# Patient Record
Sex: Male | Born: 1935
Health system: Southern US, Community
[De-identification: ages and names within clinical notes are randomized; demographics above are authoritative.]

## PROBLEM LIST (undated history)

## (undated) DIAGNOSIS — K219 Gastro-esophageal reflux disease without esophagitis: Secondary | ICD-10-CM

## (undated) DIAGNOSIS — G629 Polyneuropathy, unspecified: Secondary | ICD-10-CM

## (undated) DIAGNOSIS — M1990 Unspecified osteoarthritis, unspecified site: Secondary | ICD-10-CM

## (undated) DIAGNOSIS — I2510 Atherosclerotic heart disease of native coronary artery without angina pectoris: Secondary | ICD-10-CM

## (undated) DIAGNOSIS — N529 Male erectile dysfunction, unspecified: Secondary | ICD-10-CM

## (undated) DIAGNOSIS — C801 Malignant (primary) neoplasm, unspecified: Secondary | ICD-10-CM

## (undated) DIAGNOSIS — R319 Hematuria, unspecified: Secondary | ICD-10-CM

## (undated) DIAGNOSIS — E119 Type 2 diabetes mellitus without complications: Secondary | ICD-10-CM

## (undated) DIAGNOSIS — D649 Anemia, unspecified: Secondary | ICD-10-CM

## (undated) DIAGNOSIS — R42 Dizziness and giddiness: Secondary | ICD-10-CM

## (undated) DIAGNOSIS — E785 Hyperlipidemia, unspecified: Secondary | ICD-10-CM

## (undated) DIAGNOSIS — I219 Acute myocardial infarction, unspecified: Secondary | ICD-10-CM

## (undated) DIAGNOSIS — I509 Heart failure, unspecified: Secondary | ICD-10-CM

## (undated) DIAGNOSIS — N189 Chronic kidney disease, unspecified: Secondary | ICD-10-CM

## (undated) DIAGNOSIS — W57XXXA Bitten or stung by nonvenomous insect and other nonvenomous arthropods, initial encounter: Secondary | ICD-10-CM

## (undated) DIAGNOSIS — R339 Retention of urine, unspecified: Secondary | ICD-10-CM

## (undated) DIAGNOSIS — M75100 Unspecified rotator cuff tear or rupture of unspecified shoulder, not specified as traumatic: Secondary | ICD-10-CM

## (undated) DIAGNOSIS — N400 Enlarged prostate without lower urinary tract symptoms: Secondary | ICD-10-CM

## (undated) DIAGNOSIS — G459 Transient cerebral ischemic attack, unspecified: Secondary | ICD-10-CM

## (undated) DIAGNOSIS — R310 Gross hematuria: Secondary | ICD-10-CM

## (undated) DIAGNOSIS — R3129 Other microscopic hematuria: Secondary | ICD-10-CM

## (undated) DIAGNOSIS — N5319 Other ejaculatory dysfunction: Secondary | ICD-10-CM

## (undated) DIAGNOSIS — I10 Essential (primary) hypertension: Secondary | ICD-10-CM

## (undated) DIAGNOSIS — R351 Nocturia: Secondary | ICD-10-CM

## (undated) DIAGNOSIS — IMO0001 Reserved for inherently not codable concepts without codable children: Secondary | ICD-10-CM

## (undated) DIAGNOSIS — I519 Heart disease, unspecified: Secondary | ICD-10-CM

## (undated) HISTORY — PX: ROTATOR CUFF REPAIR: SHX139

## (undated) HISTORY — PX: OTHER SURGICAL HISTORY: SHX169

## (undated) HISTORY — DX: Other ejaculatory dysfunction: N53.19

## (undated) HISTORY — DX: Retention of urine, unspecified: R33.9

## (undated) HISTORY — DX: Benign prostatic hyperplasia without lower urinary tract symptoms: N40.0

## (undated) HISTORY — DX: Nocturia: R35.1

## (undated) HISTORY — DX: Anemia, unspecified: D64.9

## (undated) HISTORY — DX: Type 2 diabetes mellitus without complications: E11.9

## (undated) HISTORY — DX: Hyperlipidemia, unspecified: E78.5

## (undated) HISTORY — DX: Male erectile dysfunction, unspecified: N52.9

## (undated) HISTORY — PX: CORONARY ANGIOPLASTY: SHX604

## (undated) HISTORY — DX: Reserved for inherently not codable concepts without codable children: IMO0001

## (undated) HISTORY — DX: Essential (primary) hypertension: I10

## (undated) HISTORY — DX: Unspecified rotator cuff tear or rupture of unspecified shoulder, not specified as traumatic: M75.100

## (undated) HISTORY — DX: Other microscopic hematuria: R31.29

## (undated) HISTORY — DX: Heart disease, unspecified: I51.9

## (undated) HISTORY — DX: Gross hematuria: R31.0

## (undated) HISTORY — DX: Hematuria, unspecified: R31.9

## (undated) HISTORY — DX: Heart failure, unspecified: I50.9

---

## 2006-03-16 ENCOUNTER — Inpatient Hospital Stay: Payer: Self-pay | Admitting: Cardiovascular Disease

## 2006-03-16 ENCOUNTER — Other Ambulatory Visit: Payer: Self-pay

## 2006-04-08 ENCOUNTER — Encounter: Payer: Self-pay | Admitting: Cardiovascular Disease

## 2006-04-18 ENCOUNTER — Encounter: Payer: Self-pay | Admitting: Cardiovascular Disease

## 2006-05-19 ENCOUNTER — Encounter: Payer: Self-pay | Admitting: Cardiovascular Disease

## 2006-06-18 ENCOUNTER — Encounter: Payer: Self-pay | Admitting: Cardiovascular Disease

## 2006-06-25 ENCOUNTER — Ambulatory Visit: Payer: Self-pay | Admitting: Gastroenterology

## 2007-08-05 ENCOUNTER — Ambulatory Visit: Payer: Self-pay | Admitting: Cardiovascular Disease

## 2007-08-12 ENCOUNTER — Inpatient Hospital Stay: Payer: Self-pay | Admitting: Cardiovascular Disease

## 2007-08-12 ENCOUNTER — Other Ambulatory Visit: Payer: Self-pay

## 2009-01-17 ENCOUNTER — Ambulatory Visit: Payer: Self-pay | Admitting: Gastroenterology

## 2011-04-22 ENCOUNTER — Ambulatory Visit: Payer: Self-pay | Admitting: Rheumatology

## 2011-06-20 ENCOUNTER — Ambulatory Visit: Payer: Self-pay | Admitting: Unknown Physician Specialty

## 2011-06-20 LAB — BASIC METABOLIC PANEL
Anion Gap: 6 — ABNORMAL LOW (ref 7–16)
Calcium, Total: 9.2 mg/dL (ref 8.5–10.1)
Chloride: 104 mmol/L (ref 98–107)
Co2: 28 mmol/L (ref 21–32)
Creatinine: 0.84 mg/dL (ref 0.60–1.30)
EGFR (African American): >60
EGFR (Non-African Amer.): >60
Osmolality: 285 (ref 275–301)
Potassium: 4.3 mmol/L (ref 3.5–5.1)

## 2011-06-20 LAB — CBC
HCT: 38.6 % — ABNORMAL LOW (ref 40.0–52.0)
HGB: 13.5 g/dL (ref 13.0–18.0)
MCH: 33.5 pg (ref 26.0–34.0)
MCHC: 34.9 g/dL (ref 32.0–36.0)
Platelet: 192 10*3/uL (ref 150–440)
RBC: 4.01 10*6/uL — ABNORMAL LOW (ref 4.40–5.90)
RDW: 13 % (ref 11.5–14.5)

## 2011-06-23 ENCOUNTER — Ambulatory Visit: Payer: Self-pay | Admitting: Unknown Physician Specialty

## 2012-04-16 ENCOUNTER — Ambulatory Visit: Payer: Self-pay | Admitting: Unknown Physician Specialty

## 2012-11-11 ENCOUNTER — Ambulatory Visit: Payer: Self-pay | Admitting: Urology

## 2013-10-21 ENCOUNTER — Ambulatory Visit (INDEPENDENT_AMBULATORY_CARE_PROVIDER_SITE_OTHER): Payer: Medicare Other | Admitting: Podiatry

## 2013-10-21 ENCOUNTER — Ambulatory Visit (INDEPENDENT_AMBULATORY_CARE_PROVIDER_SITE_OTHER): Payer: Medicare Other

## 2013-10-21 ENCOUNTER — Encounter: Payer: Self-pay | Admitting: Podiatry

## 2013-10-21 VITALS — BP 150/83 | HR 61 | Resp 16 | Ht 68.0 in | Wt 170.0 lb

## 2013-10-21 DIAGNOSIS — M722 Plantar fascial fibromatosis: Secondary | ICD-10-CM

## 2013-10-21 DIAGNOSIS — L923 Foreign body granuloma of the skin and subcutaneous tissue: Secondary | ICD-10-CM

## 2013-10-21 MED ORDER — TRIAMCINOLONE ACETONIDE 10 MG/ML IJ SUSP: 10.0000 mg | Freq: Once | INTRAMUSCULAR | Status: DC

## 2013-10-21 NOTE — Progress Notes (Signed)
   Subjective:    Patient ID: Gregory Tran, male    DOB: 1935/06/16, 78 y.o.   MRN: [id number]  HPI Comments: i had a blister in my arch on the left foot. Its tender with pressure. Its been bothering me for a couple of months. My wife used a needle on it to see if she could find anything. Its remained the same, no worse.   Foot Pain      Review of Systems  HENT:       Ringing in ears  Endocrine: Positive for cold intolerance.       Increase urination   Genitourinary: Positive for urgency and difficulty urinating.  Musculoskeletal:       Back pain Muscle pain   Neurological: Positive for light-headedness.  Hematological: Bruises/bleeds easily.  All other systems reviewed and are negative.      Objective:   Physical Exam        Assessment & Plan:

## 2013-10-21 NOTE — Progress Notes (Signed)
Subjective:     Patient ID: Gregory Tran, male   DOB: Jun 19, 1935, 78 y.o.   MRN: [id number]  Foot Pain   patient states that he had a small blister in the bottom of his left foot and arch that did fine but it's left him with some pain in the arch and it's been present for several months. There's been no drainage redness or swelling   Review of Systems  All other systems reviewed and are negative.      Objective:   Physical Exam  Nursing note and vitals reviewed. Constitutional: He is oriented to person, place, and time.  Cardiovascular: Intact distal pulses.   Musculoskeletal: Normal range of motion.  Neurological: He is oriented to person, place, and time.  Skin: Skin is warm.   neurovascular status intact with muscle strength adequate and range of motion within normal limits. I checked and found that the digits are well-perfused and that the sharp dull and vibratory is intact area patient is noted to have discomfort in the left arch in the mid arch area with a small area where there had been some kind of trauma but there is no drainage edema erythema surrounding    Assessment:     Probable plantar fasciitis and cannot rule out foreign body    Plan:     H&P and x-ray reviewed and today I did a careful steroidal injection 2 mg Kenalog 5 mg Xylocaine and advised on physical therapy for the area. If pain should persist or other problems patient is to let us know

## 2014-03-14 ENCOUNTER — Ambulatory Visit: Payer: Self-pay | Admitting: Internal Medicine

## 2014-04-10 DIAGNOSIS — M5440 Lumbago with sciatica, unspecified side: Secondary | ICD-10-CM | POA: Insufficient documentation

## 2014-04-25 ENCOUNTER — Ambulatory Visit: Payer: Self-pay | Admitting: Unknown Physician Specialty

## 2014-06-11 NOTE — Op Note (Signed)
PATIENT NAME:  Gregory Tran, Gregory Tran MR#:  [id number] DATE OF BIRTH:  Jun 29, 1935  DATE OF PROCEDURE:  06/23/2011  PREOPERATIVE DIAGNOSIS: Torn rotator cuff, right shoulder, with bicipital tendinitis.  POSTOPERATIVE DIAGNOSES: Torn rotator cuff, right shoulder, with bicipital tendinitis and secondary impingement and degenerative labral tear.   PROCEDURES: Arthroscopic subacromial decompression, release of the long head of the biceps tendon, and debridement of the posterior labral tear followed by mini incision rotator cuff repair and xenograft augmentation   SURGEON: Kathrene Alu., M.D.   ANESTHESIA: General.   HISTORY: The patient had a fairly long history of right shoulder pain and weakness. Plain films did not reveal any significant abnormality but a MRI was consistent with a large rotator cuff tear. The long head of the biceps tendon was also thought to be frayed. The patient was ultimately brought in for surgery due to his persistent symptoms.   DESCRIPTION OF PROCEDURE: The patient was taken the Operating Room where satisfactory general anesthesia was achieved. The patient was turned to the lateral decubitus position with the right shoulder up. The right shoulder was prepped and draped in the usual fashion for a procedure about the shoulder. The right upper extremity was suspended with the Acufex shoulder suspension device. We used 10 pounds of traction initially and then reduced it to 5 later on during the procedure.   The patient incidentally was given 2 grams Kefzol IV prior to the start of the procedure.   The scope was then introduced through a posterior portal into the glenohumeral joint. The joint was distended with lactated Ringer's. We used the Mitek fluid pump to facilitate joint distention.   Inspection of the glenohumeral joint revealed the articular surfaces were reasonably smooth. The labrum was frayed posteriorly. There was fraying of the long head of the biceps  tendon. A fairly significant rotator cuff tear was appreciated in the area of the supraspinatus and infraspinatus.   An anterior portal was established from outside in. I introduced an ArthroCare saber wand through this portal and used it to divide the long head of the biceps tendon attachment to the labrum. I also introduced a shaver to debride the frayed undersurface of the rotator cuff. I then switched the scope anteriorly and brought a shaver in through the posterior portal to debride the frayed posterior labral tear.   The scope was then switched back to the posterior portal and introduced in the glenohumeral joint. Inspection of this joint revealed a large cuff tear that involved the supraspinatus and infraspinatus. A lateral portal was established. I introduced a shaver to debride the thickened bursal tissue and the torn cuff itself. I also debrided the frayed tissue on the undersurface of the acromion. The patient did have a bony prominence on the inferior aspect of the anterior acromion. I used an ArthroCare wand to further debride the greater tuberosity and the undersurface of the acromion. In regard to the greater tuberosity a large round bur was used to lightly decorticate this area and then I switched the scope to the lateral portal and brought in an acromionizer bur through the posterior portal and performed a subacromial decompression. The acromial attachment of the coracoacromial ligament was divided at this time.   I then went ahead and freed the rotator cuff from the surrounding soft tissue. Superiorly, I freed it with an elevator and inferiorly I used a Naval architect wand. I still could not advance the cuff as far laterally as I wanted so I went ahead  and removed the scope and enlarged the lateral puncture wound proximally to the edge of the acromion. I divided the deltoid muscle in line with the incision and in line with its fibers. A portion of the anterior deltoid was dissected off  of the acromion and a portion of the posterior deltoid was also dissected off the acromion. A Gelpi retractor was inserted. The patient indeed had a large cuff tear that was significantly retracted.   I ultimately was able to perform a margin convergence repair using several #2 Magnum wire sutures. I then placed two additional sutures into the repaired cuff in inverted horizontal mattress fashion.Two speed screws were inserted and, the inverted mattress sutures were tightened down to them. I then used a small K wire to make multiple holes in the greater tuberosity in a microfracture-type fashion. I then reinforced the repair with a Conexa Xenograft. It was anchored medially into the cuff remnant with two #2 nonabsorbable sutures and laterally it was anchored to the greater tuberosity with two ArthroCare Spartan anchors with #2 sutures that were passed through the cuff in horizontal mattress fashion.   The wound was irrigated with GU irrigant. I reattached the anterior deltoid back to the acromion through a drill hole with a #1 Ethibond suture. The rotator interval was closed with 0 Vicryl, the subcutaneous with 2-0 Vicryl, and the skin with skin staples. The posterior and anterior puncture wounds were closed with 3-0 nylon in vertical mattress fashion. Several milliliters of 0.5% Marcaine with epinephrine was injected about each puncture wound and then 5 to 6 mL were injected into the incision and then another 7 or 8 mL were injected into the subacromial space.   A total of about 30 mL of 0.5% Marcaine with epinephrine was used.   Betadine was applied to the wounds followed by 4 TENS pads that were placed about the wound. Sterile dressing was applied followed by an immobilizer.   The patient was then turned supine and awakened. He was transferred to a stretcher bed. He was taken to the recovery room in satisfactory condition. Blood loss was negligible.  ____________________________ Kathrene Alu., MD hbk:slb D: 06/24/2011 13:13:43 ET T: 06/24/2011 13:59:59 ET JOB#: [id number]  cc: Kathrene Alu., MD, <Dictator> Vilinda Flake, Brooke Bonito MD ELECTRONICALLY SIGNED 07/28/2011 18:16

## 2014-10-05 ENCOUNTER — Encounter: Payer: Self-pay | Admitting: *Deleted

## 2014-10-16 ENCOUNTER — Encounter: Payer: Self-pay | Admitting: Urology

## 2014-10-16 ENCOUNTER — Ambulatory Visit (INDEPENDENT_AMBULATORY_CARE_PROVIDER_SITE_OTHER): Payer: Medicare Other | Admitting: Urology

## 2014-10-16 VITALS — BP 163/72 | HR 73 | Ht 68.0 in | Wt 176.0 lb

## 2014-10-16 DIAGNOSIS — N401 Enlarged prostate with lower urinary tract symptoms: Secondary | ICD-10-CM | POA: Diagnosis not present

## 2014-10-16 DIAGNOSIS — N138 Other obstructive and reflux uropathy: Secondary | ICD-10-CM | POA: Insufficient documentation

## 2014-10-16 DIAGNOSIS — N508 Other specified disorders of male genital organs: Secondary | ICD-10-CM

## 2014-10-16 DIAGNOSIS — N5319 Other ejaculatory dysfunction: Secondary | ICD-10-CM

## 2014-10-16 LAB — BLADDER SCAN AMB NON-IMAGING

## 2014-10-16 MED ORDER — TADALAFIL 5 MG PO TABS: 5.0000 mg | ORAL_TABLET | Freq: Every day | ORAL | Status: DC | PRN

## 2014-10-16 NOTE — Progress Notes (Signed)
10/16/2014 8:13 PM   Gregory Tran 03-18-1935 [id number]  Referring provider: No referring provider defined for this encounter.  Chief Complaint  Patient presents with  . Benign Prostatic Hypertrophy    4month with Uroflow &PVR    HPI: Patient is a 79 year old white male who presents today for a 4 month follow-up. At his previous visits with Dr. Erlene Quan, he underwent cystoscopy and TRUS. He was found to have an obstructive appearing prostate with trilobar coaptation and a median lobe.  They had discussed a possible outlet procedure with HoLEP/HoLAP, but he is hesitant to undergo surgery.  His IPSS score today is 17, which is moderate lower urinary tract symptomatology. He feels terrible with his quality life due to his urinary symptoms. His PVR is 0 mL.   His previous PVR is 187 mL.    His major complaint today having to sit to urinate due to the spraying of his urinary stream and a dry ejaculate. He has had these symptoms for several months.  He denies any dysuria, hematuria or suprapubic pain.   He currently taking tamsulosin and finasteride.  He also denies any recent fevers, chills, nausea or vomiting.  He does not have a family history of PCa.      IPSS      10/16/14 1400       International Prostate Symptom Score   How often have you had the sensation of not emptying your bladder? Less than half the time     How often have you had to urinate less than every two hours? Less than half the time     How often have you found you stopped and started again several times when you urinated? Less than half the time     How often have you found it difficult to postpone urination? About half the time     How often have you had a weak urinary stream? More than half the time     How often have you had to strain to start urination? Less than 1 in 5 times     How many times did you typically get up at night to urinate? 3 Times     Total IPSS Score 17     Quality of Life  due to urinary symptoms   If you were to spend the rest of your life with your urinary condition just the way it is now how would you feel about that? Terrible        Score:  1-7 Mild 8-19 Moderate 20-35 Severe      PMH: Past Medical History  Diagnosis Date  . Heart disease   . Hematuria   . Diabetes mellitus   . HTN (hypertension)   . Hyperlipidemia   . Frequency   . BPH (benign prostatic hyperplasia)   . Microscopic hematuria   . Ejaculatory disorder   . Nocturia   . Gross hematuria   . Incomplete bladder emptying   . Erectile dysfunction     Surgical History: Past Surgical History  Procedure Laterality Date  . Rotator cuff repair Bilateral   . Cardiac stents      Home Medications:    Medication List       This list is accurate as of: 10/16/14  8:13 PM.  Always use your most recent med list.               amLODipine 5 MG tablet  Commonly known as:  NORVASC  TK 1 T  PO ONCE A DAY     aspirin EC 81 MG tablet  Take by mouth.     atorvastatin 20 MG tablet  Commonly known as:  LIPITOR  Take by mouth.     clopidogrel 75 MG tablet  Commonly known as:  PLAVIX  Take by mouth.     finasteride 5 MG tablet  Commonly known as:  PROSCAR     IRON PO  Take by mouth daily.     lisinopril 10 MG tablet  Commonly known as:  PRINIVIL,ZESTRIL  Take by mouth.     metFORMIN 1000 MG tablet  Commonly known as:  GLUCOPHAGE  Take by mouth.     metoprolol succinate 50 MG 24 hr tablet  Commonly known as:  TOPROL-XL  Take by mouth.     NOVOLOG FLEXPEN 100 UNIT/ML FlexPen  Generic drug:  insulin aspart  INJ 10 UNITS Aurora TID     pantoprazole 40 MG tablet  Commonly known as:  PROTONIX     tadalafil 5 MG tablet  Commonly known as:  CIALIS  Take 1 tablet (5 mg total) by mouth daily as needed for erectile dysfunction.     tamsulosin 0.4 MG Caps capsule  Commonly known as:  FLOMAX  Take by mouth.        Allergies: No Known Allergies  Family  History: Family History  Problem Relation Age of Onset  . Ovarian cancer Sister   . Bladder Cancer Neg Hx   . Prostate cancer Neg Hx     Social History:  reports that he has been smoking.  He does not have any smokeless tobacco history on file. He reports that he drinks alcohol. His drug history is not on file.  ROS: UROLOGY Frequent Urination?: Yes Hard to postpone urination?: Yes Burning/pain with urination?: No Get up at night to urinate?: Yes Leakage of urine?: Yes Urine stream starts and stops?: Yes Trouble starting stream?: Yes Do you have to strain to urinate?: No Blood in urine?: No Urinary tract infection?: No Sexually transmitted disease?: No Injury to kidneys or bladder?: No Painful intercourse?: No Weak stream?: Yes Erection problems?: No Penile pain?: No  Gastrointestinal Nausea?: No Vomiting?: No Indigestion/heartburn?: No Diarrhea?: No Constipation?: No  Constitutional Fever: No Night sweats?: No Weight loss?: No Fatigue?: No  Skin Skin rash/lesions?: No Itching?: No  Eyes Blurred vision?: No Double vision?: No  Ears/Nose/Throat Sore throat?: No Sinus problems?: No  Hematologic/Lymphatic Swollen glands?: No Easy bruising?: Yes  Cardiovascular Leg swelling?: No Chest pain?: No  Respiratory Cough?: No Shortness of breath?: No  Endocrine Excessive thirst?: No  Musculoskeletal Back pain?: Yes Joint pain?: No  Neurological Headaches?: No Dizziness?: No  Psychologic Depression?: No Anxiety?: No  Physical Exam: BP 163/72 mmHg  Pulse 73  Ht 5\' 8"  (1.727 m)  Wt 176 lb (79.833 kg)  BMI 26.77 kg/m2  GU: Patient with uncircumcised phallus. Foreskin easily retracted  Urethral meatus is patent.  No penile discharge. No penile lesions or rashes. Scrotum without lesions, cysts, rashes and/or edema.  Testicles are located scrotally bilaterally. No masses are appreciated in the testicles. Left and right epididymis are  normal. Rectal: Patient with  normal sphincter tone. Perineum without scarring or rashes. No rectal masses are appreciated. Prostate is approximately 55  grams, no nodules are appreciated. Seminal vesicles are normal.   Laboratory Data:  Lab Results  Component Value Date   CREATININE 0.84 06/20/2011    PSA history:  2.6 ng/mL on 10/27/2012  0.7 ng/mL on 07/27/2013             0.6 ng/mL on 01/26/2014   Pertinent Imaging: Uroflow is consistent with an obstructive pattern.   Q-max= 13.0 mL/s                                                                                  Q-ave= 9.5 mL/s                                                                                  Voided volume=254.0 mL                                                                                  T total= 32.2 s                                                                                  T flow= 26.6 s                                                                                  T Qmax= 5.7 s                          Results for orders placed or performed in visit on 10/16/14  BLADDER SCAN AMB NON-IMAGING  Result Value Ref Range   Scan Result 28ml      Assessment & Plan:    1. BPH (benign prostatic hyperplasia) with LUTS:   Patient's IPSS score is 17/6.  His PVR 0 mL.  His DRE demonstrates enlargement, no nodules.  Patient is very irritated with his symptom of dry ejaculate. He  realizes its from the tamsulosin, but he is wondering if any other medications could be used for his BPH and obstructive voiding symptoms.  I have suggested Cialis 5 mg, but I advised him that he may find it cost prohibitive  as most insurance plans are reluctant to cover this medication.  He is warned not to take the Cialis with medications that contain nitrates.  I also advised him of the side effects, such as: headache, flushing, dyspepsia, abnormal vision, nasal congestion, back pain, myalgia, nausea, dizziness, and  rash.   He would like to try the Cialis.  I have sent a prescription in to his pharmacy and have given him a coupon to help with the cost of the medication. He will follow up in 4 months for a  DRE, PVR and an IPSS.    - PR COMPLEX UROFLOWMETRY - BLADDER SCAN AMB NON-IMAGING  2. Ejaculatory disorder:   The patient is experiencing the side effect of ejaculatory disorder with his tamsulosin medication. His PVR is 0 mL today, so I believe the tamsulosin is aiding in the emptying of his bladder.   I will have the patient discontinue the tamsulosin and start the Cialis 5 mg daily.  This will hopefully continue to aid in the emptying of the bladder with out the side effects of a dry ejaculate.  He will follow-up in 4 months time for symptom recheck and PVR.   Return in about 4 months (around 02/15/2015) for IPSS and PVR .  Zara Council, [facility] [address] [place], [place] [address] [phone]

## 2014-10-16 NOTE — Progress Notes (Signed)
Uroflow  Peak Flow: 13.22ml Average Flow: 9.27ml Voided Volume: 236ml Voiding Time: 32.2sec Flow Time: 26.6sec Time to Peak Flow: 5.7sec  PVR Volume: 46ml

## 2015-01-21 ENCOUNTER — Other Ambulatory Visit: Payer: Self-pay | Admitting: Urology

## 2015-01-21 DIAGNOSIS — N400 Enlarged prostate without lower urinary tract symptoms: Secondary | ICD-10-CM

## 2015-01-23 DIAGNOSIS — M25519 Pain in unspecified shoulder: Secondary | ICD-10-CM | POA: Insufficient documentation

## 2015-01-26 ENCOUNTER — Telehealth: Payer: Self-pay

## 2015-01-26 DIAGNOSIS — N400 Enlarged prostate without lower urinary tract symptoms: Secondary | ICD-10-CM

## 2015-01-26 MED ORDER — FINASTERIDE 5 MG PO TABS: 5.0000 mg | ORAL_TABLET | Freq: Every day | ORAL | Status: DC

## 2015-01-26 NOTE — Telephone Encounter (Signed)
Pt pharmacy sent refill request on finasteride. A 79mo refill was given to last until jan appt.

## 2015-02-20 ENCOUNTER — Telehealth: Payer: Self-pay | Admitting: Urology

## 2015-02-20 ENCOUNTER — Ambulatory Visit (INDEPENDENT_AMBULATORY_CARE_PROVIDER_SITE_OTHER): Payer: Medicare Other | Admitting: Urology

## 2015-02-20 ENCOUNTER — Encounter: Payer: Self-pay | Admitting: Urology

## 2015-02-20 VITALS — BP 156/70 | HR 58 | Ht 68.0 in | Wt 178.0 lb

## 2015-02-20 DIAGNOSIS — R4189 Other symptoms and signs involving cognitive functions and awareness: Secondary | ICD-10-CM | POA: Diagnosis not present

## 2015-02-20 DIAGNOSIS — N401 Enlarged prostate with lower urinary tract symptoms: Secondary | ICD-10-CM

## 2015-02-20 DIAGNOSIS — N138 Other obstructive and reflux uropathy: Secondary | ICD-10-CM

## 2015-02-20 LAB — BLADDER SCAN AMB NON-IMAGING: SCAN RESULT: 98

## 2015-02-20 MED ORDER — TADALAFIL 5 MG PO TABS: 5.0000 mg | ORAL_TABLET | Freq: Every day | ORAL | Status: DC | PRN

## 2015-02-20 NOTE — Telephone Encounter (Signed)
Patient has changed PCP's.  He no longer sees Dr. Rosario Jacks.  He has moved to [facility] in [place].  His new PCP,  [name].  Would you please send his note from today's visit (02/20/2015) to her?  And can you change it in his chart?

## 2015-02-20 NOTE — Progress Notes (Signed)
9:20 AM   Gregory Tran 1935-12-18 [id number]  Referring provider: Casilda Carls, MD [address]   [place], [place] [address]  Chief Complaint  Patient presents with  . Benign Prostatic Hypertrophy    follow up    HPI: Patient is a 80 year old Caucasian male with BPH with LUTS who presets today for a follow up after a trial of Cialis 5 mg daily.    Previous history Patient underwent a cystoscopy and TRUS in 05/2014 with Dr. Erlene Quan.  He was found to have an obstructive appearing prostate with trilobar coaptation and a median lobe.  They had discussed a possible outlet procedure with HoLEP/HoLAP, but he is hesitant to undergo surgery.  His IPSS score today is 22, which is severe lower urinary tract symptomatology. He feels mostly dissatisfied with his quality life due to his urinary symptoms. His PVR is 98 mL.   His major complaint today is urinary frequency.   He has had these symptoms for several months.  He denies any dysuria, hematuria or suprapubic pain.  He currently taking tamsulosin and finasteride, he did not pick up the Cialis prescription.  He also denies any recent fevers, chills, nausea or vomiting.  He does not have a family history of PCa.      IPSS      02/20/15 0900       International Prostate Symptom Score   How often have you had the sensation of not emptying your bladder? Less than half the time     How often have you had to urinate less than every two hours? About half the time     How often have you found you stopped and started again several times when you urinated? About half the time     How often have you found it difficult to postpone urination? More than half the time     How often have you had a weak urinary stream? More than half the time     How often have you had to strain to start urination? More than half the time     How many times did you typically get up at night to urinate? 2 Times     Total IPSS Score 22     Quality of Life  due to urinary symptoms   If you were to spend the rest of your life with your urinary condition just the way it is now how would you feel about that? Mostly Disatisfied        Score:  1-7 Mild 8-19 Moderate 20-35 Severe  Patient exhibitive some cognitive issues during our appointment today.  He states the reason he did not pick up the Cialis is that it was not called in to the pharmacy.  I did check the records and it was escribed to his pharmacy.  He asked me about topics he thought we discussed during his visit which we did not.    PMH: Past Medical History  Diagnosis Date  . Heart disease   . Hematuria   . Diabetes mellitus ([place])   . HTN (hypertension)   . Hyperlipidemia   . Frequency   . BPH (benign prostatic hyperplasia)   . Microscopic hematuria   . Ejaculatory disorder   . Nocturia   . Gross hematuria   . Incomplete bladder emptying   . Erectile dysfunction     Surgical History: Past Surgical History  Procedure Laterality Date  . Rotator cuff repair Bilateral   .  Cardiac stents      Home Medications:    Medication List       This list is accurate as of: 02/20/15  9:20 AM.  Always use your most recent med list.               amLODipine 5 MG tablet  Commonly known as:  NORVASC  TK 1 T PO ONCE A DAY     aspirin EC 81 MG tablet  Take by mouth.     atorvastatin 20 MG tablet  Commonly known as:  LIPITOR  Take by mouth.     clopidogrel 75 MG tablet  Commonly known as:  PLAVIX  Take by mouth.     finasteride 5 MG tablet  Commonly known as:  PROSCAR  Take 1 tablet (5 mg total) by mouth daily.     IRON PO  Take by mouth daily.     LEVEMIR FLEXTOUCH 100 UNIT/ML Pen  Generic drug:  Insulin Detemir  INJECT 35 UNITS QAM AND 15 UNITS QPM     levocetirizine 5 MG tablet  Commonly known as:  XYZAL  Take by mouth. Reported on 02/20/2015     lisinopril 10 MG tablet  Commonly known as:  PRINIVIL,ZESTRIL  Take by mouth. Reported on 02/20/2015      metFORMIN 1000 MG tablet  Commonly known as:  GLUCOPHAGE  Take by mouth.     metoprolol succinate 50 MG 24 hr tablet  Commonly known as:  TOPROL-XL  Take by mouth.     NOVOLOG FLEXPEN 100 UNIT/ML FlexPen  Generic drug:  insulin aspart  INJ 10 UNITS [place] TID     pantoprazole 40 MG tablet  Commonly known as:  PROTONIX     tadalafil 5 MG tablet  Commonly known as:  CIALIS  Take 1 tablet (5 mg total) by mouth daily as needed for erectile dysfunction.     tamsulosin 0.4 MG Caps capsule  Commonly known as:  FLOMAX  Take by mouth.     vitamin B-1 250 MG tablet  Take by mouth.        Allergies:  Allergies  Allergen Reactions  . B Complex Formula 1     Family History: Family History  Problem Relation Age of Onset  . Ovarian cancer Sister   . Bladder Cancer Neg Hx   . Prostate cancer Neg Hx   . Kidney disease Brother     born one kidney    Social History:  reports that he has been smoking.  He does not have any smokeless tobacco history on file. He reports that he drinks alcohol. He reports that he does not use illicit drugs.  ROS: UROLOGY Frequent Urination?: No Hard to postpone urination?: Yes Burning/pain with urination?: No Get up at night to urinate?: Yes Leakage of urine?: No Urine stream starts and stops?: No Trouble starting stream?: Yes Do you have to strain to urinate?: No Blood in urine?: No Urinary tract infection?: No Sexually transmitted disease?: No Injury to kidneys or bladder?: No Painful intercourse?: No Weak stream?: No Erection problems?: No Penile pain?: No  Gastrointestinal Nausea?: No Vomiting?: No Indigestion/heartburn?: No Diarrhea?: No Constipation?: No  Constitutional Fever: No Night sweats?: No Weight loss?: No Fatigue?: No  Skin Skin rash/lesions?: No Itching?: No  Eyes Blurred vision?: No Double vision?: No  Ears/Nose/Throat Sore throat?: No Sinus problems?: No  Hematologic/Lymphatic Swollen glands?:  No Easy bruising?: No  Cardiovascular Leg swelling?: No Chest pain?: No  Respiratory Cough?:  No Shortness of breath?: No  Endocrine Excessive thirst?: No  Musculoskeletal Back pain?: No Joint pain?: No  Neurological Headaches?: No Dizziness?: No  Psychologic Depression?: No Anxiety?: No  Physical Exam: BP 156/70 mmHg  Pulse 58  Ht 5\' 8"  (1.727 m)  Wt 178 lb (80.74 kg)  BMI 27.07 kg/m2  GU: Patient with uncircumcised phallus. Foreskin easily retracted  Urethral meatus is patent.  No penile discharge. No penile lesions or rashes. Scrotum without lesions, cysts, rashes and/or edema.  Testicles are located scrotally bilaterally. Left spermatocele is noted.   Left and right epididymis are normal. Rectal: Patient with  normal sphincter tone. Perineum without scarring or rashes. No rectal masses are appreciated. Prostate is approximately 55  grams, no nodules are appreciated. Seminal vesicles are normal.   Laboratory Data:  Lab Results  Component Value Date   CREATININE 0.84 06/20/2011    PSA history:  2.6 ng/mL on 10/27/2012             0.7 ng/mL on 07/27/2013             0.6 ng/mL on 01/26/2014   Pertinent Imaging:                      Results for orders placed or performed in visit on 02/20/15  BLADDER SCAN AMB NON-IMAGING  Result Value Ref Range   Scan Result 98      Assessment & Plan:    1. BPH (benign prostatic hyperplasia) with LUTS:   Patient's IPSS score is 22/4.  His PVR 98 mL.  His DRE demonstrates enlargement, no nodules.  Patient is very irritated with his symptoms of urinary frequency.    I explained to him that he was on maximum medical therapy with the tamsulosin and the finasteride.  He would like to try the Cialis 5 mg, but I advised him that he may find it cost prohibitive as most insurance plans are reluctant to cover this medication.  He is warned not to take the Cialis with medications that contain nitrates.  I also advised him of the side  effects, such as: headache, flushing, dyspepsia, abnormal vision, nasal congestion, back pain, myalgia, nausea, dizziness, and rash.    I have sent a prescription in to his pharmacy.  He will follow up in one month for an IPSS and PVR.   - BLADDER SCAN AMB NON-IMAGING  2. Cognitive change:   Patient demonstrated some memory issues during our appointment today.  He has a new PCP, Juluis Rainier, FNP,  at [facility] and we will send this note to her so she may be able to evaluate it further.    Return in about 1 month (around 03/23/2015) for PVR and IPSS.  Zara Council, [facility] [address], [place], [place] [address] [phone]

## 2015-03-06 NOTE — Telephone Encounter (Signed)
Done ° ° °Gregory Tran °

## 2015-03-06 NOTE — Telephone Encounter (Signed)
Patient has changed PCP's. He no longer sees Dr. Rosario Jacks. He has moved to [facility] in [place]. His new PCP, [name]. Would you please send his note from today's visit (02/20/2015) to her? And can you change it in his chart?

## 2015-03-23 ENCOUNTER — Ambulatory Visit (INDEPENDENT_AMBULATORY_CARE_PROVIDER_SITE_OTHER): Payer: Medicare Other | Admitting: Urology

## 2015-03-23 ENCOUNTER — Encounter: Payer: Self-pay | Admitting: Urology

## 2015-03-23 VITALS — BP 170/82 | HR 61 | Ht 68.0 in | Wt 179.4 lb

## 2015-03-23 DIAGNOSIS — N401 Enlarged prostate with lower urinary tract symptoms: Secondary | ICD-10-CM | POA: Diagnosis not present

## 2015-03-23 DIAGNOSIS — N138 Other obstructive and reflux uropathy: Secondary | ICD-10-CM

## 2015-03-23 DIAGNOSIS — R4189 Other symptoms and signs involving cognitive functions and awareness: Secondary | ICD-10-CM | POA: Diagnosis not present

## 2015-03-23 LAB — BLADDER SCAN AMB NON-IMAGING: SCAN RESULT: 88

## 2015-03-23 NOTE — Progress Notes (Signed)
9:12 AM   Gregory Tran 11-15-1935 [id number]  Referring provider: Casilda Carls, MD [address]   [place], [place] [address]  Chief Complaint  Patient presents with  . Benign Prostatic Hypertrophy    follow up 1 month     HPI: Patient is a 80 year old Caucasian male with BPH with LUTS who presets today for a follow up after a trial of Cialis 5 mg daily.    Previous history Patient underwent a cystoscopy and TRUS in 05/2014 with Dr. Erlene Quan.  He was found to have an obstructive appearing prostate with trilobar coaptation and a median lobe.  They had discussed a possible outlet procedure with HoLEP/HoLAP, but he is hesitant to undergo surgery.  His IPSS score today is 26, which is severe lower urinary tract symptomatology. He feels mostly dissatisfied with his quality life due to his urinary symptoms. His PVR is 88 mL.   His major complaint today is urinary frequency.   He has had these symptoms for several months.  He denies any dysuria, hematuria or suprapubic pain.  He currently taking tamsulosin and finasteride, he did not pick up the Cialis prescription.  His insurance would not cover the medication. He also denies any recent fevers, chills, nausea or vomiting.  He does not have a family history of PCa.      IPSS      02/20/15 0900 03/23/15 0900     International Prostate Symptom Score   How often have you had the sensation of not emptying your bladder? Less than half the time About half the time    How often have you had to urinate less than every two hours? About half the time Almost always    How often have you found you stopped and started again several times when you urinated? About half the time About half the time    How often have you found it difficult to postpone urination? More than half the time Almost always    How often have you had a weak urinary stream? More than half the time Almost always    How often have you had to strain to start urination?  More than half the time About half the time    How many times did you typically get up at night to urinate? 2 Times 2 Times    Total IPSS Score 22 26    Quality of Life due to urinary symptoms   If you were to spend the rest of your life with your urinary condition just the way it is now how would you feel about that? Mostly Disatisfied Mostly Disatisfied       Score:  1-7 Mild 8-19 Moderate 20-35 Severe  Patient is still exhibiting some cognitive issues during our appointment today.  He did attempt to pick up the Cialis prescription, but he found that cost prohibitive. He stated he contacted our office regarding this issue.   He would like to have an appointment with Dr. Erlene Quan to discuss a possible bladder outlet procedure for his urinary symptoms.   PMH: Past Medical History  Diagnosis Date  . Heart disease   . Hematuria   . Diabetes mellitus ([place])   . HTN (hypertension)   . Hyperlipidemia   . Frequency   . BPH (benign prostatic hyperplasia)   . Microscopic hematuria   . Ejaculatory disorder   . Nocturia   . Gross hematuria   . Incomplete bladder emptying   . Erectile dysfunction  Surgical History: Past Surgical History  Procedure Laterality Date  . Rotator cuff repair Bilateral   . Cardiac stents      Home Medications:    Medication List       This list is accurate as of: 03/23/15  9:12 AM.  Always use your most recent med list.               amLODipine 5 MG tablet  Commonly known as:  NORVASC  TK 1 T PO ONCE A DAY     aspirin EC 81 MG tablet  Take by mouth.     atorvastatin 20 MG tablet  Commonly known as:  LIPITOR  Take by mouth.     clopidogrel 75 MG tablet  Commonly known as:  PLAVIX  Take by mouth.     finasteride 5 MG tablet  Commonly known as:  PROSCAR  Take 1 tablet (5 mg total) by mouth daily.     IRON PO  Take by mouth daily.     LEVEMIR FLEXTOUCH 100 UNIT/ML Pen  Generic drug:  Insulin Detemir  INJECT 35 UNITS QAM AND 15  UNITS QPM     levocetirizine 5 MG tablet  Commonly known as:  XYZAL  Take by mouth. Reported on 02/20/2015     lisinopril 10 MG tablet  Commonly known as:  PRINIVIL,ZESTRIL  Take by mouth. Reported on 02/20/2015     metFORMIN 1000 MG tablet  Commonly known as:  GLUCOPHAGE  Take by mouth.     metoprolol succinate 50 MG 24 hr tablet  Commonly known as:  TOPROL-XL  Take by mouth.     NOVOLOG FLEXPEN 100 UNIT/ML FlexPen  Generic drug:  insulin aspart  INJ 10 UNITS [place] TID     pantoprazole 40 MG tablet  Commonly known as:  PROTONIX     tadalafil 5 MG tablet  Commonly known as:  CIALIS  Take 1 tablet (5 mg total) by mouth daily as needed for erectile dysfunction.     tamsulosin 0.4 MG Caps capsule  Commonly known as:  FLOMAX  Take by mouth.     vitamin B-1 250 MG tablet  Take by mouth. Reported on 03/23/2015        Allergies:  Allergies  Allergen Reactions  . B Complex Formula 1     Family History: Family History  Problem Relation Age of Onset  . Ovarian cancer Sister   . Bladder Cancer Neg Hx   . Prostate cancer Neg Hx   . Kidney disease Brother     born one kidney    Social History:  reports that he has been smoking.  He does not have any smokeless tobacco history on file. He reports that he drinks alcohol. He reports that he does not use illicit drugs.  ROS: UROLOGY Frequent Urination?: Yes Hard to postpone urination?: Yes Burning/pain with urination?: No Get up at night to urinate?: Yes Leakage of urine?: Yes Urine stream starts and stops?: Yes Trouble starting stream?: Yes Do you have to strain to urinate?: No Blood in urine?: No Urinary tract infection?: No Sexually transmitted disease?: No Injury to kidneys or bladder?: No Painful intercourse?: No Weak stream?: Yes Erection problems?: No Penile pain?: No  Gastrointestinal Nausea?: No Vomiting?: No Indigestion/heartburn?: No Diarrhea?: No Constipation?: No  Constitutional Fever: No Night  sweats?: No Weight loss?: No Fatigue?: No  Skin Skin rash/lesions?: No Itching?: No  Eyes Blurred vision?: No Double vision?: No  Ears/Nose/Throat Sore throat?: No Sinus  problems?: No  Hematologic/Lymphatic Swollen glands?: No Easy bruising?: No  Cardiovascular Leg swelling?: No Chest pain?: No  Respiratory Cough?: No Shortness of breath?: No  Endocrine Excessive thirst?: No  Musculoskeletal Back pain?: No Joint pain?: No  Neurological Headaches?: No Dizziness?: No  Psychologic Depression?: Yes Anxiety?: No  Physical Exam: BP 170/82 mmHg  Pulse 61  Ht 5\' 8"  (1.727 m)  Wt 179 lb 6.4 oz (81.375 kg)  BMI 27.28 kg/m2  Constitutional: Well nourished. Alert and oriented, No acute distress. HEENT: [place] AT, moist mucus membranes. Trachea midline, no masses. Cardiovascular: No clubbing, cyanosis, or edema. Respiratory: Normal respiratory effort, no increased work of breathing. Skin: No rashes, bruises or suspicious lesions. Lymph: No cervical or inguinal adenopathy. Neurologic: Grossly intact, no focal deficits, moving all 4 extremities. Psychiatric: Normal mood and affect.  Laboratory Data:  Lab Results  Component Value Date   CREATININE 0.84 06/20/2011    PSA history:  2.6 ng/mL on 10/27/2012             0.7 ng/mL on 07/27/2013             0.6 ng/mL on 01/26/2014   Pertinent Imaging:                 Results for orders placed or performed in visit on 03/23/15  BLADDER SCAN AMB NON-IMAGING  Result Value Ref Range   Scan Result 88      Assessment & Plan:    1. BPH (benign prostatic hyperplasia) with LUTS:   Patient's IPSS score is 26/4.  His PVR 88 mL.   Patient is very irritated with his symptoms of urinary frequency.    I explained to him that he was on maximum medical therapy with the tamsulosin and the finasteride.  He found the Cialis 5 mg daily cost prohibitive.  He would like an appointment with Dr. Erlene Quan to discuss a possible bladder  outlet procedure.  - BLADDER SCAN AMB NON-IMAGING  2. Cognitive change:   Patient demonstrated some memory issues during our appointment today.  He has a new PCP, Juluis Rainier, FNP,  at [facility] clinic and we will send this note to her so she may be able to evaluate it further.  His notes were sent to her office on 03/06/2015.  Return for Patient would like an appointment with Dr. Erlene Quan to discuss HoLEP.  Zara Council, [facility] [address], [place], [place] [address] [phone]

## 2015-03-26 DIAGNOSIS — M7542 Impingement syndrome of left shoulder: Secondary | ICD-10-CM | POA: Insufficient documentation

## 2015-03-27 ENCOUNTER — Other Ambulatory Visit: Payer: Self-pay | Admitting: Unknown Physician Specialty

## 2015-03-27 DIAGNOSIS — M7542 Impingement syndrome of left shoulder: Secondary | ICD-10-CM

## 2015-04-06 ENCOUNTER — Ambulatory Visit
Admission: RE | Admit: 2015-04-06 | Discharge: 2015-04-06 | Disposition: A | Discharge status: Home / Self Care | Payer: Medicare Other | Source: Ambulatory Visit | Attending: Unknown Physician Specialty | Admitting: Unknown Physician Specialty

## 2015-04-06 DIAGNOSIS — M67814 Other specified disorders of tendon, left shoulder: Secondary | ICD-10-CM | POA: Insufficient documentation

## 2015-04-06 DIAGNOSIS — M25512 Pain in left shoulder: Secondary | ICD-10-CM | POA: Diagnosis present

## 2015-04-06 DIAGNOSIS — M7542 Impingement syndrome of left shoulder: Secondary | ICD-10-CM | POA: Diagnosis present

## 2015-04-06 DIAGNOSIS — M75112 Incomplete rotator cuff tear or rupture of left shoulder, not specified as traumatic: Secondary | ICD-10-CM | POA: Insufficient documentation

## 2015-04-10 DIAGNOSIS — M720 Palmar fascial fibromatosis [Dupuytren]: Secondary | ICD-10-CM | POA: Insufficient documentation

## 2015-05-02 ENCOUNTER — Ambulatory Visit (INDEPENDENT_AMBULATORY_CARE_PROVIDER_SITE_OTHER): Payer: Medicare Other | Admitting: Urology

## 2015-05-02 ENCOUNTER — Encounter: Payer: Self-pay | Admitting: Urology

## 2015-05-02 ENCOUNTER — Ambulatory Visit: Payer: Medicare Other | Admitting: Urology

## 2015-05-02 VITALS — BP 180/109 | HR 88 | Ht 68.0 in | Wt 180.0 lb

## 2015-05-02 DIAGNOSIS — N138 Other obstructive and reflux uropathy: Secondary | ICD-10-CM

## 2015-05-02 DIAGNOSIS — K219 Gastro-esophageal reflux disease without esophagitis: Secondary | ICD-10-CM | POA: Insufficient documentation

## 2015-05-02 DIAGNOSIS — D509 Iron deficiency anemia, unspecified: Secondary | ICD-10-CM | POA: Insufficient documentation

## 2015-05-02 DIAGNOSIS — N401 Enlarged prostate with lower urinary tract symptoms: Secondary | ICD-10-CM

## 2015-05-02 DIAGNOSIS — N3281 Overactive bladder: Secondary | ICD-10-CM

## 2015-05-02 DIAGNOSIS — E785 Hyperlipidemia, unspecified: Secondary | ICD-10-CM | POA: Insufficient documentation

## 2015-05-02 DIAGNOSIS — I10 Essential (primary) hypertension: Secondary | ICD-10-CM | POA: Diagnosis not present

## 2015-05-02 DIAGNOSIS — I2510 Atherosclerotic heart disease of native coronary artery without angina pectoris: Secondary | ICD-10-CM | POA: Insufficient documentation

## 2015-05-02 DIAGNOSIS — E119 Type 2 diabetes mellitus without complications: Secondary | ICD-10-CM | POA: Insufficient documentation

## 2015-05-02 DIAGNOSIS — E1165 Type 2 diabetes mellitus with hyperglycemia: Secondary | ICD-10-CM | POA: Insufficient documentation

## 2015-05-02 DIAGNOSIS — N400 Enlarged prostate without lower urinary tract symptoms: Secondary | ICD-10-CM | POA: Insufficient documentation

## 2015-05-02 DIAGNOSIS — M545 Low back pain, unspecified: Secondary | ICD-10-CM | POA: Insufficient documentation

## 2015-05-02 DIAGNOSIS — G8929 Other chronic pain: Secondary | ICD-10-CM | POA: Insufficient documentation

## 2015-05-02 NOTE — Progress Notes (Signed)
05/02/2015 1:43 PM   [place] Nov 25, 1935 [id number]  Referring provider: Sallee Lange, NP [place], [place] [address]  Chief Complaint  Patient presents with  . Benign Prostatic Hypertrophy    discuss HOLEP    HPI: 80 year old male with refractory BPH with lots who presents today to reassess his symptoms.  He has been on finasteride and Flomax for quite some time.  He was also prescribed Cialis daily but never able to fill this medication due to cost.  He's never been on a deck ownership medications. He is a diabetic.   He continues to have quite severe mixed urinary symptoms including obstructive and irritative voiding symptoms. This includes slow stream, incomplete bladder emptying, urinary urgency, frequency, and nocturia 2-3.  No gross hematuria or UTIs.    PVR last visit 88 mL.  TRUS volume 45 cc on 05/2014.  Cystoscopy  Showed obstructing appearing prostate with trilobar coaptation and a median lobe on retroflexion.  Most recent PSA 0.6 ng/dL on 01/26/2014.  Most recent IPSS, 26, bother 6  PMH: Past Medical History  Diagnosis Date  . Heart disease   . Hematuria   . Diabetes mellitus ([place])   . HTN (hypertension)   . Hyperlipidemia   . Frequency   . BPH (benign prostatic hyperplasia)   . Microscopic hematuria   . Ejaculatory disorder   . Nocturia   . Gross hematuria   . Incomplete bladder emptying   . Erectile dysfunction   . Rotator cuff tear left    Surgical History: Past Surgical History  Procedure Laterality Date  . Rotator cuff repair Bilateral   . Cardiac stents      Home Medications:    Medication List       This list is accurate as of: 05/02/15  1:43 PM.  Always use your most recent med list.               amLODipine 5 MG tablet  Commonly known as:  NORVASC  TK 1 T PO ONCE A DAY     aspirin EC 81 MG tablet  Take by mouth.     clopidogrel 75 MG tablet    Commonly known as:  PLAVIX  Take by mouth.     finasteride 5 MG tablet  Commonly known as:  PROSCAR  Take 1 tablet (5 mg total) by mouth daily.     LEVEMIR FLEXTOUCH 100 UNIT/ML Pen  Generic drug:  Insulin Detemir  INJECT 35 UNITS QAM AND 15 UNITS QPM     metFORMIN 1000 MG tablet  Commonly known as:  GLUCOPHAGE  Take by mouth.     metoprolol succinate 50 MG 24 hr tablet  Commonly known as:  TOPROL-XL  Take by mouth.     NOVOLOG FLEXPEN 100 UNIT/ML FlexPen  Generic drug:  insulin aspart  INJ 10 UNITS [place] TID     pantoprazole 40 MG tablet  Commonly known as:  PROTONIX     rosuvastatin 5 MG tablet  Commonly known as:  CRESTOR     tamsulosin 0.4 MG Caps capsule  Commonly known as:  FLOMAX  Take by mouth.        Allergies:  Allergies  Allergen Reactions  . B Complex Formula 1     Family History: Family History  Problem Relation Age of Onset  . Ovarian cancer Sister   . Bladder Cancer Neg Hx   . Prostate cancer Neg Hx   .  Kidney disease Brother     born one kidney    Social History:  reports that he has been smoking.  He does not have any smokeless tobacco history on file. He reports that he drinks alcohol. He reports that he does not use illicit drugs.  ROS: UROLOGY Frequent Urination?: Yes Hard to postpone urination?: Yes Burning/pain with urination?: No Get up at night to urinate?: Yes Leakage of urine?: No Urine stream starts and stops?: No Trouble starting stream?: Yes Do you have to strain to urinate?: No Blood in urine?: No Urinary tract infection?: No Sexually transmitted disease?: No Injury to kidneys or bladder?: No Painful intercourse?: No Weak stream?: Yes Erection problems?: No Penile pain?: No  Gastrointestinal Nausea?: No Vomiting?: No Indigestion/heartburn?: No Diarrhea?: No Constipation?: No  Constitutional Fever: No Night sweats?: No Weight loss?: No Fatigue?: No  Skin Skin rash/lesions?: No Itching?:  No  Eyes Blurred vision?: No Double vision?: No  Ears/Nose/Throat Sore throat?: No Sinus problems?: No  Hematologic/Lymphatic Swollen glands?: No Easy bruising?: No  Cardiovascular Leg swelling?: No Chest pain?: No  Respiratory Cough?: No Shortness of breath?: No  Endocrine Excessive thirst?: No  Musculoskeletal Back pain?: No Joint pain?: No  Neurological Headaches?: No Dizziness?: No  Psychologic Depression?: No Anxiety?: No  Physical Exam: BP 180/109 mmHg  Pulse 88  Ht 5\' 8"  (1.727 m)  Wt 180 lb (81.647 kg)  BMI 27.38 kg/m2  Constitutional:  Alert and oriented, No acute distress. HEENT: [place] AT, moist mucus membranes.  Trachea midline, no masses. Cardiovascular: No clubbing, cyanosis, or edema. Respiratory: Normal respiratory effort, no increased work of breathing. GI: Abdomen is soft, nontender, nondistended, no abdominal masses Skin: No rashes, bruises or suspicious lesions. Neurologic: Grossly intact, no focal deficits, moving all 4 extremities. Psychiatric: Normal mood and affect.  Laboratory Data: Lab Results  Component Value Date   WBC 5.7 06/20/2011   HGB 13.5 06/20/2011   HCT 38.6* 06/20/2011   MCV 96 06/20/2011   PLT 192 06/20/2011    Lab Results  Component Value Date   CREATININE 0.84 06/20/2011    Assessment & Plan:  80 year old male with refractory obstructive and irritative voiding symptoms despite maximal medical therapy with finasteride and Flomax. We discussed various options today including proceeding to the operating room for an outlet procedure versus addition of anticholinergic/beta 3 medication for overactive symptoms versus  further workup with urodynamics. He does have an obstructing appearing the lobe on cystoscopy but overall prostate size is not exceptionally large. As such, uanble to guarantee that his irritative voiding symptoms will improve with reduction of his outlet.  It may be very beneficial to obtain urodynamics  to assess the degree of outlet obstruction and overactivity for further planning. Discuss UDS at length today with the patient. He is agreeable with this and would like to proceed. He will follow-up in my office once UDS is complete.  1. BPH with obstruction/lower urinary tract symptoms As above   2. OAB (overactive bladder) As above - Ambulatory referral to Urology  3. Essential hypertension Patient fairly hypertensive today in the office but otherwise asymptomatic. I have asked him to follow up with his PCP for this.     Return for f/u Dr. Erlene Quan after UDS.  Hollice Espy, MD  [facility] [address] [place], [place] [address] [phone]

## 2015-05-23 ENCOUNTER — Other Ambulatory Visit: Payer: Self-pay | Admitting: Urology

## 2015-06-07 ENCOUNTER — Encounter: Payer: Self-pay | Admitting: Urology

## 2015-06-07 ENCOUNTER — Ambulatory Visit (INDEPENDENT_AMBULATORY_CARE_PROVIDER_SITE_OTHER): Payer: Medicare Other | Admitting: Urology

## 2015-06-07 ENCOUNTER — Telehealth: Payer: Self-pay | Admitting: Radiology

## 2015-06-07 VITALS — BP 166/73 | HR 60 | Ht 68.0 in | Wt 180.0 lb

## 2015-06-07 DIAGNOSIS — N401 Enlarged prostate with lower urinary tract symptoms: Secondary | ICD-10-CM | POA: Diagnosis not present

## 2015-06-07 DIAGNOSIS — N3281 Overactive bladder: Secondary | ICD-10-CM | POA: Diagnosis not present

## 2015-06-07 DIAGNOSIS — N4340 Spermatocele of epididymis, unspecified: Secondary | ICD-10-CM

## 2015-06-07 DIAGNOSIS — N138 Other obstructive and reflux uropathy: Secondary | ICD-10-CM

## 2015-06-07 LAB — MICROSCOPIC EXAMINATION: Bacteria, UA: NONE SEEN

## 2015-06-07 LAB — URINALYSIS, COMPLETE
Bilirubin, UA: NEGATIVE
Glucose, UA: NEGATIVE
Ketones, UA: NEGATIVE
Leukocytes, UA: NEGATIVE
NITRITE UA: NEGATIVE
PH UA: 5.5 (ref 5.0–7.5)
Protein, UA: NEGATIVE
RBC, UA: NEGATIVE
Specific Gravity, UA: 1.01 (ref 1.005–1.030)
Urobilinogen, Ur: 0.2 mg/dL (ref 0.2–1.0)

## 2015-06-07 NOTE — Telephone Encounter (Signed)
Notified pt's wife of appt made with Dr Ubaldo Glassing at [facility] in [place] on 06/14/15 @4 :00. Advised pt that Dr Bethanne Ginger office requests last EKG & office notes from his previous cardiologist & he will need to go to that office to sign a medical records release. Arbie Cookey voices understanding.

## 2015-06-07 NOTE — Patient Instructions (Signed)
Transurethral Resection of the Prostate Transurethral resection of the prostate (TURP) is the removal of part of your prostate to treat noncancerous (benign) prostatic hyperplasia (BPH). BPH typically occurs in men older than 40 years. It is the abnormal growth of cells in your prostate. Specifically, it is an abnormal increase in the number of cells that make up your prostate tissue. This causes an increase in the size of your prostate. Often, in the case of BPH, the prostate becomes so large that it compresses the tube that drains urine out of your body from your bladder (urethra). Eventually, this compression can obstruct the flow of urine from your bladder. This obstruction can cause recurrent bladder infection and difficulties with bladder control and bladder emptying. The goal of TURP is to remove enough prostate tissue to allow for an unobstructed flow of urine, which often resolves the associated conditions. LET YOUR CAREGIVER KNOW ABOUT:  Any allergies you have.  Any medicines you are taking, including herbs, eye drops, over-the-counter medicines, and creams.  Any problems you have had with the use of anesthetics.  Any blood disorders you have, including bleeding problems and clotting problems.  Previous surgeries you have had.  Any prostate infections you have had. RISKS AND COMPLICATIONS Generally, TURP is a safe procedure. However, as with any surgical procedure, complications can occur. Possible complications associated with TURP include:  Difficulty getting an erection.  Scarring, which may cause problems with the flow of your urine.  Injury to your urethra.  Incontinence from injury to the muscle that surrounds your prostate, which controls urine flow.  Infection.  Bleeding.  Injury to your bladder (rare). BEFORE THE PROCEDURE  Your caregiver will tell you when you need to stop eating and drinking. If you take any medicines, your caregiver will tell you which ones you  may keep taking and which ones you will have to stop taking and when.  Just before the procedure you will also receive medicine to make you fall asleep (general anesthetic). This will be given through a tube that is inserted into one of your veins (intravenous [IV] tube). PROCEDURE Your surgeon inserts an instrument that is similar to a telescope with an electric cutting edge (resectoscope) through your urethra to the area of the prostate gland. The cutting edge is used to remove enlarged pieces of your prostate, one piece at a time. At the end of your procedure, a flexible tube (catheter) will be inserted into your urethra to drain your bladder. Special plastic bags filled with solution will be connected to the end of the catheter. The solution will be used to irrigate blood from your bladder while you heal.  AFTER THE PROCEDURE You will be taken to the recovery area. Once you are awake, stable, and taking fluids well, you will be taken to your hospital room. Typically, you will stay in the hospital 1-2 days after this procedure. The catheter usually is removed before discharge from the hospital.   This information is not intended to replace advice given to you by your health care provider. Make sure you discuss any questions you have with your health care provider.   Document Released: 02/03/2005 Document Revised: 02/24/2014 Document Reviewed: 07/07/2011 Elsevier Interactive Patient Education [employer].

## 2015-06-07 NOTE — Progress Notes (Signed)
10:26 AM  06/07/2015   Gregory Tran 1935-04-08 [id number]  Referring provider: Sallee Lange, NP [place], [place] [address]   HPI: 80 year old male with refractory BPH with LUTS  who presents today for f/u UDS results.    He has been on finasteride and Flomax for quite some time.  He was also prescribed Cialis daily but never able to fill this medication due to cost.  He's never been on OAB medications. He is a diabetic.   He continues to have quite severe mixed urinary symptoms including obstructive and irritative voiding symptoms. This includes slow stream, incomplete bladder emptying, urinary urgency, frequency, and nocturia 2-3.  No gross hematuria or UTIs.    PVR last visit 88 mL.  TRUS volume 45 cc on 05/2014.  Cystoscopy showed obstructing appearing prostate with trilobar coaptation and a median lobe on retroflexion.  Most recent PSA 0.6 ng/dL on 01/26/2014.  Most recent IPSS, 26, bother 6  Urodynamics performed on 05/23/2015 at [facility] urology are consistent with bladder outlet obstruction. His first sensation was 120 mL's, normal desire to void at 252, strong desire at 397 mL's. He did have some unstable bladder contractions but no leakage. He was able to inhibit these unstable contractions. Pressure flow study indicate a voluntary contraction with a max flow rate of 5 and most perspective, detrusor pressure at max flow was 55 cm of water at which time he voided 127 cc with a postvoid residual of 185 cc. Calculated bladder outlet obstructive index was 44.8, within the obstructed range. EMG was normal. On fluoroscopy, there is evidence of elevation of the bladder base without reflux.  PMH: Past Medical History  Diagnosis Date  . Heart disease   . Hematuria   . Diabetes mellitus ([place])   . HTN (hypertension)   . Hyperlipidemia   . Frequency   . BPH (benign prostatic hyperplasia)   . Microscopic  hematuria   . Ejaculatory disorder   . Nocturia   . Gross hematuria   . Incomplete bladder emptying   . Erectile dysfunction   . Rotator cuff tear left    Surgical History: Past Surgical History  Procedure Laterality Date  . Rotator cuff repair Bilateral   . Cardiac stents      Home Medications:    Medication List       This list is accurate as of: 06/07/15 10:26 AM.  Always use your most recent med list.               amLODipine 5 MG tablet  Commonly known as:  NORVASC  TK 1 T PO ONCE A DAY     aspirin EC 81 MG tablet  Take by mouth.     clopidogrel 75 MG tablet  Commonly known as:  PLAVIX  Take by mouth.     finasteride 5 MG tablet  Commonly known as:  PROSCAR  Take 1 tablet (5 mg total) by mouth daily.     LEVEMIR FLEXTOUCH 100 UNIT/ML Pen  Generic drug:  Insulin Detemir  INJECT 35 UNITS QAM AND 15 UNITS QPM     lisinopril 10 MG tablet  Commonly known as:  PRINIVIL,ZESTRIL  TK 1 T PO QD     metFORMIN 1000 MG tablet  Commonly known as:  GLUCOPHAGE  Take by mouth.     metoprolol succinate 50 MG 24 hr tablet  Commonly known as:  TOPROL-XL  Take by mouth.  NOVOLOG FLEXPEN 100 UNIT/ML FlexPen  Generic drug:  insulin aspart  INJ 10 UNITS [place] TID     pantoprazole 40 MG tablet  Commonly known as:  PROTONIX     rosuvastatin 5 MG tablet  Commonly known as:  CRESTOR     tamsulosin 0.4 MG Caps capsule  Commonly known as:  FLOMAX  Take by mouth.        Allergies:  Allergies  Allergen Reactions  . B Complex Formula 1     Family History: Family History  Problem Relation Age of Onset  . Ovarian cancer Sister   . Bladder Cancer Neg Hx   . Prostate cancer Neg Hx   . Kidney disease Brother     born one kidney    Social History:  reports that he has been smoking.  He does not have any smokeless tobacco history on file. He reports that he drinks alcohol. He reports that he does not use illicit drugs.  ROS: UROLOGY Frequent Urination?:  Yes Hard to postpone urination?: Yes Burning/pain with urination?: No Get up at night to urinate?: Yes Leakage of urine?: Yes Urine stream starts and stops?: Yes Trouble starting stream?: Yes Do you have to strain to urinate?: No Blood in urine?: No Urinary tract infection?: No Sexually transmitted disease?: No Injury to kidneys or bladder?: No Painful intercourse?: No Weak stream?: Yes Erection problems?: No Penile pain?: No  Gastrointestinal Nausea?: No Vomiting?: No Indigestion/heartburn?: No Diarrhea?: No Constipation?: No  Constitutional Fever: No Night sweats?: No Weight loss?: No Fatigue?: No  Skin Skin rash/lesions?: No Itching?: No  Eyes Blurred vision?: No Double vision?: No  Ears/Nose/Throat Sore throat?: No Sinus problems?: No  Hematologic/Lymphatic Swollen glands?: No Easy bruising?: Yes  Cardiovascular Leg swelling?: No Chest pain?: No  Respiratory Cough?: No Shortness of breath?: No  Endocrine Excessive thirst?: No  Musculoskeletal Back pain?: No Joint pain?: No  Neurological Headaches?: No Dizziness?: Yes  Psychologic Depression?: No Anxiety?: No  Physical Exam: BP 166/73 mmHg  Pulse 60  Ht 5\' 8"  (1.727 m)  Wt 180 lb (81.647 kg)  BMI 27.38 kg/m2  Constitutional:  Alert and oriented, No acute distress. HEENT: [place] AT, moist mucus membranes.  Trachea midline, no masses. Cardiovascular: No clubbing, cyanosis, or edema. RRR. Respiratory: Normal respiratory effort, no increased work of breathing.  CTAB. GI: Abdomen is soft, nontender, nondistended, no abdominal masses GU: Bilateral descended testicles. Left spermatocele noted, approximately 3 cm. Skin: No rashes, bruises or suspicious lesions. Neurologic: Grossly intact, no focal deficits, moving all 4 extremities. Psychiatric: Normal mood and affect.  Laboratory Data: Lab Results  Component Value Date   WBC 5.7 06/20/2011   HGB 13.5 06/20/2011   HCT 38.6* 06/20/2011    MCV 96 06/20/2011   PLT 192 06/20/2011    Lab Results  Component Value Date   CREATININE 0.84 06/20/2011    Assessment & Plan:  80 year old male with refractory obstructive and irritative voiding symptoms despite maximal medical therapy with finasteride and Flomax. UDS confirms bladder outlet obstruction with some mild bladder instability.  Lengthy discussion today about his various options. I do feel that he would benefit from an outlet procedure at this point. Again, unable to guarantee that this irritative voiding symptoms will improve or resolve completely.    Risks and benefits of TURP were reviewed today in detail. The preoperative, intraoperative, postoperative course were discussed. Risk of bleeding, infection, damage to surrounding structures, retrograde ejaculation, stress urinary incontinence were discussed in detail.  All  this questions were answered. He would like to proceed with TURP.  1. BPH with obstruction/lower urinary tract symptoms As above   Will need cardiac clearance from to stop plavix/ ASA (history of cardiac stents).  If unable to come off ASA, may consider HoLAP to reduce risk of bleeding.    2. OAB (overactive bladder)  As above  3. Left spermatocele Asymptomatic.  No intervention recommended.  Schedule above procedure   Hollice Espy, MD  [facility] [address], [place], [place] [address] [phone]  I spent 30 min with this patient of which greater than 50% was spent in counseling and coordination of care with the patient.

## 2015-06-08 NOTE — Telephone Encounter (Signed)
Notified pt of surgery scheduled 07/09/15, pre-admit testing appt on 5/8 @11 :15 and to call Friday prior to surgery for arrival time to SDS. Pt will be notified when to stop ASA 81mg  & Plavix after his appt with Dr Ubaldo Glassing. Pt & wife voice understanding.

## 2015-06-10 LAB — CULTURE, URINE COMPREHENSIVE

## 2015-06-14 DIAGNOSIS — I10 Essential (primary) hypertension: Secondary | ICD-10-CM | POA: Insufficient documentation

## 2015-06-19 ENCOUNTER — Other Ambulatory Visit: Payer: Self-pay | Admitting: Urology

## 2015-06-20 ENCOUNTER — Other Ambulatory Visit: Payer: Self-pay

## 2015-06-20 DIAGNOSIS — N400 Enlarged prostate without lower urinary tract symptoms: Secondary | ICD-10-CM

## 2015-06-20 MED ORDER — FINASTERIDE 5 MG PO TABS: 5.0000 mg | ORAL_TABLET | Freq: Every day | ORAL | Status: DC

## 2015-06-25 ENCOUNTER — Encounter
Admission: RE | Admit: 2015-06-25 | Discharge: 2015-06-25 | Disposition: A | Discharge status: Home / Self Care | Payer: Medicare Other | Source: Ambulatory Visit | Attending: Urology | Admitting: Urology

## 2015-06-25 ENCOUNTER — Telehealth: Payer: Self-pay | Admitting: Radiology

## 2015-06-25 DIAGNOSIS — Z01812 Encounter for preprocedural laboratory examination: Secondary | ICD-10-CM | POA: Diagnosis present

## 2015-06-25 DIAGNOSIS — N400 Enlarged prostate without lower urinary tract symptoms: Secondary | ICD-10-CM | POA: Diagnosis not present

## 2015-06-25 HISTORY — DX: Chronic kidney disease, unspecified: N18.9

## 2015-06-25 HISTORY — DX: Unspecified osteoarthritis, unspecified site: M19.90

## 2015-06-25 HISTORY — DX: Bitten or stung by nonvenomous insect and other nonvenomous arthropods, initial encounter: W57.XXXA

## 2015-06-25 HISTORY — DX: Malignant (primary) neoplasm, unspecified: C80.1

## 2015-06-25 HISTORY — DX: Polyneuropathy, unspecified: G62.9

## 2015-06-25 HISTORY — DX: Atherosclerotic heart disease of native coronary artery without angina pectoris: I25.10

## 2015-06-25 HISTORY — DX: Gastro-esophageal reflux disease without esophagitis: K21.9

## 2015-06-25 LAB — DIFFERENTIAL
BASOS ABS: 0 10*3/uL (ref 0–0.1)
BASOS PCT: 1 %
Eosinophils Absolute: 0.3 10*3/uL (ref 0–0.7)
Eosinophils Relative: 6 %
Lymphocytes Relative: 34 %
Lymphs Abs: 1.8 10*3/uL (ref 1.0–3.6)
MONOS PCT: 9 %
Monocytes Absolute: 0.5 10*3/uL (ref 0.2–1.0)
NEUTROS ABS: 2.7 10*3/uL (ref 1.4–6.5)
Neutrophils Relative %: 50 %

## 2015-06-25 LAB — CBC
HCT: 39 % — ABNORMAL LOW (ref 40.0–52.0)
Hemoglobin: 13.4 g/dL (ref 13.0–18.0)
MCH: 32.4 pg (ref 26.0–34.0)
MCHC: 34.3 g/dL (ref 32.0–36.0)
MCV: 94.5 fL (ref 80.0–100.0)
Platelets: 211 10*3/uL (ref 150–440)
RBC: 4.13 MIL/uL — ABNORMAL LOW (ref 4.40–5.90)
RDW: 13.5 % (ref 11.5–14.5)
WBC: 5.4 10*3/uL (ref 3.8–10.6)

## 2015-06-25 LAB — BASIC METABOLIC PANEL
ANION GAP: 9 (ref 5–15)
BUN: 19 mg/dL (ref 6–20)
CO2: 26 mmol/L (ref 22–32)
Calcium: 9.8 mg/dL (ref 8.9–10.3)
Chloride: 103 mmol/L (ref 101–111)
Creatinine, Ser: 0.91 mg/dL (ref 0.61–1.24)
GFR calc Af Amer: >60 mL/min (ref >60)
GLUCOSE: 159 mg/dL — AB (ref 65–99)
POTASSIUM: 4.3 mmol/L (ref 3.5–5.1)
SODIUM: 138 mmol/L (ref 135–145)

## 2015-06-25 NOTE — Telephone Encounter (Signed)
Per Baker Janus in [place], pt has 2 tick bites - one at his groin & another on his chest.  The one on his chest is red & itches. He has no other complaints regarding the tick bites. Also, pt has "crusty patches" on his arms that are white in the center surrounded by redness.  Are these a concern regarding his upcoming surgery 07/09/15 for TURP?  Pt is pending cardiac clearance after a stress test scheduled 06/28/15. Please advise.

## 2015-06-25 NOTE — Pre-Procedure Instructions (Signed)
Patient called at home and instructed to notify PCP regarding tick bites and areas on right arm per Dr. Erlene Quan orders.

## 2015-06-25 NOTE — Pre-Procedure Instructions (Signed)
Amy @ Dr. Erlene Quan office notified of recent tick bites

## 2015-06-25 NOTE — Pre-Procedure Instructions (Signed)
Several  crusty and reddened areas noted on right arm, instructed patient to follow up with PCP.

## 2015-06-25 NOTE — Patient Instructions (Signed)
  Your procedure is scheduled on: Jul 09, 2015 (Monday) Report to Day Surgery.[facility]) Second Floor To find out your arrival time please call [phone] between 1PM - 3PM on Jul 06, 2015 (Friday).  Remember: Instructions that are not followed completely may result in serious medical risk, up to and including death, or upon the discretion of your surgeon and anesthesiologist your surgery may need to be rescheduled.    __x__ 1. Do not eat food or drink liquids after midnight. No gum chewing or hard candies.     __x__ 2. No Alcohol for 24 hours before or after surgery.   ____ 3. Bring all medications with you on the day of surgery if instructed.    __x__ 4. Notify your doctor if there is any change in your medical condition     (cold, fever, infections).     Do not wear jewelry, make-up, hairpins, clips or nail polish.  Do not wear lotions, powders, or perfumes. You may wear deodorant.  Do not shave 48 hours prior to surgery. Men may shave face and neck.  Do not bring valuables to the hospital.    [facility] is not responsible for any belongings or valuables.               Contacts, dentures or bridgework may not be worn into surgery.  Leave your suitcase in the car. After surgery it may be brought to your room.  For patients admitted to the hospital, discharge time is determined by your                treatment team.   Patients discharged the day of surgery will not be allowed to drive home.   Please read over the following fact sheets that you were given:   Surgical Site Infection Prevention   __x__ Take these medicines the morning of surgery with A SIP OF WATER:    1. Amlodipine  2. lisinopril  3. Metoprolol  4.Pantoprazole (Pantoprazole at bedtime on Sunday night--May 21)  5.  6.  ____ Fleet Enema (as directed)   ____ Use CHG Soap as directed  ____ Use inhalers on the day of surgery  __x__ Stop metformin 2 days prior to surgery (STOP METFORMIN ON MAY  20)    __x__ Take 1/2 of usual insulin dose the night before surgery and none on the morning of surgery. (NO INSULIN THE MORNING OF SURGERY)  __x__ Stop Coumadin/Plavix/aspirin on (DR FATH TO INFORM WHEN TO STOP ASPIRIN AND PLAVIX ON MAY 11)  __X__ Stop Anti-inflammatories on (NO NSAIDS) Tylenol ok to take for pain if needed   __x__ Stop supplements until after surgery.  (STOP VITAMIN B NOW)  ____ Bring C-Pap to the hospital.

## 2015-06-25 NOTE — Telephone Encounter (Signed)
He should see his PCP but no need to delay surgery.  Hollice Espy, MD

## 2015-07-02 NOTE — Pre-Procedure Instructions (Signed)
CLEARED LOW RISK BY DR Ubaldo Glassing 07/02/15

## 2015-07-02 NOTE — Pre-Procedure Instructions (Signed)
   Negative ETT with no arrhythmia or ischemia with normal LV  function.  Low risk study.   Result Narrative  CARDIOLOGY DEPARTMENT [facility] [address] [place], [place]  [id number] [phone]  Procedure: Exercise Myocardial Perfusion Imaging   ONE day procedure  Indication: Coronary artery disease of native artery of native heart with  stable angina pectoris (CMS-HCC) Plan: NM myocardial perfusion SPECT multiple (stress        and rest), ECG stress test only  Ordering Physician:   Dr. Bartholome Bill   Clinical History: 80 y.o. year old male Vitals: Height: 68 in  Weight: 181 lb Cardiac risk factors include:    Smoking, Hyperlipidemia, Diabetes, HTN and CAD    Procedure: The patient performed treadmill exercise using a Bruce protocol for 7:41  minutes. The exercise test was stopped due to fatigue.  Blood pressure  response was hypertensive.   Rest HR: 58bpm Rest BP: 140/9mmHg Max HR: 134bpm Max BP: 204/57mmHg Mets:     10.10 % MAX HR:   93%  Stress Test Administered by: Oswald Hillock, CMA  ECG Interpretation: Rest ECG:  normal sinus rhythm, none Stress ECG:  sinus tachycardia, nonspecific ST-T wave changes Recovery ECG:  normal sinus rhythm ECG Interpretation:  negative, nondiagnostic changes.   Administrations This Visit    technetium Tc16m sestamibi (CARDIOLITE) injection 0000000 millicurie    Admin Date Action Dose Route Administered By      [id number] Given 0000000 millicurie Intravenous Scott N Goard, CNMT            technetium Tc81m sestamibi (CARDIOLITE) injection Q000111Q millicurie    Admin Date Action Dose Route Administered By      [id number] Given [id number] millicurie Intravenous Kingsley Callander, CNMT              Gated post-stress perfusion imaging was performed 30 minutes after stress.  Rest images were performed 30 minutes after injection.  Gated LV Analysis:  TID:  0.9  LVEF=  54%  FINDINGS: Regional wall motion:  reveals normal myocardial thickening and wall  motion. The overall quality of the study is good.   Artifacts noted: no Left ventricular cavity: normal.  Perfusion Analysis:  SPECT images demonstrate homogeneous tracer  distribution throughout the myocardium.     Status Results Details   Encounter Summary  April

## 2015-07-03 NOTE — Telephone Encounter (Signed)
Advised pts wife, Arbie Cookey, pt needs to hold ASA 81mg  and Plavix beginning immediately. May resume both medications after surgery scheduled 07/09/15. Arbie Cookey voices understanding.

## 2015-07-08 MED ORDER — CEFAZOLIN SODIUM-DEXTROSE 2-4 GM/100ML-% IV SOLN
2.0000 g | Freq: Once | INTRAVENOUS | Status: AC
  Administered 2015-07-09: 2 g via INTRAVENOUS

## 2015-07-09 ENCOUNTER — Inpatient Hospital Stay
Admission: RE | Admit: 2015-07-09 | Discharge: 2015-07-10 | DRG: 714 | Disposition: A | Discharge status: Home / Self Care | Payer: Medicare Other | Source: Ambulatory Visit | Attending: Urology | Admitting: Urology

## 2015-07-09 ENCOUNTER — Encounter: Payer: Self-pay | Admitting: *Deleted

## 2015-07-09 ENCOUNTER — Inpatient Hospital Stay: Payer: Medicare Other | Admitting: Anesthesiology

## 2015-07-09 ENCOUNTER — Encounter
Admission: RE | Disposition: A | Discharge status: Home / Self Care | Payer: Self-pay | Source: Ambulatory Visit | Attending: Urology

## 2015-07-09 DIAGNOSIS — N401 Enlarged prostate with lower urinary tract symptoms: Secondary | ICD-10-CM | POA: Diagnosis present

## 2015-07-09 DIAGNOSIS — G8929 Other chronic pain: Secondary | ICD-10-CM | POA: Diagnosis present

## 2015-07-09 DIAGNOSIS — N138 Other obstructive and reflux uropathy: Secondary | ICD-10-CM | POA: Diagnosis present

## 2015-07-09 DIAGNOSIS — R338 Other retention of urine: Secondary | ICD-10-CM | POA: Diagnosis present

## 2015-07-09 DIAGNOSIS — N529 Male erectile dysfunction, unspecified: Secondary | ICD-10-CM | POA: Diagnosis present

## 2015-07-09 DIAGNOSIS — N3281 Overactive bladder: Secondary | ICD-10-CM | POA: Diagnosis present

## 2015-07-09 DIAGNOSIS — I2510 Atherosclerotic heart disease of native coronary artery without angina pectoris: Secondary | ICD-10-CM | POA: Diagnosis present

## 2015-07-09 DIAGNOSIS — E119 Type 2 diabetes mellitus without complications: Secondary | ICD-10-CM | POA: Diagnosis present

## 2015-07-09 DIAGNOSIS — N4341 Spermatocele of epididymis, single: Secondary | ICD-10-CM | POA: Diagnosis present

## 2015-07-09 DIAGNOSIS — E785 Hyperlipidemia, unspecified: Secondary | ICD-10-CM | POA: Diagnosis present

## 2015-07-09 DIAGNOSIS — Z841 Family history of disorders of kidney and ureter: Secondary | ICD-10-CM

## 2015-07-09 DIAGNOSIS — N320 Bladder-neck obstruction: Secondary | ICD-10-CM | POA: Diagnosis present

## 2015-07-09 DIAGNOSIS — Z955 Presence of coronary angioplasty implant and graft: Secondary | ICD-10-CM | POA: Diagnosis not present

## 2015-07-09 DIAGNOSIS — Z79899 Other long term (current) drug therapy: Secondary | ICD-10-CM | POA: Diagnosis not present

## 2015-07-09 DIAGNOSIS — M545 Low back pain: Secondary | ICD-10-CM | POA: Diagnosis present

## 2015-07-09 DIAGNOSIS — Z8041 Family history of malignant neoplasm of ovary: Secondary | ICD-10-CM

## 2015-07-09 DIAGNOSIS — N3289 Other specified disorders of bladder: Secondary | ICD-10-CM | POA: Diagnosis present

## 2015-07-09 HISTORY — PX: TRANSURETHRAL RESECTION OF PROSTATE: SHX73

## 2015-07-09 LAB — GLUCOSE, CAPILLARY
GLUCOSE-CAPILLARY: 299 mg/dL — AB (ref 65–99)
GLUCOSE-CAPILLARY: 268 mg/dL — AB (ref 65–99)
Glucose-Capillary: 135 mg/dL — ABNORMAL HIGH (ref 65–99)
Glucose-Capillary: 155 mg/dL — ABNORMAL HIGH (ref 65–99)

## 2015-07-09 SURGERY — TURP (TRANSURETHRAL RESECTION OF PROSTATE)
Anesthesia: General | Wound class: Clean Contaminated

## 2015-07-09 MED ORDER — LISINOPRIL 10 MG PO TABS
10.0000 mg | ORAL_TABLET | Freq: Every day | ORAL | Status: DC
  Administered 2015-07-10: 10 mg via ORAL
  Filled 2015-07-09: qty 1

## 2015-07-09 MED ORDER — KETAMINE HCL 50 MG/ML IJ SOLN
INTRAMUSCULAR | Status: DC | PRN
  Administered 2015-07-09: 40 mg via INTRAVENOUS

## 2015-07-09 MED ORDER — ROSUVASTATIN CALCIUM 10 MG PO TABS
5.0000 mg | ORAL_TABLET | Freq: Every day | ORAL | Status: DC
  Administered 2015-07-09: 5 mg via ORAL
  Filled 2015-07-09: qty 1

## 2015-07-09 MED ORDER — OXYBUTYNIN CHLORIDE 5 MG PO TABS: 5.0000 mg | ORAL_TABLET | Freq: Three times a day (TID) | ORAL | Status: DC | PRN

## 2015-07-09 MED ORDER — FENTANYL CITRATE (PF) 100 MCG/2ML IJ SOLN: 25.0000 ug | INTRAMUSCULAR | Status: DC | PRN

## 2015-07-09 MED ORDER — HEPARIN SODIUM (PORCINE) 5000 UNIT/ML IJ SOLN
5000.0000 [IU] | Freq: Three times a day (TID) | INTRAMUSCULAR | Status: DC
  Administered 2015-07-09 – 2015-07-10 (×3): 5000 [IU] via SUBCUTANEOUS
  Filled 2015-07-09 (×3): qty 1

## 2015-07-09 MED ORDER — HYDROCODONE-ACETAMINOPHEN 5-325 MG PO TABS: 1.0000 | ORAL_TABLET | Freq: Four times a day (QID) | ORAL | Status: DC | PRN

## 2015-07-09 MED ORDER — INSULIN ASPART 100 UNIT/ML ~~LOC~~ SOLN
0.0000 [IU] | Freq: Every day | SUBCUTANEOUS | Status: DC
  Administered 2015-07-09: 3 [IU] via SUBCUTANEOUS
  Filled 2015-07-09: qty 3

## 2015-07-09 MED ORDER — MORPHINE SULFATE (PF) 2 MG/ML IV SOLN: 2.0000 mg | INTRAVENOUS | Status: DC | PRN

## 2015-07-09 MED ORDER — SODIUM CHLORIDE 0.9 % IV SOLN
INTRAVENOUS | Status: DC
  Administered 2015-07-09: 09:00:00 via INTRAVENOUS

## 2015-07-09 MED ORDER — INSULIN ASPART 100 UNIT/ML ~~LOC~~ SOLN
4.0000 [IU] | Freq: Three times a day (TID) | SUBCUTANEOUS | Status: DC
Start: 2015-07-09 — End: 2015-07-10
  Administered 2015-07-09 – 2015-07-10 (×2): 4 [IU] via SUBCUTANEOUS
  Filled 2015-07-09 (×2): qty 4

## 2015-07-09 MED ORDER — METOPROLOL SUCCINATE ER 25 MG PO TB24
25.0000 mg | ORAL_TABLET | Freq: Every day | ORAL | Status: DC
  Administered 2015-07-10: 25 mg via ORAL
  Filled 2015-07-09: qty 1

## 2015-07-09 MED ORDER — ONDANSETRON HCL 4 MG/2ML IJ SOLN: 4.0000 mg | INTRAMUSCULAR | Status: DC | PRN

## 2015-07-09 MED ORDER — DIPHENHYDRAMINE HCL 12.5 MG/5ML PO ELIX
12.5000 mg | ORAL_SOLUTION | Freq: Four times a day (QID) | ORAL | Status: DC | PRN
  Filled 2015-07-09: qty 5

## 2015-07-09 MED ORDER — TAMSULOSIN HCL 0.4 MG PO CAPS
0.4000 mg | ORAL_CAPSULE | Freq: Every day | ORAL | Status: DC
  Administered 2015-07-09 – 2015-07-10 (×2): 0.4 mg via ORAL
  Filled 2015-07-09 (×2): qty 1

## 2015-07-09 MED ORDER — GLYCOPYRROLATE 0.2 MG/ML IJ SOLN
INTRAMUSCULAR | Status: DC | PRN
  Administered 2015-07-09: 0.1 mg via INTRAVENOUS
  Administered 2015-07-09: 0.2 mg via INTRAVENOUS

## 2015-07-09 MED ORDER — DIPHENHYDRAMINE HCL 50 MG/ML IJ SOLN: 12.5000 mg | Freq: Four times a day (QID) | INTRAMUSCULAR | Status: DC | PRN

## 2015-07-09 MED ORDER — SODIUM CHLORIDE 0.9 % IV SOLN
INTRAVENOUS | Status: DC
  Administered 2015-07-09 – 2015-07-10 (×3): via INTRAVENOUS

## 2015-07-09 MED ORDER — ONDANSETRON HCL 4 MG/2ML IJ SOLN: 4.0000 mg | Freq: Once | INTRAMUSCULAR | Status: DC | PRN

## 2015-07-09 MED ORDER — DOCUSATE SODIUM 100 MG PO CAPS: 100.0000 mg | ORAL_CAPSULE | Freq: Two times a day (BID) | ORAL | Status: DC

## 2015-07-09 MED ORDER — ACETAMINOPHEN 325 MG PO TABS: 650.0000 mg | ORAL_TABLET | ORAL | Status: DC | PRN

## 2015-07-09 MED ORDER — FENTANYL CITRATE (PF) 100 MCG/2ML IJ SOLN
INTRAMUSCULAR | Status: DC | PRN
  Administered 2015-07-09: 100 ug via INTRAVENOUS
  Administered 2015-07-09: 50 ug via INTRAVENOUS

## 2015-07-09 MED ORDER — METFORMIN HCL 500 MG PO TABS
1000.0000 mg | ORAL_TABLET | Freq: Two times a day (BID) | ORAL | Status: DC
  Administered 2015-07-09 – 2015-07-10 (×2): 1000 mg via ORAL
  Filled 2015-07-09 (×2): qty 2

## 2015-07-09 MED ORDER — CEFAZOLIN SODIUM 1-5 GM-% IV SOLN
1.0000 g | Freq: Three times a day (TID) | INTRAVENOUS | Status: AC
  Administered 2015-07-09 (×2): 1 g via INTRAVENOUS
  Filled 2015-07-09 (×3): qty 50

## 2015-07-09 MED ORDER — ACETAMINOPHEN 10 MG/ML IV SOLN
INTRAVENOUS | Status: AC
  Filled 2015-07-09: qty 100

## 2015-07-09 MED ORDER — OXYCODONE-ACETAMINOPHEN 5-325 MG PO TABS: 1.0000 | ORAL_TABLET | ORAL | Status: DC | PRN

## 2015-07-09 MED ORDER — BELLADONNA ALKALOIDS-OPIUM 16.2-60 MG RE SUPP: 1.0000 | Freq: Four times a day (QID) | RECTAL | Status: DC | PRN

## 2015-07-09 MED ORDER — AMLODIPINE BESYLATE 5 MG PO TABS
5.0000 mg | ORAL_TABLET | Freq: Every day | ORAL | Status: DC
  Administered 2015-07-10: 5 mg via ORAL
  Filled 2015-07-09: qty 1

## 2015-07-09 MED ORDER — LIDOCAINE HCL (CARDIAC) 20 MG/ML IV SOLN
INTRAVENOUS | Status: DC | PRN
  Administered 2015-07-09: 100 mg via INTRAVENOUS

## 2015-07-09 MED ORDER — PANTOPRAZOLE SODIUM 40 MG PO TBEC
40.0000 mg | DELAYED_RELEASE_TABLET | Freq: Every day | ORAL | Status: DC
  Administered 2015-07-09 – 2015-07-10 (×2): 40 mg via ORAL
  Filled 2015-07-09 (×2): qty 1

## 2015-07-09 MED ORDER — DEXAMETHASONE SODIUM PHOSPHATE 4 MG/ML IJ SOLN
INTRAMUSCULAR | Status: DC | PRN
  Administered 2015-07-09: 5 mg via INTRAVENOUS

## 2015-07-09 MED ORDER — ACETAMINOPHEN 10 MG/ML IV SOLN
INTRAVENOUS | Status: DC | PRN
  Administered 2015-07-09: 1000 mg via INTRAVENOUS

## 2015-07-09 MED ORDER — PROPOFOL 10 MG/ML IV BOLUS
INTRAVENOUS | Status: DC | PRN
  Administered 2015-07-09: 130 mg via INTRAVENOUS

## 2015-07-09 MED ORDER — DOCUSATE SODIUM 100 MG PO CAPS
100.0000 mg | ORAL_CAPSULE | Freq: Two times a day (BID) | ORAL | Status: DC
  Administered 2015-07-09 – 2015-07-10 (×3): 100 mg via ORAL
  Filled 2015-07-09 (×3): qty 1

## 2015-07-09 MED ORDER — CEFAZOLIN SODIUM-DEXTROSE 2-4 GM/100ML-% IV SOLN
INTRAVENOUS | Status: AC
  Filled 2015-07-09: qty 100

## 2015-07-09 MED ORDER — ALBUTEROL SULFATE HFA 108 (90 BASE) MCG/ACT IN AERS
INHALATION_SPRAY | RESPIRATORY_TRACT | Status: DC | PRN
  Administered 2015-07-09: 4 via RESPIRATORY_TRACT

## 2015-07-09 MED ORDER — INSULIN ASPART 100 UNIT/ML ~~LOC~~ SOLN
0.0000 [IU] | Freq: Three times a day (TID) | SUBCUTANEOUS | Status: DC
  Administered 2015-07-09: 8 [IU] via SUBCUTANEOUS
  Administered 2015-07-10: 3 [IU] via SUBCUTANEOUS
  Filled 2015-07-09: qty 8
  Filled 2015-07-09: qty 3

## 2015-07-09 SURGICAL SUPPLY — 28 items
ADAPTER IRRIG TUBE 2 SPIKE SOL (ADAPTER) IMPLANT
BAG DRAIN CYSTO-URO LG1000N (MISCELLANEOUS) ×3 IMPLANT
BAG URO DRAIN 2000ML W/SPOUT (MISCELLANEOUS) ×3 IMPLANT
BAG URO DRAIN 4000ML (MISCELLANEOUS) ×3 IMPLANT
CATH FOL 2WAY LX 24X30 (CATHETERS) IMPLANT
CATH FOL LEG HOLDER (MISCELLANEOUS) ×3 IMPLANT
CATH FOLEY 3WAY 30CC 22FR (CATHETERS) ×3 IMPLANT
DRAPE UTILITY 15X26 TOWEL STRL (DRAPES) ×3 IMPLANT
ELECT LOOP 22F BIPOLAR SML (ELECTROSURGICAL)
ELECT REM PT RETURN 9FT ADLT (ELECTROSURGICAL)
ELECTRODE LOOP 22F BIPOLAR SML (ELECTROSURGICAL) IMPLANT
ELECTRODE REM PT RTRN 9FT ADLT (ELECTROSURGICAL) IMPLANT
GLOVE BIO SURGEON STRL SZ 6.5 (GLOVE) ×2 IMPLANT
GLOVE BIO SURGEONS STRL SZ 6.5 (GLOVE) ×1
GOWN STRL REUS W/ TWL LRG LVL3 (GOWN DISPOSABLE) ×2 IMPLANT
GOWN STRL REUS W/TWL LRG LVL3 (GOWN DISPOSABLE) ×4
IV NS IRRIG 3000ML ARTHROMATIC (IV SOLUTION) ×21 IMPLANT
KIT RM TURNOVER CYSTO AR (KITS) ×3 IMPLANT
LOOP CUT BIPOLAR 24F LRG (ELECTROSURGICAL) ×3 IMPLANT
PACK CYSTO AR (MISCELLANEOUS) ×3 IMPLANT
PREP PVP WINGED SPONGE (MISCELLANEOUS) IMPLANT
SET CYSTO W/LG BORE CLAMP LF (SET/KITS/TRAYS/PACK) ×3 IMPLANT
SET IRRIGATING DISP (SET/KITS/TRAYS/PACK) ×3 IMPLANT
SOL .9 NS 3000ML IRR  AL (IV SOLUTION) ×12
SOL .9 NS 3000ML IRR UROMATIC (IV SOLUTION) ×6 IMPLANT
SYR TOOMEY 50ML (SYRINGE) ×3 IMPLANT
SYRINGE IRR TOOMEY STRL 70CC (SYRINGE) ×3 IMPLANT
WATER STERILE IRR 1000ML POUR (IV SOLUTION) ×3 IMPLANT

## 2015-07-09 NOTE — H&P (Signed)
Expand All Collapse All      10:26 AM  06/07/2015 --> updated 07/09/15 without change   Gregory Tran September 09, 1935 [id number]  Referring provider: Sallee Lange, NP [place], [place] [address]   HPI: 80 year old male with refractory BPH with LUTS who presents today for f/u UDS results.   He has been on finasteride and Flomax for quite some time. He was also prescribed Cialis daily but never able to fill this medication due to cost. He's never been on OAB medications. He is a diabetic.  He continues to have quite severe mixed urinary symptoms including obstructive and irritative voiding symptoms. This includes slow stream, incomplete bladder emptying, urinary urgency, frequency, and nocturia 2-3. No gross hematuria or UTIs.   PVR last visit 88 mL.  TRUS volume 45 cc on 05/2014. Cystoscopy showed obstructing appearing prostate with trilobar coaptation and a median lobe on retroflexion.  Most recent PSA 0.6 ng/dL on 01/26/2014.  Most recent IPSS, 26, bother 6  Urodynamics performed on 05/23/2015 at [facility] urology are consistent with bladder outlet obstruction. His first sensation was 120 mL's, normal desire to void at 252, strong desire at 397 mL's. He did have some unstable bladder contractions but no leakage. He was able to inhibit these unstable contractions. Pressure flow study indicate a voluntary contraction with a max flow rate of 5 and most perspective, detrusor pressure at max flow was 55 cm of water at which time he voided 127 cc with a postvoid residual of 185 cc. Calculated bladder outlet obstructive index was 44.8, within the obstructed range. EMG was normal. On fluoroscopy, there is evidence of elevation of the bladder base without reflux.  PMH: Past Medical History  Diagnosis Date  . Heart disease   . Hematuria   . Diabetes mellitus ([place])   . HTN (hypertension)   .  Hyperlipidemia   . Frequency   . BPH (benign prostatic hyperplasia)   . Microscopic hematuria   . Ejaculatory disorder   . Nocturia   . Gross hematuria   . Incomplete bladder emptying   . Erectile dysfunction   . Rotator cuff tear left    Surgical History: Past Surgical History  Procedure Laterality Date  . Rotator cuff repair Bilateral   . Cardiac stents      Home Medications:    Medication List       This list is accurate as of: 06/07/15 10:26 AM. Always use your most recent med list.              amLODipine 5 MG tablet  Commonly known as: NORVASC  TK 1 T PO ONCE A DAY     aspirin EC 81 MG tablet  Take by mouth.     clopidogrel 75 MG tablet  Commonly known as: PLAVIX  Take by mouth.     finasteride 5 MG tablet  Commonly known as: PROSCAR  Take 1 tablet (5 mg total) by mouth daily.     LEVEMIR FLEXTOUCH 100 UNIT/ML Pen  Generic drug: Insulin Detemir  INJECT 35 UNITS QAM AND 15 UNITS QPM     lisinopril 10 MG tablet  Commonly known as: PRINIVIL,ZESTRIL  TK 1 T PO QD     metFORMIN 1000 MG tablet  Commonly known as: GLUCOPHAGE  Take by mouth.     metoprolol succinate 50 MG 24 hr tablet  Commonly known as: TOPROL-XL  Take by mouth.     NOVOLOG FLEXPEN  100 UNIT/ML FlexPen  Generic drug: insulin aspart  INJ 10 UNITS [place] TID     pantoprazole 40 MG tablet  Commonly known as: PROTONIX     rosuvastatin 5 MG tablet  Commonly known as: CRESTOR     tamsulosin 0.4 MG Caps capsule  Commonly known as: FLOMAX  Take by mouth.        Allergies:  Allergies  Allergen Reactions  . B Complex Formula 1     Family History: Family History  Problem Relation Age of Onset  . Ovarian cancer Sister   . Bladder Cancer Neg Hx   . Prostate cancer Neg Hx   . Kidney disease Brother     born one kidney     Social History:  reports that he has been smoking. He does not have any smokeless tobacco history on file. He reports that he drinks alcohol. He reports that he does not use illicit drugs.  ROS: UROLOGY Frequent Urination?: Yes Hard to postpone urination?: Yes Burning/pain with urination?: No Get up at night to urinate?: Yes Leakage of urine?: Yes Urine stream starts and stops?: Yes Trouble starting stream?: Yes Do you have to strain to urinate?: No Blood in urine?: No Urinary tract infection?: No Sexually transmitted disease?: No Injury to kidneys or bladder?: No Painful intercourse?: No Weak stream?: Yes Erection problems?: No Penile pain?: No  Gastrointestinal Nausea?: No Vomiting?: No Indigestion/heartburn?: No Diarrhea?: No Constipation?: No  Constitutional Fever: No Night sweats?: No Weight loss?: No Fatigue?: No  Skin Skin rash/lesions?: No Itching?: No  Eyes Blurred vision?: No Double vision?: No  Ears/Nose/Throat Sore throat?: No Sinus problems?: No  Hematologic/Lymphatic Swollen glands?: No Easy bruising?: Yes  Cardiovascular Leg swelling?: No Chest pain?: No  Respiratory Cough?: No Shortness of breath?: No  Endocrine Excessive thirst?: No  Musculoskeletal Back pain?: No Joint pain?: No  Neurological Headaches?: No Dizziness?: Yes  Psychologic Depression?: No Anxiety?: No  Physical Exam: BP 166/73 mmHg  Pulse 60  Ht 5\' 8"  (1.727 m)  Wt 180 lb (81.647 kg)  BMI 27.38 kg/m2  Constitutional: Alert and oriented, No acute distress. HEENT: [place] AT, moist mucus membranes. Trachea midline, no masses. Cardiovascular: No clubbing, cyanosis, or edema. RRR. Respiratory: Normal respiratory effort, no increased work of breathing. CTAB. GI: Abdomen is soft, nontender, nondistended, no abdominal masses GU: Bilateral descended testicles. Left spermatocele noted, approximately 3 cm. Skin: No rashes, bruises or suspicious  lesions. Neurologic: Grossly intact, no focal deficits, moving all 4 extremities. Psychiatric: Normal mood and affect.  Laboratory Data:  Recent Labs    Lab Results  Component Value Date   WBC 5.7 06/20/2011   HGB 13.5 06/20/2011   HCT 38.6* 06/20/2011   MCV 96 06/20/2011   PLT 192 06/20/2011       Recent Labs    Lab Results  Component Value Date   CREATININE 0.84 06/20/2011      Assessment & Plan: 80 year old male with refractory obstructive and irritative voiding symptoms despite maximal medical therapy with finasteride and Flomax. UDS confirms bladder outlet obstruction with some mild bladder instability.  Lengthy discussion today about his various options. I do feel that he would benefit from an outlet procedure at this point. Again, unable to guarantee that this irritative voiding symptoms will improve or resolve completely.   Risks and benefits of TURP were reviewed today in detail. The preoperative, intraoperative, postoperative course were discussed. Risk of bleeding, infection, damage to surrounding structures, retrograde ejaculation, stress urinary incontinence were discussed  in detail. All this questions were answered. He would like to proceed with TURP.  1. BPH with obstruction/lower urinary tract symptoms As above  Will need cardiac clearance from to stop plavix/ ASA (history of cardiac stents). If unable to come off ASA, may consider HoLAP to reduce risk of bleeding.   2. OAB (overactive bladder)  As above  3. Left spermatocele Asymptomatic. No intervention recommended.  Schedule above procedure  Hollice Espy, MD  [facility] [address], [place], [place] [address] [phone]  I spent 30 min with this patient of which greater than 50% was spent in counseling and coordination of care with the patient.

## 2015-07-09 NOTE — Anesthesia Postprocedure Evaluation (Signed)
Anesthesia Post Note  Patient: Gregory Tran  Procedure(s) Performed: Procedure(s) (LRB): TRANSURETHRAL RESECTION OF THE PROSTATE (TURP) (N/A)  Patient location during evaluation: PACU Anesthesia Type: General Level of consciousness: awake Pain management: pain level controlled Vital Signs Assessment: post-procedure vital signs reviewed and stable Respiratory status: spontaneous breathing Cardiovascular status: stable Anesthetic complications: no    Last Vitals:  Filed Vitals:   07/09/15 1159 07/09/15 1243  BP: 141/74 135/61  Pulse: 55 54  Temp:  36.4 C  Resp: 16     Last Pain: There were no vitals filed for this visit.               VAN STAVEREN,Frankye Schwegel

## 2015-07-09 NOTE — Anesthesia Preprocedure Evaluation (Signed)
Anesthesia Evaluation  Patient identified by MRN, date of birth, ID band Patient awake    Reviewed: Allergy & Precautions, NPO status , Patient's Chart, lab work & pertinent test results  Airway Mallampati: II       Dental  (+) Teeth Intact, Caps   Pulmonary COPD, Current Smoker,     + decreased breath sounds      Cardiovascular Exercise Tolerance: Good hypertension, Pt. on medications and Pt. on home beta blockers + CAD and + Cardiac Stents   Rhythm:Regular Rate:Normal     Neuro/Psych negative neurological ROS     GI/Hepatic GERD  Medicated,  Endo/Other  diabetes, Well Controlled, Type 1, Insulin Dependent  Renal/GU      Musculoskeletal   Abdominal Normal abdominal exam  (+)   Peds  Hematology  (+) anemia ,   Anesthesia Other Findings   Reproductive/Obstetrics                             Anesthesia Physical Anesthesia Plan  ASA: III  Anesthesia Plan: General   Post-op Pain Management:    Induction: Intravenous  Airway Management Planned: LMA  Additional Equipment:   Intra-op Plan:   Post-operative Plan: Extubation in OR  Informed Consent: I have reviewed the patients History and Physical, chart, labs and discussed the procedure including the risks, benefits and alternatives for the proposed anesthesia with the patient or authorized representative who has indicated his/her understanding and acceptance.     Plan Discussed with: CRNA  Anesthesia Plan Comments:         Anesthesia Quick Evaluation

## 2015-07-09 NOTE — Op Note (Signed)
Date of procedure: 07/09/2015  Preoperative diagnosis:  1. BPH with bladder outlet obstruction   Postoperative diagnosis:  1. Same as above   Procedure: 1. Transurethral resection of the prostate  Surgeon: Hollice Espy, MD  Anesthesia: General  Complications: None  Intraoperative findings: A moderate-sized prostate with elevated bladder neck, mildly trabeculated bladder  EBL: 100 cc  Specimens: Prostate chips  Drains: 85 French three-way Foley catheter on CBI  Indication: Gregory Tran is a 80 y.o. patient with history of BPH with bladder outlet obstruction on maximal medical therapy. He underwent urodynamics to confirm this finding.  After reviewing the management options for treatment, he elected to proceed with the above surgical procedure(s). We have discussed the potential benefits and risks of the procedure, side effects of the proposed treatment, the likelihood of the patient achieving the goals of the procedure, and any potential problems that might occur during the procedure or recuperation. Informed consent has been obtained.  Description of procedure:  The patient was taken to the operating room and general anesthesia was induced.  The patient was placed in the dorsal lithotomy position, prepped and draped in the usual sterile fashion, and preoperative antibiotics were administered. A preoperative time-out was performed.   A 26 French bipolar resectoscope sheath was advanced using an obturator into the bladder without difficulty. Careful inspection of the bladder revealed a mildly trabeculated bladder with saccules present. His bladder neck was noted to be elevated. His trigone was a good distance from his bladder neck with normal UOs bilaterally. He had significant trilobar coaptation as well. This point in time, a bipolar loop was used to take down the bladder neck and remove all intravesical prostatic tissue. The posterior and lateral lobes were then taken down to  the level of the vera with care taken to avoid any resection beyond this area.  A few prostate stones were freed up from the posterior prostatic fossa. A small amount of anterior tissue was also resected. Hemostasis was excellent throughout. Prostate chips were evacuated from the bladder.  The loop was then used tissue careful hemostasis. This time, the prosthetic fossa was noted to be widely patent without any active bleeding. The scope was then removed and a 22 [place] three-way Foley was advanced per urethra into the bladder and the balloon was filled with 30 cc of sterile water. CBI was initiated. The patient was then cleaned and dried, repositioned the supine position, and taken to PACU in stable condition. His Foley catheter was secured to the left leg using a catheter secure. Remained hemodynamically stable throughout. His CBI was on a slow drip and very light pink.  Hollice Espy, M.D.

## 2015-07-09 NOTE — Anesthesia Procedure Notes (Signed)
Procedure Name: LMA Insertion Date/Time: 07/09/2015 9:18 AM Performed by: Rosaria Ferries, Tuvia Woodrick Pre-anesthesia Checklist: Patient identified, Emergency Drugs available, Suction available and Patient being monitored Patient Re-evaluated:Patient Re-evaluated prior to inductionOxygen Delivery Method: Circle system utilized Preoxygenation: Pre-oxygenation with 100% oxygen Intubation Type: IV induction LMA: LMA inserted LMA Size: 5.0 Number of attempts: 1 Tube secured with: Tape Dental Injury: Teeth and Oropharynx as per pre-operative assessment

## 2015-07-09 NOTE — Transfer of Care (Signed)
Immediate Anesthesia Transfer of Care Note  Patient: Gregory Tran  Procedure(s) Performed: Procedure(s): TRANSURETHRAL RESECTION OF THE PROSTATE (TURP) (N/A)  Patient Location: PACU  Anesthesia Type:General  Level of Consciousness: sedated  Airway & Oxygen Therapy: Patient Spontanous Breathing and Patient connected to nasal cannula oxygen  Post-op Assessment: Report given to RN and Post -op Vital signs reviewed and stable  Post vital signs: Reviewed and stable  Last Vitals:  Filed Vitals:   07/09/15 0810 07/09/15 1029  BP: 141/63 120/64  Pulse: 55 61  Temp: 36.7 C 36.2 C  Resp: 18 18    Last Pain: There were no vitals filed for this visit.       Complications: No apparent anesthesia complications

## 2015-07-09 NOTE — Progress Notes (Addendum)
Pharmacy Note  SCr 0.91 on 5/8; normalized CrCl ~69 ml/min No renal dose adjustment of antibiotics needed at this time.

## 2015-07-10 LAB — GLUCOSE, CAPILLARY: Glucose-Capillary: 181 mg/dL — ABNORMAL HIGH (ref 65–99)

## 2015-07-10 LAB — BASIC METABOLIC PANEL
Anion gap: 7 (ref 5–15)
BUN: 17 mg/dL (ref 6–20)
CHLORIDE: 108 mmol/L (ref 101–111)
CO2: 23 mmol/L (ref 22–32)
CREATININE: 0.75 mg/dL (ref 0.61–1.24)
Calcium: 8.6 mg/dL — ABNORMAL LOW (ref 8.9–10.3)
GFR calc Af Amer: >60 mL/min (ref >60)
GFR calc non Af Amer: >60 mL/min (ref >60)
GLUCOSE: 200 mg/dL — AB (ref 65–99)
POTASSIUM: 4.1 mmol/L (ref 3.5–5.1)
SODIUM: 138 mmol/L (ref 135–145)

## 2015-07-10 LAB — SURGICAL PATHOLOGY

## 2015-07-10 LAB — CBC
HCT: 35.3 % — ABNORMAL LOW (ref 40.0–52.0)
HEMOGLOBIN: 12.3 g/dL — AB (ref 13.0–18.0)
MCH: 32.6 pg (ref 26.0–34.0)
MCHC: 34.8 g/dL (ref 32.0–36.0)
MCV: 93.9 fL (ref 80.0–100.0)
Platelets: 192 10*3/uL (ref 150–440)
RBC: 3.76 MIL/uL — AB (ref 4.40–5.90)
RDW: 13.7 % (ref 11.5–14.5)
WBC: 8.6 10*3/uL (ref 3.8–10.6)

## 2015-07-10 NOTE — Progress Notes (Signed)
07/10/2015 11:56 AM  BP 131/53 mmHg  Pulse 59  Temp(Src) 98.2 F (36.8 C) (Oral)  Resp 20  Ht 5\' 8"  (1.727 m)  Wt 81.647 kg (180 lb)  BMI 27.38 kg/m2  SpO2 96% Patient discharged per MD orders. Discharge instructions reviewed with patient and patient verbalized understanding. IV removed per policy. Prescriptions discussed and given to patient. Discharged via wheelchair escorted by auxilary.  Almedia Balls, RN

## 2015-07-10 NOTE — Discharge Summary (Signed)
Date of admission: 07/09/2015  Date of discharge: 07/10/2015  Admission diagnosis: BPH with bladder outlet obstruction, history of urinary retention  Discharge diagnosis: Same as above  Secondary diagnoses:  Patient Active Problem List   Diagnosis Date Noted  . BPH (benign prostatic hypertrophy) with urinary obstruction 07/09/2015  . Benign fibroma of prostate 05/02/2015  . Arteriosclerosis of coronary artery 05/02/2015  . Chronic LBP 05/02/2015  . Type 2 diabetes mellitus ([place]) 05/02/2015  . Gastro-esophageal reflux disease without esophagitis 05/02/2015  . HLD (hyperlipidemia) 05/02/2015  . BP (high blood pressure) 05/02/2015  . Anemia, iron deficiency 05/02/2015  . Contracture of palmar fascia (Dupuytren's) 04/10/2015  . Impingement syndrome of left shoulder 03/26/2015  . Pain in shoulder 01/23/2015  . BPH with obstruction/lower urinary tract symptoms 10/16/2014  . Ejaculatory disorder 10/16/2014  . Low back pain with sciatica 04/10/2014    History and Physical: For full details, please see admission history and physical. Briefly, Gregory Tran is a 80 y.o. year old patient with BPH with history of bladder outlet obstruction and urinary retention who underwent TURP.Marland Kitchen   Hospital Course: Patient tolerated the procedure well.  He was then transferred to the floor after an uneventful PACU stay. She remained on CBI overnight. This was stopped on the morning of postop day 1 and remained clear. His catheter was subsequently removed and he was able to void multiple times without difficulty. His hospital course was uncomplicated.  On POD#1  he had met discharge criteria: was eating a regular diet, was up and ambulating independently,  pain was well controlled, was voiding without a catheter, and was ready to for discharge.   Laboratory values:   Recent Labs  07/10/15 0524  WBC 8.6  HGB 12.3*  HCT 35.3*    Recent Labs  07/10/15 0524  NA 138  K 4.1  CL 108  CO2 23   GLUCOSE 200*  BUN 17  CREATININE 0.75  CALCIUM 8.6*   No results for input(s): LABPT, INR in the last 72 hours. No results for input(s): LABURIN in the last 72 hours. Results for orders placed or performed in visit on 06/07/15  CULTURE, URINE COMPREHENSIVE     Status: None   Collection Time: 06/07/15  8:50 AM  Result Value Ref Range Status   Urine Culture, Comprehensive Final report  Final   Result 1 Comment  Final    Comment: No growth in 36 - 48 hours.  Microscopic Examination     Status: None   Collection Time: 06/07/15  8:50 AM  Result Value Ref Range Status   WBC, UA 0-5 0 -  5 /hpf Final   RBC, UA 0-2 0 -  2 /hpf Final   Epithelial Cells (non renal) 0-10 0 - 10 /hpf Final   Bacteria, UA None seen None seen/Few Final    Disposition: Home  Discharge instruction: The patient was instructed to be ambulatory but told to refrain from heavy lifting, strenuous activity, or driving while taking narcotics.    Discharge medications:   Medication List    STOP taking these medications        finasteride 5 MG tablet  Commonly known as:  PROSCAR      TAKE these medications        amLODipine 5 MG tablet  Commonly known as:  NORVASC  TK 1 T PO ONCE A DAY     aspirin EC 81 MG tablet  Take 162 mg by mouth daily.  clopidogrel 75 MG tablet  Commonly known as:  PLAVIX  Take 75 mg by mouth daily.     docusate sodium 100 MG capsule  Commonly known as:  COLACE  Take 1 capsule (100 mg total) by mouth 2 (two) times daily.     HYDROcodone-acetaminophen 5-325 MG tablet  Commonly known as:  NORCO/VICODIN  Take 1-2 tablets by mouth every 6 (six) hours as needed for moderate pain.     LEVEMIR FLEXTOUCH 100 UNIT/ML Pen  Generic drug:  Insulin Detemir  INJECT 50 UNITS QAM     lisinopril 10 MG tablet  Commonly known as:  PRINIVIL,ZESTRIL  TK 1 T PO QD     metFORMIN 1000 MG tablet  Commonly known as:  GLUCOPHAGE  Take 1,000 mg by mouth 2 (two) times daily with a meal.      metoprolol succinate 50 MG 24 hr tablet  Commonly known as:  TOPROL-XL  Take 25 mg by mouth daily.     NOVOLOG FLEXPEN 100 UNIT/ML FlexPen  Generic drug:  insulin aspart  INJ 12  UNITS [place] every morning and 10 units every evening     pantoprazole 40 MG tablet  Commonly known as:  PROTONIX  Take 40 mg by mouth daily.     rosuvastatin 5 MG tablet  Commonly known as:  CRESTOR  Take 5 mg by mouth daily at 6 PM.     tamsulosin 0.4 MG Caps capsule  Commonly known as:  FLOMAX  TAKE ONE CAPSULE BY MOUTH DAILY     Vitamin B1 100 MG Tabs  Take 250 mg by mouth every morning.        Followup:      Follow-up Information    Follow up with Hollice Espy, MD In 6 weeks.   Specialty:  Urology   Why:  uroflow/ PVR   Contact information:   [address] [place] [address] [phone]

## 2015-07-10 NOTE — Care Management (Signed)
Informed by care team no discharge needs identified.  Foley out and patient has voided.

## 2015-07-10 NOTE — Progress Notes (Signed)
Urology Consult Follow Up  Subjective: Patient s/p TURP on 07/09/2015 for BPH with BOO.  He is resting comfortably.  He reports no problems overnight.  Urine is clear and CBI is turned off.  He denies any pain.  VSS afebrile.  Patient would like to go home as soon as it is appropriate.    Anti-infectives: Anti-infectives    Start     Dose/Rate Route Frequency Ordered Stop   07/09/15 1400  ceFAZolin (ANCEF) IVPB 1 g/50 mL premix     1 g 100 mL/hr over 30 Minutes Intravenous Every 8 hours 07/09/15 1240 07/09/15 2310   07/09/15 0827  ceFAZolin (ANCEF) 2-4 GM/100ML-% IVPB    Comments:  Rexanne Mano: cabinet override      07/09/15 0827 07/09/15 2029   07/08/15 2300  ceFAZolin (ANCEF) IVPB 2g/100 mL premix     2 g 200 mL/hr over 30 Minutes Intravenous  Once 07/08/15 2256 07/09/15 0935      Current Facility-Administered Medications  Medication Dose Route Frequency Provider Last Rate Last Dose  . 0.9 %  sodium chloride infusion   Intravenous Continuous Hollice Espy, MD 125 mL/hr at 07/10/15 [phone]    . acetaminophen (TYLENOL) tablet 650 mg  650 mg Oral Q4H PRN Hollice Espy, MD      . amLODipine (NORVASC) tablet 5 mg  5 mg Oral Daily Hollice Espy, MD      . diphenhydrAMINE (BENADRYL) injection 12.5 mg  12.5 mg Intravenous Q6H PRN Hollice Espy, MD       Or  . diphenhydrAMINE (BENADRYL) 12.5 MG/5ML elixir 12.5 mg  12.5 mg Oral Q6H PRN Hollice Espy, MD      . docusate sodium (COLACE) capsule 100 mg  100 mg Oral BID Hollice Espy, MD   100 mg at 07/09/15 2133  . heparin injection 5,000 Units  5,000 Units Subcutaneous Q8H Hollice Espy, MD   5,000 Units at 07/10/15 0501  . insulin aspart (novoLOG) injection 0-15 Units  0-15 Units Subcutaneous TID WC Hollice Espy, MD   8 Units at 07/09/15 1727  . insulin aspart (novoLOG) injection 0-5 Units  0-5 Units Subcutaneous QHS Hollice Espy, MD   3 Units at 07/09/15 2133  . insulin aspart (novoLOG) injection 4 Units  4 Units Subcutaneous TID  WC Hollice Espy, MD   4 Units at 07/09/15 1728  . lisinopril (PRINIVIL,ZESTRIL) tablet 10 mg  10 mg Oral Daily Hollice Espy, MD      . metFORMIN (GLUCOPHAGE) tablet 1,000 mg  1,000 mg Oral BID WC Hollice Espy, MD   1,000 mg at 07/09/15 1728  . metoprolol succinate (TOPROL-XL) 24 hr tablet 25 mg  25 mg Oral Daily Hollice Espy, MD      . morphine 2 MG/ML injection 2-4 mg  2-4 mg Intravenous Q2H PRN Hollice Espy, MD      . ondansetron [facility]) injection 4 mg  4 mg Intravenous Q4H PRN Hollice Espy, MD      . opium-belladonna (B&O SUPPRETTES) 16.2-60 MG suppository 1 suppository  1 suppository Rectal Q6H PRN Hollice Espy, MD      . oxybutynin (DITROPAN) tablet 5 mg  5 mg Oral Q8H PRN Hollice Espy, MD      . oxyCODONE-acetaminophen (PERCOCET/ROXICET) 5-325 MG per tablet 1-2 tablet  1-2 tablet Oral Q4H PRN Hollice Espy, MD      . pantoprazole (PROTONIX) EC tablet 40 mg  40 mg Oral Daily Hollice Espy, MD   40 mg at 07/09/15 1610  . rosuvastatin (CRESTOR)  tablet 5 mg  5 mg Oral q1800 Hollice Espy, MD   5 mg at 07/09/15 1728  . tamsulosin (FLOMAX) capsule 0.4 mg  0.4 mg Oral Daily Hollice Espy, MD   0.4 mg at 07/09/15 1610     Objective: Vital signs in last 24 hours: Temp:  [97.1 F (36.2 C)-98.2 F (36.8 C)] 98.2 F (36.8 C) (05/23 0630) Pulse Rate:  [54-61] 59 (05/23 0630) Resp:  [10-20] 20 (05/23 0630) BP: (120-141)/(53-74) 131/53 mmHg (05/23 0630) SpO2:  [93 %-100 %] 96 % (05/23 0630) Weight:  [180 lb (81.647 kg)] 180 lb (81.647 kg) (05/22 1720)  Intake/Output from previous day: 05/22 0701 - 05/23 0700 In: [id number] [P.O.:540; I.V.:2971; IV Piggyback:50] Out: [id number] [Urine:15025; Blood:100] Intake/Output this shift: Total I/O In: 3000 [Other:3000] Out: 3100 [Urine:3100]   Physical Exam Constitutional: Well nourished. Alert and oriented, No acute distress. HEENT: [place] AT, moist mucus membranes. Trachea midline, no masses. Cardiovascular: No clubbing, cyanosis, or  edema. Respiratory: Normal respiratory effort, no increased work of breathing. GI: Abdomen is soft, non tender, non distended, no abdominal masses. Liver and spleen not palpable.   GU: No CVA tenderness.  No bladder fullness or masses.  Foley in place with clear urine.  CBI at a slow drip, now it is turned off.   Skin: No rashes, bruises or suspicious lesions. Lymph: No cervical or inguinal adenopathy. Neurologic: Grossly intact, no focal deficits, moving all 4 extremities. Psychiatric: Normal mood and affect.  Lab Results:   Recent Labs  07/10/15 0524  WBC 8.6  HGB 12.3*  HCT 35.3*  PLT 192   BMET  Recent Labs  07/10/15 0524  NA 138  K 4.1  CL 108  CO2 23  GLUCOSE 200*  BUN 17  CREATININE 0.75  CALCIUM 8.6*   PT/INR No results for input(s): LABPROT, INR in the last 72 hours. ABG No results for input(s): PHART, HCO3 in the last 72 hours.  Invalid input(s): PCO2, PO2  Studies/Results: No results found.   Assessment: s/p Procedure(s): TRANSURETHRAL RESECTION OF THE PROSTATE (TURP) Patient is s/p TURP on 07/09/2015 for BPH with BOO.  No problems overnight.  CBI is turned off.    Plan: will recheck the urine later today after a trial of no CBI      LOS: 1 day    [facility] 07/10/2015

## 2015-07-10 NOTE — Progress Notes (Signed)
Dr. Erlene Quan notified of post void residual of 157 ml after voiding 150 ml. MD order to discharge patient home.

## 2015-07-19 ENCOUNTER — Telehealth: Payer: Self-pay

## 2015-07-19 NOTE — Telephone Encounter (Signed)
Pt called c/o bleeding since surgery. Reinforced with pt bleeding after a TURP is normal and to drink plenty of fluids to help flush his system. Pt voiced understanding stating he will call back early next week if things arent any better.

## 2015-07-23 ENCOUNTER — Telehealth: Payer: Self-pay

## 2015-07-23 NOTE — Telephone Encounter (Signed)
Pt called stating he has been bleeding since surgery, TURP. Reinforced with pt to continue to drink plenty of fluids. Pt voiced understanding and requested Dr. Erlene Quan be made aware.

## 2015-07-23 NOTE — Telephone Encounter (Signed)
Recommend UA/ UCx if associated burning or fevers/ chills or not resolved

## 2015-08-29 ENCOUNTER — Ambulatory Visit: Payer: Medicare Other | Admitting: Urology

## 2015-09-17 ENCOUNTER — Other Ambulatory Visit: Payer: Self-pay | Admitting: Urology

## 2015-09-26 ENCOUNTER — Ambulatory Visit (INDEPENDENT_AMBULATORY_CARE_PROVIDER_SITE_OTHER): Payer: Medicare Other | Admitting: Urology

## 2015-09-26 ENCOUNTER — Encounter: Payer: Self-pay | Admitting: Urology

## 2015-09-26 VITALS — BP 169/65 | HR 56 | Ht 68.0 in | Wt 175.4 lb

## 2015-09-26 DIAGNOSIS — N401 Enlarged prostate with lower urinary tract symptoms: Secondary | ICD-10-CM

## 2015-09-26 DIAGNOSIS — N3281 Overactive bladder: Secondary | ICD-10-CM

## 2015-09-26 DIAGNOSIS — N138 Other obstructive and reflux uropathy: Secondary | ICD-10-CM

## 2015-09-26 DIAGNOSIS — N4340 Spermatocele of epididymis, unspecified: Secondary | ICD-10-CM

## 2015-09-26 LAB — BLADDER SCAN AMB NON-IMAGING: Scan Result: 79

## 2015-09-26 NOTE — Progress Notes (Signed)
10:23 AM  09/26/15  Gregory Tran 16-Dec-1935 [id number]  Referring provider: Sallee Lange, NP [place], [place] [address]   HPI: 80 year old male with refractory BPH with LUTS s/p TURP on 07/09/15 who returns for routine follow up.    He had been in finasteride and flomax for quite some time prior to surgery.   He stopped his finasteride after surgery but continues Flomax.  Preop, he had quite severe mixed urinary symptoms including obstructive and irritative voiding symptoms. This includes slow stream, incomplete bladder emptying, urinary urgency, frequency, and nocturia 2-3.  He did also have UDS which showed overactivity and evidence of BOO (BOOI 44.8).  Today, he thinks that his stream is somewhat better but continues to have severe urinary urgency and frequency.  He's also had a few episodes of urge incontinence. He is fairly unhappy overall with his urinary status.  He denies dysuria or gross hematuria.    Surgical pathology with glandular and stromal hyperplasia, mild chronic inflammation.  10 g resected. TRUS volume 45 cc.       IPSS    Row Name 09/26/15 1000         International Prostate Symptom Score   How often have you had the sensation of not emptying your bladder? Less than 1 in 5     How often have you had to urinate less than every two hours? More than half the time     How often have you found you stopped and started again several times when you urinated? Less than 1 in 5 times     How often have you found it difficult to postpone urination? Almost always     How often have you had a weak urinary stream? About half the time     How often have you had to strain to start urination? Less than 1 in 5 times     How many times did you typically get up at night to urinate? 3 Times     Total IPSS Score 18       Quality of Life due to urinary symptoms   If you were to spend the rest of your life with  your urinary condition just the way it is now how would you feel about that? Unhappy        Score:  1-7 Mild 8-19 Moderate 20-35 Severe    PMH: Past Medical History:  Diagnosis Date  . Arthritis   . BPH (benign prostatic hyperplasia)   . Cancer (HCC)    Basal Cell  . Chronic kidney disease   . Coronary artery disease   . Diabetes mellitus ([place])   . Ejaculatory disorder   . Erectile dysfunction   . Frequency   . GERD (gastroesophageal reflux disease)   . Gross hematuria   . Heart disease   . Hematuria   . HTN (hypertension)   . Hyperlipidemia   . Incomplete bladder emptying   . Microscopic hematuria   . Neuropathy ([place])   . Nocturia   . Rotator cuff tear left  . Tick bite of multiple sites 5 days ago   chest, and groin area    Surgical History: Past Surgical History:  Procedure Laterality Date  . cardiac stents    . CORONARY ANGIOPLASTY  2001, 2008   [facility]  . ROTATOR CUFF REPAIR Bilateral   . TRANSURETHRAL RESECTION OF PROSTATE N/A 07/09/2015   Procedure: TRANSURETHRAL RESECTION OF  THE PROSTATE (TURP);  Surgeon: Hollice Espy, MD;  Location: [facility] ORS;  Service: Urology;  Laterality: N/A;    Home Medications:    Medication List       Accurate as of 09/26/15 10:23 AM. Always use your most recent med list.          amLODipine 5 MG tablet Commonly known as:  NORVASC TK 1 T PO ONCE A DAY   aspirin EC 81 MG tablet Take 162 mg by mouth daily.   clopidogrel 75 MG tablet Commonly known as:  PLAVIX Take 75 mg by mouth daily.   LEVEMIR FLEXTOUCH 100 UNIT/ML Pen Generic drug:  Insulin Detemir INJECT 50 UNITS QAM   lisinopril 10 MG tablet Commonly known as:  PRINIVIL,ZESTRIL TK 1 T PO QD   metFORMIN 1000 MG tablet Commonly known as:  GLUCOPHAGE Take 1,000 mg by mouth 2 (two) times daily with a meal.   metoprolol succinate 50 MG 24 hr tablet Commonly known as:  TOPROL-XL Take 25 mg by mouth daily.   NOVOLOG FLEXPEN 100 UNIT/ML FlexPen Generic  drug:  insulin aspart INJ 12  UNITS [place] every morning and 10 units every evening   pantoprazole 40 MG tablet Commonly known as:  PROTONIX Take 40 mg by mouth daily.   rosuvastatin 5 MG tablet Commonly known as:  CRESTOR Take 5 mg by mouth daily at 6 PM.   Vitamin B1 100 MG Tabs Take 250 mg by mouth every morning.       Allergies:  Allergies  Allergen Reactions  . B Complex Formula 1 Hives    Family History: Family History  Problem Relation Age of Onset  . Ovarian cancer Sister   . Bladder Cancer Neg Hx   . Prostate cancer Neg Hx   . Kidney disease Brother     born one kidney    Social History:  reports that he has been smoking Cigarettes.  He has been smoking about 1.00 pack per day. He has never used smokeless tobacco. He reports that he drinks alcohol. He reports that he does not use drugs.  ROS: UROLOGY Frequent Urination?: Yes Hard to postpone urination?: Yes Burning/pain with urination?: No Get up at night to urinate?: Yes Leakage of urine?: No Urine stream starts and stops?: No Trouble starting stream?: No Do you have to strain to urinate?: No Blood in urine?: No Urinary tract infection?: No Sexually transmitted disease?: No Injury to kidneys or bladder?: No Painful intercourse?: No Weak stream?: Yes Erection problems?: Yes Penile pain?: No  Gastrointestinal Nausea?: No Vomiting?: No Indigestion/heartburn?: No Diarrhea?: No Constipation?: No  Constitutional Fever: No Night sweats?: No Weight loss?: No Fatigue?: No  Skin Skin rash/lesions?: No Itching?: No  Eyes Blurred vision?: No Double vision?: No  Ears/Nose/Throat Sore throat?: No Sinus problems?: No  Hematologic/Lymphatic Swollen glands?: No Easy bruising?: No  Cardiovascular Leg swelling?: No Chest pain?: No  Respiratory Cough?: No Shortness of breath?: No  Endocrine Excessive thirst?: No  Musculoskeletal Back pain?: No Joint pain?:  No  Neurological Headaches?: No Dizziness?: No  Psychologic Depression?: No Anxiety?: No  Physical Exam: BP (!) 169/65 (BP Location: Left Arm, Patient Position: Sitting, Cuff Size: Normal)   Pulse (!) 56   Ht 5\' 8"  (1.727 m)   Wt 175 lb 6.4 oz (79.6 kg)   BMI 26.67 kg/m   Constitutional:  Alert and oriented, No acute distress. HEENT: [place] AT, moist mucus membranes.  Trachea midline, no masses. Cardiovascular: No clubbing, cyanosis, or edema.  Respiratory: Normal respiratory effort, no increased work of breathing.  C Skin: No rashes, bruises or suspicious lesions. Neurologic: Grossly intact, no focal deficits, moving all 4 extremities. Psychiatric: Normal mood and affect.  Laboratory Data: Lab Results  Component Value Date   WBC 8.6 07/10/2015   HGB 12.3 (L) 07/10/2015   HCT 35.3 (L) 07/10/2015   MCV 93.9 07/10/2015   PLT 192 07/10/2015    Lab Results  Component Value Date   CREATININE 0.75 07/10/2015    Results for orders placed or performed in visit on 09/26/15  Bladder Scan (Post Void Residual) in office  Result Value Ref Range   Scan Result 79     Assessment & Plan:  80 year old male with refractory obstructive and irritative voiding symptoms despite maximal medical therapy with finasteride and Flomax. UDS confirmed bladder outlet obstruction with some mild bladder instability.    Today, refractory OAB symptoms.  PVR <100 cc.     1. BPH with obstruction/lower urinary tract symptoms S/p TURP with improved obstructive symptoms Continues to have severe OAB symptoms Stop BPH meds inclduing finasteride/ flomax  2. OAB (overactive bladder) / urge incotntience Trial of Toviaz 4 mg --> 8 mg given Advised to call with efficacy- will adjust to different meds or call in script Reviewed side effects of medicatoin  3. Left spermatocele Asymptomatic. No intervention recommended.  Return in about 3 months (around 12/27/2015) for IPSS, PVR. Hollice Espy,  MD  [facility] [address] [place], [place] [address] [phone]

## 2015-10-08 ENCOUNTER — Telehealth: Payer: Self-pay | Admitting: Urology

## 2015-10-08 MED ORDER — FESOTERODINE FUMARATE ER 4 MG PO TB24: 4.0000 mg | ORAL_TABLET | Freq: Every day | ORAL | 3 refills | Status: DC

## 2015-10-08 NOTE — Telephone Encounter (Signed)
Patient called the office this morning to report that he has been taking Toviaz for approximately 10 days.  The medication seems to be working well for him.  He has seen some improvement in his symptoms.  He wants to know if he should double up on the dosage, get more samples, or have a prescription called into his pharmacy.  Please advise.

## 2015-10-08 NOTE — Telephone Encounter (Signed)
Great!  We will keep him on this medication. A prescription was sent to his pharmacy. We will discuss potentially increasing the dose after his next follow-up and check a postvoid residual to ensure that he is emptying well on this medication.  Please let him know  Hollice Espy, MD

## 2015-10-09 NOTE — Telephone Encounter (Signed)
LMOM

## 2015-10-10 NOTE — Telephone Encounter (Signed)
Spoke with pt in reference to medication and f/u visit. Pt voiced understanding.

## 2015-12-28 ENCOUNTER — Ambulatory Visit (INDEPENDENT_AMBULATORY_CARE_PROVIDER_SITE_OTHER): Payer: Medicare Other | Admitting: Urology

## 2015-12-28 ENCOUNTER — Encounter: Payer: Self-pay | Admitting: Urology

## 2015-12-28 VITALS — BP 159/83 | HR 53 | Ht 68.0 in | Wt 175.5 lb

## 2015-12-28 DIAGNOSIS — N401 Enlarged prostate with lower urinary tract symptoms: Secondary | ICD-10-CM

## 2015-12-28 DIAGNOSIS — N3281 Overactive bladder: Secondary | ICD-10-CM | POA: Diagnosis not present

## 2015-12-28 DIAGNOSIS — N138 Other obstructive and reflux uropathy: Secondary | ICD-10-CM

## 2015-12-28 DIAGNOSIS — R339 Retention of urine, unspecified: Secondary | ICD-10-CM

## 2015-12-28 LAB — BLADDER SCAN AMB NON-IMAGING: SCAN RESULT: 219

## 2015-12-28 MED ORDER — TAMSULOSIN HCL 0.4 MG PO CAPS: 0.4000 mg | ORAL_CAPSULE | Freq: Every day | ORAL | 11 refills | Status: DC

## 2015-12-28 MED ORDER — TAMSULOSIN HCL 0.4 MG PO CAPS: 0.4000 mg | ORAL_CAPSULE | Freq: Every day | ORAL | 0 refills | Status: DC

## 2015-12-28 NOTE — Progress Notes (Signed)
11:22 AM  12/28/15  CONG MATHESON 04-11-1935 [id number]  Referring provider: Sallee Lange, NP [place], [place] [address]   HPI: 80 year old male with refractory BPH with LUTS s/p TURP on 07/09/15.  Preop, he had quite severe mixed urinary symptoms including obstructive and irritative voiding symptoms. This includes slow stream, incomplete bladder emptying, urinary urgency, frequency, and nocturia 2-3.  He did also have UDS which showed overactivity and evidence of BOO (BOOI 44.8).  TRUS vol preop 45 cc.    After surgery, he stopped his Flomax and finasteride.  Postop, he continued to have severe urinary urgency and frequency and has been taking Toviaz. He has difficult affording the medication and therefore only takes 4 mg every other day for cost issues. He does think that this helped some with his irritative voiding symptoms but continues to have some urgency for events.  Overall, his flow has improved but he has had some recurrence and intermittency over the past month or so.  He has had fairly significant improvement with his nighttime symptoms and now only gets up 1-2 times each night which is improved from 4-5 prior to surgery.  He denies dysuria or gross hematuria.  No UTIs.  PVR today 219 cc.        IPSS    Row Name 12/28/15 0900         International Prostate Symptom Score   How often have you had the sensation of not emptying your bladder? About half the time     How often have you had to urinate less than every two hours? About half the time     How often have you found you stopped and started again several times when you urinated? Less than half the time     How often have you found it difficult to postpone urination? Less than half the time     How often have you had a weak urinary stream? About half the time     How often have you had to strain to start urination? About half the time     How many  times did you typically get up at night to urinate? 2 Times     Total IPSS Score 18       Quality of Life due to urinary symptoms   If you were to spend the rest of your life with your urinary condition just the way it is now how would you feel about that? Mixed          Score:  1-7 Mild 8-19 Moderate 20-35 Severe    PMH: Past Medical History:  Diagnosis Date  . Arthritis   . BPH (benign prostatic hyperplasia)   . Cancer (HCC)    Basal Cell  . Chronic kidney disease   . Coronary artery disease   . Diabetes mellitus ([place])   . Ejaculatory disorder   . Erectile dysfunction   . Frequency   . GERD (gastroesophageal reflux disease)   . Gross hematuria   . Heart disease   . Hematuria   . HTN (hypertension)   . Hyperlipidemia   . Incomplete bladder emptying   . Microscopic hematuria   . Neuropathy ([place])   . Nocturia   . Rotator cuff tear left  . Tick bite of multiple sites 5 days ago   chest, and groin area    Surgical History: Past Surgical History:  Procedure Laterality Date  .  cardiac stents    . CORONARY AN[facility]OPLASTY  2001, 2008   [facility]  . ROTATOR CUFF REPAIR Bilateral   . TRANSURETHRAL RESECTION OF PROSTATE N/A 07/09/2015   Procedure: TRANSURETHRAL RESECTION OF THE PROSTATE (TURP);  Surgeon: Hollice Espy, MD;  Location: [facility] ORS;  Service: Urology;  Laterality: N/A;    Home Medications:    Medication List       Accurate as of 12/28/15 11:22 AM. Always use your most recent med list.          amLODipine 5 MG tablet Commonly known as:  NORVASC TK 1 T PO ONCE A DAY   aspirin EC 81 MG tablet Take 162 mg by mouth daily.   clopidogrel 75 MG tablet Commonly known as:  PLAVIX Take 75 mg by mouth daily.   fesoterodine 4 MG Tb24 tablet Commonly known as:  TOVIAZ Take 1 tablet (4 mg total) by mouth daily.   LEVEMIR FLEXTOUCH 100 UNIT/ML Pen Generic drug:  Insulin Detemir INJECT 50 UNITS QAM   lisinopril 10 MG tablet Commonly known as:   PRINIVIL,ZESTRIL TK 1 T PO QD   metFORMIN 1000 MG tablet Commonly known as:  GLUCOPHAGE Take 1,000 mg by mouth 2 (two) times daily with a meal.   metoprolol succinate 50 MG 24 hr tablet Commonly known as:  TOPROL-XL Take 25 mg by mouth daily.   NOVOLOG FLEXPEN 100 UNIT/ML FlexPen Generic drug:  insulin aspart INJ 12  UNITS [place] every morning and 10 units every evening   pantoprazole 40 MG tablet Commonly known as:  PROTONIX Take 40 mg by mouth daily.   rosuvastatin 5 MG tablet Commonly known as:  CRESTOR Take 5 mg by mouth daily at 6 PM.   tamsulosin 0.4 MG Caps capsule Commonly known as:  FLOMAX Take 1 capsule (0.4 mg total) by mouth daily.   Vitamin B1 100 MG Tabs Take 250 mg by mouth every morning.       Allergies:  Allergies  Allergen Reactions  . B Complex Formula 1 Hives    Family History: Family History  Problem Relation Age of Onset  . Ovarian cancer Sister   . Bladder Cancer Neg Hx   . Prostate cancer Neg Hx   . Kidney disease Brother     born one kidney    Social History:  reports that he has been smoking Cigarettes.  He has been smoking about 1.00 pack per day. He has never used smokeless tobacco. He reports that he drinks alcohol. He reports that he does not use drugs.  ROS: UROLOGY Frequent Urination?: Yes Hard to postpone urination?: No Burning/pain with urination?: No Get up at night to urinate?: Yes Leakage of urine?: No Urine stream starts and stops?: No Trouble starting stream?: Yes Do you have to strain to urinate?: No Blood in urine?: No Urinary tract infection?: No Sexually transmitted disease?: No Injury to kidneys or bladder?: No Painful intercourse?: No Weak stream?: No Erection problems?: Yes Penile pain?: No  Gastrointestinal Nausea?: No Vomiting?: No Indigestion/heartburn?: No Diarrhea?: No Constipation?: No  Constitutional Fever: No Night sweats?: No Weight loss?: No Fatigue?: No  Skin Skin rash/lesions?:  No Itching?: No  Eyes Blurred vision?: No Double vision?: No  Ears/Nose/Throat Sore throat?: No Sinus problems?: No  Hematologic/Lymphatic Swollen glands?: No Easy bruising?: No  Cardiovascular Leg swelling?: No Chest pain?: No  Respiratory Cough?: Yes Shortness of breath?: No  Endocrine Excessive thirst?: No  Musculoskeletal Back pain?: No Joint pain?: No  Neurological Headaches?: No  Dizziness?: No  Psychologic Depression?: No Anxiety?: No  Physical Exam: BP (!) 159/83   Pulse (!) 53   Ht 5\' 8"  (1.727 m)   Wt 175 lb 8 oz (79.6 kg)   BMI 26.68 kg/m   Constitutional:  Alert and oriented, No acute distress. HEENT: [place] AT, moist mucus membranes.  Trachea midline, no masses. Cardiovascular: No clubbing, cyanosis, or edema. Respiratory: Normal respiratory effort, no increased work of breathing.  Skin: No rashes, bruises or suspicious lesions. Neurologic: Grossly intact, no focal deficits, moving all 4 extremities. Psychiatric: Normal mood and affect.  Laboratory Data: Lab Results  Component Value Date   WBC 8.6 07/10/2015   HGB 12.3 (L) 07/10/2015   HCT 35.3 (L) 07/10/2015   MCV 93.9 07/10/2015   PLT 192 07/10/2015    Lab Results  Component Value Date   CREATININE 0.75 07/10/2015    Results for orders placed or performed in visit on 12/28/15  Bladder Scan (Post Void Residual) in office  Result Value Ref Range   Scan Result 219     Assessment & Plan:  80 year old with BPH with lower urinary tract symptoms both obstructive and irritative in nature. He is now status post TURP improvement is obstructing symptoms but continues to have evidence of incomplete bladder emptying with elevated postvoid residual today and irritative voiding symptoms including urgency/frequency.  1. BPH with obstruction/lower urinary tract symptoms/  incomplete bladder emptying S/p TURP with improved obstructive symptoms Continues to have severe OAB symptoms Resume Flomax  with recheck of postvoid residual in one month  2. OAB (overactive bladder) / urge incotntience Taking Toviaz 4 mg qod with some good result Continue daily- 2 weeks of additional samples was given today Consider change or stopping anticholinergics if postvoid residual remains elevated next visit   Return in about 1 month (around 01/27/2016) for PVR, IPSS.   Hollice Espy, MD  [facility] [address], [place], [place] [address] [phone]

## 2016-02-01 ENCOUNTER — Ambulatory Visit (INDEPENDENT_AMBULATORY_CARE_PROVIDER_SITE_OTHER): Payer: Medicare Other | Admitting: Urology

## 2016-02-01 ENCOUNTER — Encounter: Payer: Self-pay | Admitting: Urology

## 2016-02-01 VITALS — BP 151/71 | HR 61 | Ht 68.0 in | Wt 176.0 lb

## 2016-02-01 DIAGNOSIS — N401 Enlarged prostate with lower urinary tract symptoms: Secondary | ICD-10-CM

## 2016-02-01 DIAGNOSIS — N3281 Overactive bladder: Secondary | ICD-10-CM

## 2016-02-01 DIAGNOSIS — N138 Other obstructive and reflux uropathy: Secondary | ICD-10-CM

## 2016-02-01 DIAGNOSIS — R339 Retention of urine, unspecified: Secondary | ICD-10-CM | POA: Diagnosis not present

## 2016-02-01 LAB — BLADDER SCAN AMB NON-IMAGING

## 2016-02-01 NOTE — Progress Notes (Signed)
12:53 PM  02/01/16  Gregory Tran 07/29/1935 [id number]  Referring provider: Sallee Lange, NP [place], [place] [address]   HPI: 80 year old male with refractory BPH with LUTS s/p TURP on 07/09/15.  Preop, he had quite severe mixed urinary symptoms including obstructive and irritative voiding symptoms. This includes slow stream, incomplete bladder emptying, urinary urgency, frequency, and nocturia 2-3.  He did also have UDS which showed overactivity and evidence of BOO (BOOI 44.8).  TRUS vol preop 45 cc.    After surgery, he stopped his Flomax and finasteride.  Postop, he continued to have severe urinary urgency and frequency and has been taking Toviaz. He has difficult affording the medication and therefore only takes 4 mg every other day for cost issues and is now having constipation and dry mouth. He does think that this helped some with his irritative voiding symptoms but continues to have some urgency for events.    Last visit, he did have an elevated postvoid residual of 219 cc. He was restarted on his Flomax in addition to the [place] and returns today for recheck.  PVR today 128 cc.    He has had fairly significant improvement with his nighttime symptoms and now only gets up 1-2 times each night which is improved from 4-5 prior to surgery.  He denies dysuria or gross hematuria.  No UTIs.       IPSS    Row Name 12/28/15 0900 02/01/16 0800       International Prostate Symptom Score   How often have you had the sensation of not emptying your bladder? About half the time Less than 1 in 5    How often have you had to urinate less than every two hours? About half the time About half the time    How often have you found you stopped and started again several times when you urinated? Less than half the time Less than half the time    How often have you found it difficult to postpone urination? Less than half the time  Less than half the time    How often have you had a weak urinary stream? About half the time More than half the time    How often have you had to strain to start urination? About half the time Less than half the time    How many times did you typically get up at night to urinate? 2 Times 2 Times    Total IPSS Score 18 16      Quality of Life due to urinary symptoms   If you were to spend the rest of your life with your urinary condition just the way it is now how would you feel about that? Mixed Mixed         Score:  1-7 Mild 8-19 Moderate 20-35 Severe    PMH: Past Medical History:  Diagnosis Date  . Arthritis   . BPH (benign prostatic hyperplasia)   . Cancer (HCC)    Basal Cell  . Chronic kidney disease   . Coronary artery disease   . Diabetes mellitus ([place])   . Ejaculatory disorder   . Erectile dysfunction   . Frequency   . GERD (gastroesophageal reflux disease)   . Gross hematuria   . Heart disease   . Hematuria   . HTN (hypertension)   . Hyperlipidemia   . Incomplete bladder emptying   . Microscopic hematuria   .  Neuropathy ([place])   . Nocturia   . Rotator cuff tear left  . Tick bite of multiple sites 5 days ago   chest, and groin area    Surgical History: Past Surgical History:  Procedure Laterality Date  . cardiac stents    . CORONARY ANGIOPLASTY  2001, 2008   [facility]  . ROTATOR CUFF REPAIR Bilateral   . TRANSURETHRAL RESECTION OF PROSTATE N/A 07/09/2015   Procedure: TRANSURETHRAL RESECTION OF THE PROSTATE (TURP);  Surgeon: Hollice Espy, MD;  Location: [facility] ORS;  Service: Urology;  Laterality: N/A;    Home Medications:  Allergies as of 02/01/2016      Reactions   B Complex Formula 1 Hives      Medication List       Accurate as of 02/01/16 12:53 PM. Always use your most recent med list.          amLODipine 5 MG tablet Commonly known as:  NORVASC TK 1 T PO ONCE A DAY   aspirin EC 81 MG tablet Take 162 mg by mouth daily.   clopidogrel 75  MG tablet Commonly known as:  PLAVIX Take 75 mg by mouth daily.   fesoterodine 4 MG Tb24 tablet Commonly known as:  TOVIAZ Take 1 tablet (4 mg total) by mouth daily.   LEVEMIR FLEXTOUCH 100 UNIT/ML Pen Generic drug:  Insulin Detemir INJECT 50 UNITS QAM   lisinopril 10 MG tablet Commonly known as:  PRINIVIL,ZESTRIL TK 1 T PO QD   metFORMIN 1000 MG tablet Commonly known as:  GLUCOPHAGE Take 1,000 mg by mouth 2 (two) times daily with a meal.   metoprolol succinate 50 MG 24 hr tablet Commonly known as:  TOPROL-XL Take 25 mg by mouth daily.   NOVOLOG FLEXPEN 100 UNIT/ML FlexPen Generic drug:  insulin aspart INJ 12  UNITS [place] every morning and 10 units every evening   pantoprazole 40 MG tablet Commonly known as:  PROTONIX Take 40 mg by mouth daily.   rosuvastatin 5 MG tablet Commonly known as:  CRESTOR Take 5 mg by mouth daily at 6 PM.   tamsulosin 0.4 MG Caps capsule Commonly known as:  FLOMAX Take 1 capsule (0.4 mg total) by mouth daily.   Vitamin B1 100 MG Tabs Take 250 mg by mouth every morning.       Allergies:  Allergies  Allergen Reactions  . B Complex Formula 1 Hives    Family History: Family History  Problem Relation Age of Onset  . Ovarian cancer Sister   . Bladder Cancer Neg Hx   . Prostate cancer Neg Hx   . Kidney disease Brother     born one kidney    Social History:  reports that he has been smoking Cigarettes.  He has been smoking about 1.00 pack per day. He has never used smokeless tobacco. He reports that he drinks alcohol. He reports that he does not use drugs.  ROS: UROLOGY Frequent Urination?: No Hard to postpone urination?: No Burning/pain with urination?: No Get up at night to urinate?: Yes Leakage of urine?: No Urine stream starts and stops?: No Trouble starting stream?: Yes Do you have to strain to urinate?: No Blood in urine?: No Urinary tract infection?: No Sexually transmitted disease?: No Injury to kidneys or bladder?:  No Painful intercourse?: No Weak stream?: No Erection problems?: Yes Penile pain?: No  Gastrointestinal Nausea?: No Vomiting?: No Indigestion/heartburn?: No Diarrhea?: No Constipation?: Yes  Constitutional Fever: No Night sweats?: No Weight loss?: No Fatigue?: No  Skin Skin rash/lesions?: No Itching?: No  Eyes Blurred vision?: No Double vision?: No  Ears/Nose/Throat Sore throat?: No Sinus problems?: No  Hematologic/Lymphatic Swollen glands?: No Easy bruising?: No  Cardiovascular Leg swelling?: No Chest pain?: No  Respiratory Cough?: Yes Shortness of breath?: No  Endocrine Excessive thirst?: No  Musculoskeletal Back pain?: No Joint pain?: Yes  Neurological Headaches?: No Dizziness?: No  Psychologic Depression?: No Anxiety?: No  Physical Exam: BP (!) 151/71   Pulse 61   Ht 5\' 8"  (1.727 m)   Wt 176 lb (79.8 kg)   BMI 26.76 kg/m   Constitutional:  Alert and oriented, No acute distress. HEENT: [place] AT, moist mucus membranes.  Trachea midline, no masses. Cardiovascular: No clubbing, cyanosis, or edema. Respiratory: Normal respiratory effort, no increased work of breathing.  Skin: No rashes, bruises or suspicious lesions. Neurologic: Grossly intact, no focal deficits, moving all 4 extremities. Psychiatric: Normal mood and affect.  Laboratory Data: Lab Results  Component Value Date   WBC 8.6 07/10/2015   HGB 12.3 (L) 07/10/2015   HCT 35.3 (L) 07/10/2015   MCV 93.9 07/10/2015   PLT 192 07/10/2015    Lab Results  Component Value Date   CREATININE 0.75 07/10/2015    Results for orders placed or performed in visit on 02/01/16  BLADDER SCAN AMB NON-IMAGING  Result Value Ref Range   Scan Result 13ml     Assessment & Plan:  80 year old with BPH with lower urinary tract symptoms both obstructive and irritative in nature. He is now status post TURP improvement is obstructing symptoms but continues to have evidence of incomplete bladder  emptying with elevated postvoid residual, improved today and irritative voiding symptoms including urgency/frequency.  1. BPH with obstruction/lower urinary tract symptoms/  incomplete bladder emptying S/p TURP with improved obstructive symptoms Continues to have severe OAB symptoms Continue flomax given incomplete bladder emptying  2. OAB (overactive bladder) / urge incontinence Suspect OAB 2/2 diabetic neuropathy Taking Toviaz 4 mg qod with some good result but having dry mouth and constipation- Trial of mrybetriq 50 mg daily x 4 weeks, advised to call with result   Return in about 6 months (around 08/01/2016) for IPSS, PVR, or sooner as needed.   Hollice Espy, MD  [facility] [address], [place], [place] [address] [phone]

## 2016-02-27 ENCOUNTER — Telehealth: Payer: Self-pay | Admitting: Urology

## 2016-02-27 NOTE — Telephone Encounter (Signed)
Patient called today to let you know that the myrbetriq is not helping him as well as you had thought it would. So he just wanted you to know.  Thanks,  Sharyn Lull

## 2016-04-23 ENCOUNTER — Encounter
Admission: RE | Admit: 2016-04-23 | Discharge: 2016-04-23 | Disposition: A | Discharge status: Home / Self Care | Payer: Medicare Other | Source: Ambulatory Visit | Attending: Unknown Physician Specialty | Admitting: Unknown Physician Specialty

## 2016-04-23 DIAGNOSIS — Z01812 Encounter for preprocedural laboratory examination: Secondary | ICD-10-CM | POA: Insufficient documentation

## 2016-04-23 DIAGNOSIS — Z01810 Encounter for preprocedural cardiovascular examination: Secondary | ICD-10-CM | POA: Diagnosis present

## 2016-04-23 DIAGNOSIS — I10 Essential (primary) hypertension: Secondary | ICD-10-CM | POA: Insufficient documentation

## 2016-04-23 HISTORY — DX: Transient cerebral ischemic attack, unspecified: G45.9

## 2016-04-23 HISTORY — DX: Acute myocardial infarction, unspecified: I21.9

## 2016-04-23 LAB — BASIC METABOLIC PANEL
Anion gap: 7 (ref 5–15)
BUN: 17 mg/dL (ref 6–20)
CHLORIDE: 105 mmol/L (ref 101–111)
CO2: 28 mmol/L (ref 22–32)
Calcium: 9.6 mg/dL (ref 8.9–10.3)
Creatinine, Ser: 1 mg/dL (ref 0.61–1.24)
GFR calc Af Amer: >60 mL/min (ref >60)
GFR calc non Af Amer: >60 mL/min (ref >60)
GLUCOSE: 140 mg/dL — AB (ref 65–99)
POTASSIUM: 4.7 mmol/L (ref 3.5–5.1)
SODIUM: 140 mmol/L (ref 135–145)

## 2016-04-23 LAB — CBC
HEMATOCRIT: 38.2 % — AB (ref 40.0–52.0)
HEMOGLOBIN: 13.1 g/dL (ref 13.0–18.0)
MCH: 32.1 pg (ref 26.0–34.0)
MCHC: 34.3 g/dL (ref 32.0–36.0)
MCV: 93.5 fL (ref 80.0–100.0)
Platelets: 230 10*3/uL (ref 150–440)
RBC: 4.09 MIL/uL — AB (ref 4.40–5.90)
RDW: 14.5 % (ref 11.5–14.5)
WBC: 5.8 10*3/uL (ref 3.8–10.6)

## 2016-04-23 NOTE — Patient Instructions (Signed)
  Your procedure is scheduled on: 04/30/16 Wed Report to Same Day Surgery 2nd floor medical mall [facility] Entrance-take elevator on left to 2nd floor.  Check in with surgery information desk.) To find out your arrival time please call [phone] between 1PM - 3PM on 04/29/16 Tues  Remember: Instructions that are not followed completely may result in serious medical risk, up to and including death, or upon the discretion of your surgeon and anesthesiologist your surgery may need to be rescheduled.    _x___ 1. Do not eat food or drink liquids after midnight. No gum chewing or   hard candies.     __x__ 2. No Alcohol for 24 hours before or after surgery.   __x__3. No Smoking for 24 prior to surgery.   ____  4. Bring all medications with you on the day of surgery if instructed.    __x__ 5. Notify your doctor if there is any change in your medical condition     (cold, fever, infections).     Do not wear jewelry, make-up, hairpins, clips or nail polish.  Do not wear lotions, powders, or perfumes. You may wear deodorant.  Do not shave 48 hours prior to surgery. Men may shave face and neck.  Do not bring valuables to the hospital.    [facility] is not responsible for any belongings or valuables.               Contacts, dentures or bridgework may not be worn into surgery.  Leave your suitcase in the car. After surgery it may be brought to your room.  For patients admitted to the hospital, discharge time is determined by your                       treatment team.   Patients discharged the day of surgery will not be allowed to drive home.  You will need someone to drive you home and stay with you the night of your procedure.    Please read over the following fact sheets that you were given:   [facility] Preparing for Surgery and or MRSA Information   _x___ Take anti-hypertensive (unless it includes a diuretic), cardiac, seizure, asthma,     anti-reflux and psychiatric medicines. These  include:  1. amLODipine (NORVASC  2.lisinopril (PRINIVIL,ZESTRIL  3.metoprolol succinate (TOPROL-XL  4.pantoprazole (PROTONIX)  5.  6.  ____Fleets enema or Magnesium Citrate as directed.   _x___ Use CHG Soap or sage wipes as directed on instruction sheet   ____ Use inhalers on the day of surgery and bring to hospital day of surgery  _x___ Stop Metformin and Janumet 2 days prior to surgery.    _x___ Take 1/2 of usual insulin dose the night before surgery and none on the morning     surgery.   _x___ Follow recommendations from Cardiologist, Pulmonologist or PCP regarding stopping Aspirin, Coumadin, Pllavix ,Eliquis, Effient, or Pradaxa, and Pletal.  X____Stop Anti-inflammatories such as Advil, Aleve, Ibuprofen, Motrin, Naproxen, Naprosyn, Goodies powders or aspirin products. OK to take Tylenol and                          Celebrex.   _x___ Stop supplements until after surgery.  But may continue Vitamin D, Vitamin B,       and multivitamin.   ____ Bring C-Pap to the hospital.

## 2016-04-24 NOTE — Pre-Procedure Instructions (Signed)
EKG DISCUSSED WITH DR P CARROLL AND OK TO PROCEED. NEGATIVE STRESS 5/17 AND HAD BEEN CLEARED BY DR Ubaldo Glassing 11/17. INFERIOR INFARCT NOTED 06/14/15 IN CARE EVERYWHERE

## 2016-04-30 ENCOUNTER — Ambulatory Visit: Payer: Medicare Other | Admitting: Anesthesiology

## 2016-04-30 ENCOUNTER — Encounter: Payer: Self-pay | Admitting: *Deleted

## 2016-04-30 ENCOUNTER — Ambulatory Visit
Admission: RE | Admit: 2016-04-30 | Discharge: 2016-04-30 | Disposition: A | Discharge status: Home / Self Care | Payer: Medicare Other | Source: Ambulatory Visit | Attending: Unknown Physician Specialty | Admitting: Unknown Physician Specialty

## 2016-04-30 ENCOUNTER — Encounter
Admission: RE | Disposition: A | Discharge status: Home / Self Care | Payer: Self-pay | Source: Ambulatory Visit | Attending: Unknown Physician Specialty

## 2016-04-30 DIAGNOSIS — I10 Essential (primary) hypertension: Secondary | ICD-10-CM | POA: Insufficient documentation

## 2016-04-30 DIAGNOSIS — F17200 Nicotine dependence, unspecified, uncomplicated: Secondary | ICD-10-CM | POA: Diagnosis not present

## 2016-04-30 DIAGNOSIS — Z955 Presence of coronary angioplasty implant and graft: Secondary | ICD-10-CM | POA: Diagnosis not present

## 2016-04-30 DIAGNOSIS — I2510 Atherosclerotic heart disease of native coronary artery without angina pectoris: Secondary | ICD-10-CM | POA: Insufficient documentation

## 2016-04-30 DIAGNOSIS — Z8673 Personal history of transient ischemic attack (TIA), and cerebral infarction without residual deficits: Secondary | ICD-10-CM | POA: Insufficient documentation

## 2016-04-30 DIAGNOSIS — M720 Palmar fascial fibromatosis [Dupuytren]: Secondary | ICD-10-CM | POA: Diagnosis not present

## 2016-04-30 DIAGNOSIS — E119 Type 2 diabetes mellitus without complications: Secondary | ICD-10-CM | POA: Diagnosis not present

## 2016-04-30 DIAGNOSIS — G629 Polyneuropathy, unspecified: Secondary | ICD-10-CM | POA: Diagnosis not present

## 2016-04-30 DIAGNOSIS — I219 Acute myocardial infarction, unspecified: Secondary | ICD-10-CM | POA: Insufficient documentation

## 2016-04-30 DIAGNOSIS — D649 Anemia, unspecified: Secondary | ICD-10-CM | POA: Insufficient documentation

## 2016-04-30 DIAGNOSIS — I252 Old myocardial infarction: Secondary | ICD-10-CM | POA: Diagnosis not present

## 2016-04-30 DIAGNOSIS — E785 Hyperlipidemia, unspecified: Secondary | ICD-10-CM | POA: Insufficient documentation

## 2016-04-30 DIAGNOSIS — M1990 Unspecified osteoarthritis, unspecified site: Secondary | ICD-10-CM | POA: Diagnosis not present

## 2016-04-30 DIAGNOSIS — Z85828 Personal history of other malignant neoplasm of skin: Secondary | ICD-10-CM | POA: Diagnosis not present

## 2016-04-30 DIAGNOSIS — K219 Gastro-esophageal reflux disease without esophagitis: Secondary | ICD-10-CM | POA: Diagnosis not present

## 2016-04-30 HISTORY — PX: DUPUYTREN CONTRACTURE RELEASE: SHX1478

## 2016-04-30 LAB — GLUCOSE, CAPILLARY
GLUCOSE-CAPILLARY: 122 mg/dL — AB (ref 65–99)
GLUCOSE-CAPILLARY: 130 mg/dL — AB (ref 65–99)

## 2016-04-30 SURGERY — RELEASE, DUPUYTREN CONTRACTURE
Anesthesia: General | Laterality: Right

## 2016-04-30 MED ORDER — LIDOCAINE HCL (CARDIAC) 20 MG/ML IV SOLN
INTRAVENOUS | Status: DC | PRN
  Administered 2016-04-30: 40 mg via INTRAVENOUS

## 2016-04-30 MED ORDER — SODIUM CHLORIDE 0.9 % IV SOLN
INTRAVENOUS | Status: DC
  Administered 2016-04-30 (×2): via INTRAVENOUS

## 2016-04-30 MED ORDER — LIDOCAINE HCL (PF) 2 % IJ SOLN
INTRAMUSCULAR | Status: AC
  Filled 2016-04-30: qty 2

## 2016-04-30 MED ORDER — FENTANYL CITRATE (PF) 100 MCG/2ML IJ SOLN
INTRAMUSCULAR | Status: AC
Start: 2016-04-30 — End: 2016-04-30
  Filled 2016-04-30: qty 2

## 2016-04-30 MED ORDER — OXYCODONE HCL 5 MG PO TABS: 5.0000 mg | ORAL_TABLET | Freq: Once | ORAL | Status: DC | PRN

## 2016-04-30 MED ORDER — FENTANYL CITRATE (PF) 100 MCG/2ML IJ SOLN
INTRAMUSCULAR | Status: AC
  Administered 2016-04-30: 50 ug via INTRAVENOUS
  Filled 2016-04-30: qty 2

## 2016-04-30 MED ORDER — ONDANSETRON HCL 4 MG/2ML IJ SOLN
INTRAMUSCULAR | Status: DC | PRN
  Administered 2016-04-30: 4 mg via INTRAVENOUS

## 2016-04-30 MED ORDER — MIDAZOLAM HCL 2 MG/2ML IJ SOLN
INTRAMUSCULAR | Status: AC
  Filled 2016-04-30: qty 2

## 2016-04-30 MED ORDER — MEPERIDINE HCL 50 MG/ML IJ SOLN: 6.2500 mg | INTRAMUSCULAR | Status: DC | PRN

## 2016-04-30 MED ORDER — BUPIVACAINE HCL (PF) 0.5 % IJ SOLN
INTRAMUSCULAR | Status: AC
  Filled 2016-04-30: qty 30

## 2016-04-30 MED ORDER — NORCO 10-325 MG PO TABS: 1.0000 | ORAL_TABLET | ORAL | 0 refills | Status: DC | PRN

## 2016-04-30 MED ORDER — OXYCODONE HCL 5 MG/5ML PO SOLN: 5.0000 mg | Freq: Once | ORAL | Status: DC | PRN

## 2016-04-30 MED ORDER — PROPOFOL 10 MG/ML IV BOLUS
INTRAVENOUS | Status: AC
  Filled 2016-04-30: qty 20

## 2016-04-30 MED ORDER — ONDANSETRON HCL 4 MG/2ML IJ SOLN
INTRAMUSCULAR | Status: AC
  Filled 2016-04-30: qty 2

## 2016-04-30 MED ORDER — KETOROLAC TROMETHAMINE 30 MG/ML IJ SOLN
INTRAMUSCULAR | Status: DC | PRN
  Administered 2016-04-30: 30 mg via INTRAVENOUS

## 2016-04-30 MED ORDER — PROPOFOL 10 MG/ML IV BOLUS
INTRAVENOUS | Status: DC | PRN
  Administered 2016-04-30: 20 mg via INTRAVENOUS
  Administered 2016-04-30: 100 mg via INTRAVENOUS
  Administered 2016-04-30: 20 mg via INTRAVENOUS

## 2016-04-30 MED ORDER — GLYCOPYRROLATE 0.2 MG/ML IJ SOLN
INTRAMUSCULAR | Status: DC | PRN
  Administered 2016-04-30: .2 mg via INTRAVENOUS

## 2016-04-30 MED ORDER — FENTANYL CITRATE (PF) 100 MCG/2ML IJ SOLN
INTRAMUSCULAR | Status: DC | PRN
  Administered 2016-04-30 (×2): 50 ug via INTRAVENOUS

## 2016-04-30 MED ORDER — LIDOCAINE HCL 2 % EX GEL
CUTANEOUS | Status: AC
  Filled 2016-04-30: qty 5

## 2016-04-30 MED ORDER — PROMETHAZINE HCL 25 MG/ML IJ SOLN: 6.2500 mg | INTRAMUSCULAR | Status: DC | PRN

## 2016-04-30 MED ORDER — FENTANYL CITRATE (PF) 100 MCG/2ML IJ SOLN
25.0000 ug | INTRAMUSCULAR | Status: DC | PRN
  Administered 2016-04-30 (×2): 50 ug via INTRAVENOUS

## 2016-04-30 SURGICAL SUPPLY — 32 items
BANDAGE STRETCH 3X4.1 STRL (GAUZE/BANDAGES/DRESSINGS) ×3 IMPLANT
BLADE SURG 15 STRL LF DISP TIS (BLADE) ×2 IMPLANT
BLADE SURG 15 STRL SS (BLADE) ×4
BNDG ESMARK 4X12 TAN STRL LF (GAUZE/BANDAGES/DRESSINGS) ×3 IMPLANT
CUFF TOURN SGL QUICK 18 (TOURNIQUET CUFF) ×3 IMPLANT
CUFF TOURN SGL QUICK 24 (TOURNIQUET CUFF)
CUFF TRNQT CYL 24X4X40X1 (TOURNIQUET CUFF) IMPLANT
DURAPREP 26ML APPLICATOR (WOUND CARE) ×3 IMPLANT
ELECT REM PT RETURN 9FT ADLT (ELECTROSURGICAL) ×3
ELECTRODE REM PT RTRN 9FT ADLT (ELECTROSURGICAL) ×1 IMPLANT
GAUZE FLUFF 18X24 1PLY STRL (GAUZE/BANDAGES/DRESSINGS) ×3 IMPLANT
GAUZE PETRO XEROFOAM 1X8 (MISCELLANEOUS) ×3 IMPLANT
GAUZE SPONGE 4X4 12PLY STRL (GAUZE/BANDAGES/DRESSINGS) ×3 IMPLANT
GAUZE SPONGE NON-WVN 2X2 STRL (MISCELLANEOUS) IMPLANT
GAUZE STRETCH 2X75IN STRL (MISCELLANEOUS) ×3 IMPLANT
GLOVE BIO SURGEON STRL SZ7.5 (GLOVE) ×6 IMPLANT
GOWN STRL REUS W/ TWL LRG LVL3 (GOWN DISPOSABLE) ×2 IMPLANT
GOWN STRL REUS W/TWL LRG LVL3 (GOWN DISPOSABLE) ×4
LOOP VESSEL MINI 0.8X406 BLUE (MISCELLANEOUS) ×1 IMPLANT
LOOPS BLUE MINI 0.8X406MM (MISCELLANEOUS) ×2
NS IRRIG 1000ML POUR BTL (IV SOLUTION) ×3 IMPLANT
PACK EXTREMITY ARMC (MISCELLANEOUS) ×3 IMPLANT
PAD PREP 24X41 OB/GYN DISP (PERSONAL CARE ITEMS) ×3 IMPLANT
PADDING CAST 3IN STRL (MISCELLANEOUS) ×2
PADDING CAST BLEND 3X4 STRL (MISCELLANEOUS) ×1 IMPLANT
SPLINT CAST 1 STEP 3X12 (MISCELLANEOUS) ×3 IMPLANT
SPONGE VERSALON 2X2 STRL (MISCELLANEOUS)
STOCKINETTE BIAS CUT 3 980034 (MISCELLANEOUS) ×3 IMPLANT
STOCKINETTE STRL 4IN 9604848 (GAUZE/BANDAGES/DRESSINGS) ×3 IMPLANT
SUT ETHILON 5 0 P 3 18 (SUTURE) ×2
SUT ETHILON NAB PS2 4-0 18IN (SUTURE) ×9 IMPLANT
SUT NYLON ETHILON 5-0 P-3 1X18 (SUTURE) ×1 IMPLANT

## 2016-04-30 NOTE — H&P (Signed)
  H and P reviewed. No changes. Uploaded at later date. 

## 2016-04-30 NOTE — Anesthesia Post-op Follow-up Note (Cosign Needed)
Anesthesia QCDR form completed.        

## 2016-04-30 NOTE — Anesthesia Postprocedure Evaluation (Signed)
Anesthesia Post Note  Patient: Gregory Tran  Procedure(s) Performed: Procedure(s) (LRB): DUPUYTREN CONTRACTURE RELEASE (Right)  Patient location during evaluation: PACU Anesthesia Type: General Level of consciousness: awake and alert and oriented Pain management: pain level controlled Vital Signs Assessment: post-procedure vital signs reviewed and stable Respiratory status: spontaneous breathing, nonlabored ventilation and respiratory function stable Cardiovascular status: blood pressure returned to baseline and stable Postop Assessment: no signs of nausea or vomiting Anesthetic complications: no     Last Vitals:  Vitals:   04/30/16 1047 04/30/16 1122  BP: (!) 144/59 (!) 148/56  Pulse: (!) 53 (!) 50  Resp: 14 16  Temp: 36.4 C     Last Pain:  Vitals:   04/30/16 1122  TempSrc:   PainSc: 0-No pain                 Lamesha Tibbits

## 2016-04-30 NOTE — Anesthesia Preprocedure Evaluation (Signed)
Anesthesia Evaluation  Patient identified by MRN, date of birth, ID band Patient awake    Reviewed: Allergy & Precautions, NPO status , Patient's Chart, lab work & pertinent test results  History of Anesthesia Complications Negative for: history of anesthetic complications  Airway Mallampati: II  TM Distance: >3 FB Neck ROM: Full    Dental  (+) Missing   Pulmonary neg sleep apnea, neg COPD, Current Smoker,    breath sounds clear to auscultation- rhonchi (-) wheezing      Cardiovascular hypertension, + CAD, + Past MI and + Cardiac Stents   Rhythm:Regular Rate:Normal - Systolic murmurs and - Diastolic murmurs    Neuro/Psych negative neurological ROS  negative psych ROS   GI/Hepatic Neg liver ROS, GERD  ,  Endo/Other  diabetes  Renal/GU negative Renal ROS     Musculoskeletal  (+) Arthritis ,   Abdominal (+) - obese,   Peds  Hematology  (+) anemia ,   Anesthesia Other Findings Past Medical History: No date: Arthritis No date: BPH (benign prostatic hyperplasia) No date: Cancer (HCC)     Comment: Basal Cell No date: Chronic kidney disease No date: Coronary artery disease No date: Diabetes mellitus (HCC) No date: Ejaculatory disorder No date: Erectile dysfunction No date: Frequency No date: GERD (gastroesophageal reflux disease) No date: Gross hematuria No date: Heart disease No date: Hematuria No date: HTN (hypertension) No date: Hyperlipidemia No date: Incomplete bladder emptying No date: Microscopic hematuria No date: Myocardial infarction No date: Neuropathy (HCC) No date: Nocturia left: Rotator cuff tear No date: TIA (transient ischemic attack) 5 days ago: Tick bite of multiple sites     Comment: chest, and groin area   Reproductive/Obstetrics                             Anesthesia Physical Anesthesia Plan  ASA: III  Anesthesia Plan: General   Post-op Pain  Management:    Induction: Intravenous  Airway Management Planned: LMA  Additional Equipment:   Intra-op Plan:   Post-operative Plan:   Informed Consent: I have reviewed the patients History and Physical, chart, labs and discussed the procedure including the risks, benefits and alternatives for the proposed anesthesia with the patient or authorized representative who has indicated his/her understanding and acceptance.   Dental advisory given  Plan Discussed with: CRNA and Anesthesiologist  Anesthesia Plan Comments:         Anesthesia Quick Evaluation

## 2016-04-30 NOTE — Anesthesia Procedure Notes (Signed)
Procedure Name: LMA Insertion Date/Time: 04/30/2016 7:45 AM Performed by: Allean Found Pre-anesthesia Checklist: Patient identified, Emergency Drugs available, Suction available, Patient being monitored and Timeout performed Patient Re-evaluated:Patient Re-evaluated prior to inductionOxygen Delivery Method: Circle system utilized Preoxygenation: Pre-oxygenation with 100% oxygen Intubation Type: IV induction Ventilation: Mask ventilation without difficulty LMA: LMA inserted LMA Size: 5.0 Placement Confirmation: positive ETCO2 and breath sounds checked- equal and bilateral Tube secured with: Tape Dental Injury: Teeth and Oropharynx as per pre-operative assessment

## 2016-04-30 NOTE — Transfer of Care (Signed)
Immediate Anesthesia Transfer of Care Note  Patient: Gregory Tran  Procedure(s) Performed: Procedure(s): DUPUYTREN CONTRACTURE RELEASE (Right)  Patient Location: PACU  Anesthesia Type:General  Level of Consciousness: awake  Airway & Oxygen Therapy: Patient Spontanous Breathing and Patient connected to face mask oxygen  Post-op Assessment: Report given to RN and Post -op Vital signs reviewed and stable  Post vital signs: Reviewed and stable  Last Vitals:  Vitals:   04/30/16 0614 04/30/16 0954  BP: (!) 146/58 (!) 142/82  Pulse: (!) 52 70  Resp: 16 13  Temp: 36.5 C 36.3 C    Last Pain:  Vitals:   04/30/16 0614  TempSrc: Tympanic         Complications: No apparent anesthesia complications

## 2016-04-30 NOTE — Op Note (Signed)
04/30/2016  10:02 AM  PATIENT:  Gregory Tran  81 y.o. male  PRE-OPERATIVE DIAGNOSIS:  DUPUYTRENS CONTRACTURE  POST-OPERATIVE DIAGNOSIS:  DUPUYTRENS CONTRACTURE  PROCEDURE:  Procedure(s): DUPUYTREN CONTRACTURE RELEASE (Right) Ring finger  SURGEON:   Mariel Kansky., MD  ANESTHESIA: Gen.  IMPLANTS: None  HISTORY: Patient had a long history of progressive Dupuytren's contracture of his right ring finger. He was ultimately brought to the operating room for surgical release of the contracted right ring finger.  OP NOTE: The patient was taken to the operating room where satisfactory general anesthesia was achieved. A tourniquet was applied to the patient's right upper extremity. The right upper extremity was prepped and draped in usual fashion for a procedure about the hand. Right upper extremity was then exsanguinated and the tourniquet was inflated. I made a Z-type incision over the long axis of the right ring finger extending from the proximal palm to the DIP joint. I bluntly and sharply dissected down to the thick Dupuytren's bands running the course of the incision. I then divided the largest contracted band in the proximal palm and then dissected it distally to the PIP joint of the right ring finger. The band was divided distally. Care was taken to protect the neurovascular bundles. There were several smaller bands that were somewhat lateral to the central band. These were also resected and again care was taken to protect the neurovascular bundles. After the  Dupuytren's bands were completely excised I could passively fully extend the right ring finger. The tourniquet was released at this time. It had been up about 75 minutes. Bleeding was controlled with digital pressure. The Z incision was closed with 4-0 nylon sutures. Several sutures were used as apical stitches and the remaining sutures were placed in vertical mattress fashion. Xeroform gauze was applied over the incision. A bulky  hand compression dressing was applied. I reinforced it with a volar fiberglass splint. The patient was then awakened and transferred to his hospital bed. He was taken to the recovery room in satisfactory condition. Blood loss was negligible.

## 2016-04-30 NOTE — Discharge Instructions (Signed)
Elevation  Ice pack  RTC in about 10 days

## 2016-04-30 NOTE — Progress Notes (Signed)
Patient out of department to OR.

## 2016-05-01 LAB — SURGICAL PATHOLOGY

## 2016-06-03 ENCOUNTER — Ambulatory Visit: Payer: Medicare Other | Attending: Rheumatology | Admitting: Occupational Therapy

## 2016-06-03 DIAGNOSIS — M79641 Pain in right hand: Secondary | ICD-10-CM | POA: Diagnosis present

## 2016-06-03 DIAGNOSIS — M6281 Muscle weakness (generalized): Secondary | ICD-10-CM

## 2016-06-03 DIAGNOSIS — M25641 Stiffness of right hand, not elsewhere classified: Secondary | ICD-10-CM

## 2016-06-03 DIAGNOSIS — L905 Scar conditions and fibrosis of skin: Secondary | ICD-10-CM

## 2016-06-03 NOTE — Patient Instructions (Signed)
Heat  Scar massage - but stay away from scabs Use cica scar pad on middle part at night time Dorsal hand base splint - for 4th and 5th digit extention - for night time   PROM for DIP flexion and blocked AROM 4th DIP  PROM for PIP extention 4th  Tendon glides  Opposition  AROM 3 x day

## 2016-06-03 NOTE — Therapy (Signed)
[place] [facility] [address], [place], [address] Phone: [phone]   Fax:  [phone]  Occupational Therapy Evaluation  Patient Details  Name: Gregory Tran MRN: [id number] Date of Birth: 07/11/1935 Referring Provider: Caswell Corwin  Encounter Date: 06/03/2016      OT End of Session - 06/03/16 1017    Visit Number 1   Number of Visits 8   Date for OT Re-Evaluation 07/01/16   OT Start Time 0858   OT Stop Time 1000   OT Time Calculation (min) 62 min   Activity Tolerance Patient tolerated treatment well   Behavior During Therapy [facility] for tasks assessed/performed      Past Medical History:  Diagnosis Date  . Arthritis   . BPH (benign prostatic hyperplasia)   . Cancer (HCC)    Basal Cell  . Chronic kidney disease   . Coronary artery disease   . Diabetes mellitus ([place])   . Ejaculatory disorder   . Erectile dysfunction   . Frequency   . GERD (gastroesophageal reflux disease)   . Gross hematuria   . Heart disease   . Hematuria   . HTN (hypertension)   . Hyperlipidemia   . Incomplete bladder emptying   . Microscopic hematuria   . Myocardial infarction   . Neuropathy ([place])   . Nocturia   . Rotator cuff tear left  . TIA (transient ischemic attack)   . Tick bite of multiple sites 5 days ago   chest, and groin area    Past Surgical History:  Procedure Laterality Date  . cardiac stents    . CORONARY ANGIOPLASTY  2001, 2008   [facility]  . DUPUYTREN CONTRACTURE RELEASE Right 04/30/2016   Procedure: DUPUYTREN CONTRACTURE RELEASE;  Surgeon: Leanor Kail, MD;  Location: [facility] ORS;  Service: Orthopedics;  Laterality: Right;  . ROTATOR CUFF REPAIR Bilateral   . TRANSURETHRAL RESECTION OF PROSTATE N/A 07/09/2015   Procedure: TRANSURETHRAL RESECTION OF THE PROSTATE (TURP);  Surgeon: Hollice Espy, MD;  Location: [facility] ORS;  Service: Urology;  Laterality: N/A;    There were no vitals filed for this  visit.      Subjective Assessment - 06/03/16 1006    Subjective  Had surgery on 3/14 - My finger was bending a lot - could not get gloves on , poke myself in eye, reaching in pocket - my hand looks much better than last week - still was wearing bandaid - I am diabetic - and R handed    Patient Stated Goals Want to get back to work in yard, and use it normally - getting glove on , put hand in pocket and not poke myself in eye when washing face   Currently in Pain? Yes   Pain Score 2    Pain Location Hand   Pain Orientation Right   Pain Descriptors / Indicators Tightness   Pain Type Surgical pain   Pain Onset 1 to 4 weeks ago           [facility] OT Assessment - 06/03/16 0001      Assessment   Diagnosis R dupuytrens release    Referring Provider Caswell Corwin   Onset Date 04/30/16     Home  Environment   Lives With Alone     Prior Function   Vocation Retired   Leisure R hand dominant, likes to [employer], read, work on [employer], academic Grip (lbs) 58  Right Hand Lateral Pinch 23 lbs   Right Hand 3 Point Pinch 16 lbs   Left Hand Grip (lbs) 55   Left Hand Lateral Pinch 21 lbs   Left Hand 3 Point Pinch 16 lbs     Right Hand AROM   R Ring  MCP 0-90 80 Degrees  -10   R Ring PIP 0-100 90 Degrees  -25   R Ring DIP 0-70 --  15     Fluido therapy done - with AROM for tendon glides - to increase ROM proir to fabrication of splint  Review HEP and fabricate hand base dorsal extention splint for 4th digit - did include 5th too   Heat  Scar massage - but stay away from scabs Use cica scar pad on middle part at night time Dorsal hand base splint - for 4th and 5th digit extention - for night time   PROM for DIP flexion and blocked AROM 4th DIP  PROM for PIP extention 4th  Tendon glides  Opposition  AROM 3 x da                   OT Education - 06/03/16 1017    Education provided Yes   Education Details Findings and HEP    Person(s) Educated  Patient   Methods Explanation;Demonstration;Tactile cues;Verbal cues;Handout   Comprehension Verbal cues required;Returned demonstration;Verbalized understanding          OT Short Term Goals - 06/03/16 1022      OT SHORT TERM GOAL #1   Title Pt to be independent in HEP for scar mobs, splint wearing and ROM to increase use of R hand without increase discomfort   Baseline very little knowledge    Time 3   Period Weeks   Status New     OT SHORT TERM GOAL #2   Title Extention of 4th digit improve with more than 10 degrees to be able to put hand in pocket and gloves with ease   Baseline 4th Extention at [facility] -10 and PIP -25   Time 3   Period Weeks   Status New           OT Long Term Goals - 06/03/16 1024      OT LONG TERM GOAL #1   Title Flexion of R hand 4th digit WFL to return holding tools in yardwork without any increase symptoms   Baseline Flexion PIP 90 and MC 80, DIP 15   Time 4   Period Weeks   Status New     OT LONG TERM GOAL #2   Title Pain and Function  in R hand on PRWHE outcome measure improve with more than 4 points   Baseline PRWHE at eval pain 7/50 and function 6/10   Time 4   Period Weeks   Status New               Plan - 06/03/16 1018    Clinical Impression Statement Pt present 4 1/1 wks s/p dupuytrens release in palm and 4th digit - pt show increase scar tissue - pt is diabetic - with some scabs still present - increase pain and tenderness -  decrease extention of 4th PIP more than MC- and decrease flexion in 4th -  limiting his functional use of R dominant hand - pt can benefit from splint for night time use, scar mobs , ROM and strengthening    Rehab Potential Good   OT Frequency 2x / week  OT Duration 4 weeks   OT Treatment/Interventions Self-care/ADL training;Moist Heat;Fluidtherapy;Splinting;Patient/family education;Therapeutic exercises;Ultrasound;Scar mobilization;Passive range of motion;Manual Therapy;Parrafin   Plan assess progress  with HEP , splint wearing    OT Home Exercise Plan see pt instruction    Consulted and Agree with Plan of Care Patient      Patient will benefit from skilled therapeutic intervention in order to improve the following deficits and impairments:  Decreased coordination, Decreased range of motion, Impaired flexibility, Pain, Impaired UE functional use, Decreased scar mobility, Decreased strength  Visit Diagnosis: Stiffness of right hand, not elsewhere classified - Plan: Ot plan of care cert/re-cert  Scar tissue - Plan: Ot plan of care cert/re-cert  Pain in right hand - Plan: Ot plan of care cert/re-cert  Muscle weakness (generalized) - Plan: Ot plan of care cert/re-cert    Problem List Patient Active Problem List   Diagnosis Date Noted  . BPH (benign prostatic hypertrophy) with urinary obstruction 07/09/2015  . Benign essential hypertension 06/14/2015  . Benign fibroma of prostate 05/02/2015  . Arteriosclerosis of coronary artery 05/02/2015  . Chronic LBP 05/02/2015  . Type 2 diabetes mellitus ([place]) 05/02/2015  . Gastro-esophageal reflux disease without esophagitis 05/02/2015  . HLD (hyperlipidemia) 05/02/2015  . BP (high blood pressure) 05/02/2015  . Anemia, iron deficiency 05/02/2015  . Contracture of palmar fascia (Dupuytren's) 04/10/2015  . Impingement syndrome of left shoulder 03/26/2015  . Pain in shoulder 01/23/2015  . BPH with obstruction/lower urinary tract symptoms 10/16/2014  . Ejaculatory disorder 10/16/2014  . Low back pain with sciatica 04/10/2014    Rosalyn Gess OTR/L,CLT 06/03/2016, 10:28 AM  [place] [facility] [address] [place], [address] Phone: [phone]   Fax:  [phone]  Name: Gregory Tran MRN: [id number] Date of Birth: [birth year]

## 2016-06-06 ENCOUNTER — Ambulatory Visit: Payer: Medicare Other | Admitting: Occupational Therapy

## 2016-06-06 DIAGNOSIS — M25641 Stiffness of right hand, not elsewhere classified: Secondary | ICD-10-CM | POA: Diagnosis not present

## 2016-06-06 DIAGNOSIS — L905 Scar conditions and fibrosis of skin: Secondary | ICD-10-CM

## 2016-06-06 DIAGNOSIS — M79641 Pain in right hand: Secondary | ICD-10-CM

## 2016-06-06 DIAGNOSIS — M6281 Muscle weakness (generalized): Secondary | ICD-10-CM

## 2016-06-06 NOTE — Patient Instructions (Addendum)
Pt to cont with  hand base dorsal extention splint for 4th digit - did include 5th too  Cont to use  cica scar pad on palmar scar now  at night time- cut larger piece  Silicon digi sleeve this date for 4th digit - during day on and off  Same HEP for ROM

## 2016-06-06 NOTE — Therapy (Signed)
[place] [facility] [address], [place], [address] Phone: [phone]   Fax:  [phone]  Occupational Therapy Treatment  Patient Details  Name: Gregory Tran MRN: [id number] Date of Birth: 08-Mar-1935 Referring Provider: Caswell Corwin  Encounter Date: 06/06/2016      OT End of Session - 06/06/16 0906    Visit Number 2   Number of Visits 8   Date for OT Re-Evaluation 07/01/16   OT Start Time 0854   OT Stop Time 0929   OT Time Calculation (min) 35 min   Activity Tolerance Patient tolerated treatment well   Behavior During Therapy [facility] for tasks assessed/performed      Past Medical History:  Diagnosis Date  . Arthritis   . BPH (benign prostatic hyperplasia)   . Cancer (HCC)    Basal Cell  . Chronic kidney disease   . Coronary artery disease   . Diabetes mellitus ([place])   . Ejaculatory disorder   . Erectile dysfunction   . Frequency   . GERD (gastroesophageal reflux disease)   . Gross hematuria   . Heart disease   . Hematuria   . HTN (hypertension)   . Hyperlipidemia   . Incomplete bladder emptying   . Microscopic hematuria   . Myocardial infarction   . Neuropathy ([place])   . Nocturia   . Rotator cuff tear left  . TIA (transient ischemic attack)   . Tick bite of multiple sites 5 days ago   chest, and groin area    Past Surgical History:  Procedure Laterality Date  . cardiac stents    . CORONARY ANGIOPLASTY  2001, 2008   [facility]  . DUPUYTREN CONTRACTURE RELEASE Right 04/30/2016   Procedure: DUPUYTREN CONTRACTURE RELEASE;  Surgeon: Leanor Kail, MD;  Location: [facility] ORS;  Service: Orthopedics;  Laterality: Right;  . ROTATOR CUFF REPAIR Bilateral   . TRANSURETHRAL RESECTION OF PROSTATE N/A 07/09/2015   Procedure: TRANSURETHRAL RESECTION OF THE PROSTATE (TURP);  Surgeon: Hollice Espy, MD;  Location: [facility] ORS;  Service: Urology;  Laterality: N/A;    There were no vitals filed for this visit.      Subjective Assessment - 06/06/16 0902    Subjective  I lost my scar pad the other night, had Vit E oil on  - splint doing okay - scar improving- no pain    Patient Stated Goals Want to get back to work in yard, and use it normally - getting glove on , put hand in pocket and not poke myself in eye when washing face   Currently in Pain? No/denies            [facility] OT Assessment - 06/06/16 0001      Right Hand AROM   R Ring  MCP 0-90 85 Degrees  -5   R Ring PIP 0-100 100 Degrees  -15   R Ring DIP 0-70 50 Degrees                  OT Treatments/Exercises (OP) - 06/06/16 0001      RUE Paraffin   Number Minutes Paraffin 10 Minutes   RUE Paraffin Location Hand   Comments at [facility] to decrease scar tissue - and increase ROM       Assess AROM for 4th digit flexion and extention - se flowsheet Paraffin done - see flowsheet   Scar massage done using vibration and scar massage by OT - stayed off 2 areas that is  still tender and one that had little scab Pt to cont with  hand base dorsal extention splint for 4th digit - did include 5th too  Cont to use  cica scar pad on palmar scar now  at night time- cut larger piece   PROM for DIP flexion and blocked AROM 4th DIP  PROM for PIP extention 4th on table   Tendon glides - done AAROM and AROM  Composite fist - gentle PROM              OT Education - 06/06/16 0906    Education provided Yes   Education Details scar massage and scar pads - cont same ROM    Person(s) Educated Patient   Methods Explanation;Demonstration;Tactile cues;Verbal cues   Comprehension Verbal cues required;Returned demonstration;Verbalized understanding          OT Short Term Goals - 06/03/16 1022      OT SHORT TERM GOAL #1   Title Pt to be independent in HEP for scar mobs, splint wearing and ROM to increase use of R hand without increase discomfort   Baseline very little knowledge    Time 3   Period Weeks   Status New     OT SHORT  TERM GOAL #2   Title Extention of 4th digit improve with more than 10 degrees to be able to put hand in pocket and gloves with ease   Baseline 4th Extention at [facility] -10 and PIP -25   Time 3   Period Weeks   Status New           OT Long Term Goals - 06/03/16 1024      OT LONG TERM GOAL #1   Title Flexion of R hand 4th digit WFL to return holding tools in yardwork without any increase symptoms   Baseline Flexion PIP 90 and MC 80, DIP 15   Time 4   Period Weeks   Status New     OT LONG TERM GOAL #2   Title Pain and Function  in R hand on PRWHE outcome measure improve with more than 4 points   Baseline PRWHE at eval pain 7/50 and function 6/10   Time 4   Period Weeks   Status New               Plan - 06/06/16 0981    Clinical Impression Statement Pt show increase AROM in 4th digit flexion and extention since last time - but scar tissue still thick - scabs only one present - pt provided with larger scar pad for palm and digi sleeve for daytime use -  but take off several times a day -    Rehab Potential Good   OT Treatment/Interventions Self-care/ADL training;Moist Heat;Fluidtherapy;Splinting;Patient/family education;Therapeutic exercises;Ultrasound;Scar mobilization;Passive range of motion;Manual Therapy;Parrafin   Plan assess splint fit and ROM , scar healing   OT Home Exercise Plan see pt instruction    Consulted and Agree with Plan of Care Patient      Patient will benefit from skilled therapeutic intervention in order to improve the following deficits and impairments:  Decreased coordination, Decreased range of motion, Impaired flexibility, Pain, Impaired UE functional use, Decreased scar mobility, Decreased strength  Visit Diagnosis: Stiffness of right hand, not elsewhere classified  Scar tissue  Pain in right hand  Muscle weakness (generalized)    Problem List Patient Active Problem List   Diagnosis Date Noted  . BPH (benign prostatic hypertrophy) with  urinary obstruction 07/09/2015  . Benign  essential hypertension 06/14/2015  . Benign fibroma of prostate 05/02/2015  . Arteriosclerosis of coronary artery 05/02/2015  . Chronic LBP 05/02/2015  . Type 2 diabetes mellitus ([place]) 05/02/2015  . Gastro-esophageal reflux disease without esophagitis 05/02/2015  . HLD (hyperlipidemia) 05/02/2015  . BP (high blood pressure) 05/02/2015  . Anemia, iron deficiency 05/02/2015  . Contracture of palmar fascia (Dupuytren's) 04/10/2015  . Impingement syndrome of left shoulder 03/26/2015  . Pain in shoulder 01/23/2015  . BPH with obstruction/lower urinary tract symptoms 10/16/2014  . Ejaculatory disorder 10/16/2014  . Low back pain with sciatica 04/10/2014    Rosalyn Gess OTR/L,CLT 06/06/2016, 9:40 AM  [place] [facility] [address] [place], [address] Phone: [phone]   Fax:  [phone]  Name: MERRIT FRIESEN MRN: [id number] Date of Birth: 1935/05/17

## 2016-06-10 ENCOUNTER — Ambulatory Visit: Payer: Medicare Other | Admitting: Occupational Therapy

## 2016-06-10 DIAGNOSIS — L905 Scar conditions and fibrosis of skin: Secondary | ICD-10-CM

## 2016-06-10 DIAGNOSIS — M25641 Stiffness of right hand, not elsewhere classified: Secondary | ICD-10-CM | POA: Diagnosis not present

## 2016-06-10 DIAGNOSIS — M79641 Pain in right hand: Secondary | ICD-10-CM

## 2016-06-10 DIAGNOSIS — M6281 Muscle weakness (generalized): Secondary | ICD-10-CM

## 2016-06-10 NOTE — Patient Instructions (Addendum)
  Pt ed on using it at home - 3-5 min at time prior to extention PROM   Scar massage Pt to cont with  hand base dorsal extention splint for 4th digit only -  Cont to use  cica scar pad on palmar scar at night tie  And silicon sleeve on 4th digit during day on and off   PROM for DIP flexion and blocked AROM 4th DIP  PROM for PIP extention 4th on table   Tendon glides

## 2016-06-10 NOTE — Therapy (Signed)
[place] [facility] [address] [place], [address] Phone: [phone]   Fax:  [phone]  Occupational Therapy Treatment  Patient Details  Name: Gregory Tran MRN: [id number] Date of Birth: Aug 22, 1935 Referring Provider: Caswell Corwin  Encounter Date: 06/10/2016      OT End of Session - 06/10/16 0902    Visit Number 3   Number of Visits 8   Date for OT Re-Evaluation 07/01/16   OT Start Time 0848   OT Stop Time 0940   OT Time Calculation (min) 52 min   Activity Tolerance Patient tolerated treatment well   Behavior During Therapy [facility] for tasks assessed/performed      Past Medical History:  Diagnosis Date  . Arthritis   . BPH (benign prostatic hyperplasia)   . Cancer (HCC)    Basal Cell  . Chronic kidney disease   . Coronary artery disease   . Diabetes mellitus ([place])   . Ejaculatory disorder   . Erectile dysfunction   . Frequency   . GERD (gastroesophageal reflux disease)   . Gross hematuria   . Heart disease   . Hematuria   . HTN (hypertension)   . Hyperlipidemia   . Incomplete bladder emptying   . Microscopic hematuria   . Myocardial infarction   . Neuropathy ([place])   . Nocturia   . Rotator cuff tear left  . TIA (transient ischemic attack)   . Tick bite of multiple sites 5 days ago   chest, and groin area    Past Surgical History:  Procedure Laterality Date  . cardiac stents    . CORONARY ANGIOPLASTY  2001, 2008   [facility]  . DUPUYTREN CONTRACTURE RELEASE Right 04/30/2016   Procedure: DUPUYTREN CONTRACTURE RELEASE;  Surgeon: Leanor Kail, MD;  Location: [facility] ORS;  Service: Orthopedics;  Laterality: Right;  . ROTATOR CUFF REPAIR Bilateral   . TRANSURETHRAL RESECTION OF PROSTATE N/A 07/09/2015   Procedure: TRANSURETHRAL RESECTION OF THE PROSTATE (TURP);  Surgeon: Hollice Espy, MD;  Location: [facility] ORS;  Service: Urology;  Laterality: N/A;    There were no vitals filed for this visit.      Subjective Assessment - 06/10/16 0850    Subjective  Hand not as tender, scar looking better , using scar pad - but forgot splint and scarpad last night -doing better with motion   Patient Stated Goals Want to get back to work in yard, and use it normally - getting glove on , put hand in pocket and not poke myself in eye when washing face   Currently in Pain? No/denies            [facility] OT Assessment - 06/10/16 0001      Strength   Right Hand Grip (lbs) 75   Right Hand Lateral Pinch 21 lbs   Right Hand 3 Point Pinch 16 lbs   Left Hand Grip (lbs) 55     Right Hand AROM   R Ring  MCP 0-90 85 Degrees   R Ring PIP 0-100 100 Degrees   R Ring DIP 0-70 55 Degrees                  OT Treatments/Exercises (OP) - 06/10/16 0001      Ultrasound   Ultrasound Location palmar scar   Ultrasound Parameters 3.3MHZ , 20%, 1.0 intensity for 5 min at end of session    Ultrasound Goals Pain;Edema     RUE Paraffin   Number  Minutes Paraffin 10 Minutes   RUE Paraffin Location Hand   Comments At [facility] to decrease scar tissue       Assess AROM for 4th digit flexion and extention - se flowsheet Paraffin done with LMB splint on for PIP extention of 4th  - see flowsheet Pt ed on using it at home - 3-5 min at time prior to extention PROM   Scar massage done using Graston tool nr 2 around scar and distal and proximal to scab on volar 4th  vibration and scar massage by OT done over whole scar  Pt to cont with  hand base dorsal extention splint for 4th digit only -  Cont to use  cica scar pad on palmar scar at night tie  And silicon sleeve on 4th digit during day on and off   PROM for DIP flexion and blocked AROM 4th DIP  PROM for PIP extention 4th on table   Tendon glides              OT Education - 06/10/16 0901    Education provided Yes   Person(s) Educated Patient   Methods Explanation;Demonstration;Tactile cues   Comprehension Returned demonstration;Verbalized  understanding          OT Short Term Goals - 06/03/16 1022      OT SHORT TERM GOAL #1   Title Pt to be independent in HEP for scar mobs, splint wearing and ROM to increase use of R hand without increase discomfort   Baseline very little knowledge    Time 3   Period Weeks   Status New     OT SHORT TERM GOAL #2   Title Extention of 4th digit improve with more than 10 degrees to be able to put hand in pocket and gloves with ease   Baseline 4th Extention at [facility] -10 and PIP -25   Time 3   Period Weeks   Status New           OT Long Term Goals - 06/03/16 1024      OT LONG TERM GOAL #1   Title Flexion of R hand 4th digit WFL to return holding tools in yardwork without any increase symptoms   Baseline Flexion PIP 90 and MC 80, DIP 15   Time 4   Period Weeks   Status New     OT LONG TERM GOAL #2   Title Pain and Function  in R hand on PRWHE outcome measure improve with more than 4 points   Baseline PRWHE at eval pain 7/50 and function 6/10   Time 4   Period Weeks   Status New               Plan - 06/10/16 2683    Clinical Impression Statement Pt's scar improve greatly - still thick and hypertrophic in palm - and one scab on volar 4th - tenderness less - pt still limited in extention of 4th PIP and MP - add LMB extention splint for use during day in HEP    Rehab Potential Good   OT Frequency 2x / week   OT Duration 4 weeks   OT Treatment/Interventions Self-care/ADL training;Moist Heat;Fluidtherapy;Splinting;Patient/family education;Therapeutic exercises;Ultrasound;Scar mobilization;Passive range of motion;Manual Therapy;Parrafin   Plan cont scar pads and splint - HEP    OT Home Exercise Plan see pt instruction    Consulted and Agree with Plan of Care Patient      Patient will benefit from skilled therapeutic intervention in order to  improve the following deficits and impairments:  Decreased coordination, Decreased range of motion, Impaired flexibility, Pain,  Impaired UE functional use, Decreased scar mobility, Decreased strength  Visit Diagnosis: Stiffness of right hand, not elsewhere classified  Scar tissue  Pain in right hand  Muscle weakness (generalized)    Problem List Patient Active Problem List   Diagnosis Date Noted  . BPH (benign prostatic hypertrophy) with urinary obstruction 07/09/2015  . Benign essential hypertension 06/14/2015  . Benign fibroma of prostate 05/02/2015  . Arteriosclerosis of coronary artery 05/02/2015  . Chronic LBP 05/02/2015  . Type 2 diabetes mellitus ([place]) 05/02/2015  . Gastro-esophageal reflux disease without esophagitis 05/02/2015  . HLD (hyperlipidemia) 05/02/2015  . BP (high blood pressure) 05/02/2015  . Anemia, iron deficiency 05/02/2015  . Contracture of palmar fascia (Dupuytren's) 04/10/2015  . Impingement syndrome of left shoulder 03/26/2015  . Pain in shoulder 01/23/2015  . BPH with obstruction/lower urinary tract symptoms 10/16/2014  . Ejaculatory disorder 10/16/2014  . Low back pain with sciatica 04/10/2014    Rosalyn Gess OTR/L,CLT 06/10/2016, 12:40 PM  [place] [facility] [address] [place], [address] Phone: [phone]   Fax:  [phone]  Name: JOHAN CREVELING MRN: [id number] Date of Birth: 1935-12-15

## 2016-06-12 ENCOUNTER — Encounter: Payer: Self-pay | Admitting: Occupational Therapy

## 2016-06-12 ENCOUNTER — Ambulatory Visit: Payer: Medicare Other | Admitting: Occupational Therapy

## 2016-06-12 DIAGNOSIS — M25641 Stiffness of right hand, not elsewhere classified: Secondary | ICD-10-CM | POA: Diagnosis not present

## 2016-06-12 DIAGNOSIS — M79641 Pain in right hand: Secondary | ICD-10-CM

## 2016-06-12 DIAGNOSIS — M6281 Muscle weakness (generalized): Secondary | ICD-10-CM

## 2016-06-12 DIAGNOSIS — L905 Scar conditions and fibrosis of skin: Secondary | ICD-10-CM

## 2016-06-12 NOTE — Therapy (Signed)
[facility] [address], [place], [address] Phone: [phone]   Fax:  [phone]  Occupational Therapy Treatment  Patient Details  Name: Gregory Tran MRN: [id number] Date of Birth: [birth year] Referring Provider: Caswell Corwin  Encounter Date: 06/12/2016      OT End of Session - 06/12/16 0917    Visit Number 4   Number of Visits 8   Date for OT Re-Evaluation 07/01/16   OT Start Time 0825   OT Stop Time 0913   OT Time Calculation (min) 48 min   Activity Tolerance Patient tolerated treatment well   Behavior During Therapy [facility] for tasks assessed/performed      Past Medical History:  Diagnosis Date  . Arthritis   . BPH (benign prostatic hyperplasia)   . Cancer (HCC)    Basal Cell  . Chronic kidney disease   . Coronary artery disease   . Diabetes mellitus ([place])   . Ejaculatory disorder   . Erectile dysfunction   . Frequency   . GERD (gastroesophageal reflux disease)   . Gross hematuria   . Heart disease   . Hematuria   . HTN (hypertension)   . Hyperlipidemia   . Incomplete bladder emptying   . Microscopic hematuria   . Myocardial infarction ([place])   . Neuropathy   . Nocturia   . Rotator cuff tear left  . TIA (transient ischemic attack)   . Tick bite of multiple sites 5 days ago   chest, and groin area    Past Surgical History:  Procedure Laterality Date  . cardiac stents    . CORONARY ANGIOPLASTY  2001, 2008   [facility]  . DUPUYTREN CONTRACTURE RELEASE Right 04/30/2016   Procedure: DUPUYTREN CONTRACTURE RELEASE;  Surgeon: Leanor Kail, MD;  Location: [facility] ORS;  Service: Orthopedics;  Laterality: Right;  . ROTATOR CUFF REPAIR Bilateral   . TRANSURETHRAL RESECTION OF PROSTATE N/A 07/09/2015   Procedure: TRANSURETHRAL RESECTION OF THE PROSTATE (TURP);  Surgeon: Hollice Espy, MD;  Location: [facility] ORS;  Service: Urology;  Laterality: N/A;    There were no vitals filed for this visit.      Subjective Assessment - 06/12/16 0835    Subjective  Hand is moving better, "looking and feeling better" Pt reports that he "left that splint at home, I forgot it" Re: LMB for RF extension right    Patient Stated Goals Want to get back to work in yard, and use it normally - getting glove on , put hand in pocket and not poke myself in eye when washing face   Currently in Pain? No/denies   Pain Score 0-No pain                      OT Treatments/Exercises (OP) - 06/12/16 0001      Exercises   Exercises Hand     Hand Exercises   Joint Blocking Exercises Added Blocked flexion ex's to DIP, PIP of RF right in conjunction with tendon gliding and PROM for PIP extension of RF on tabletop.    Tendon Glides * See above; Right hand/RF   Other Hand Exercises Cont use of scar pad on palmar scar at [place] time; silicon 4th digit right RF off and on durig day.    Other Hand Exercises Vibration and scar massage by OTR/L over right volar scar x10 min to assist with increased flexibility and ROM, scar management.     Ultrasound  Ultrasound Location palmar scar   Ultrasound Parameters 3.3MHZ , 20%, 1.0 intensity for 5 min at end of session    Ultrasound Goals Pain;Edema     RUE Paraffin   Number Minutes Paraffin 10 Minutes   RUE Paraffin Location Hand   Comments At [facility] in extension with weight of heat pack to assist with increased extension of PIP, to decrease scar tissue      Splinting   Splinting Verbally reviewed use of LMB splint during the day and NOC extension splint at [facility] with scar pad.         PIP Flexion (Active Blocked)    Hold large knuckle straight using other hand. Bend middle joint of __right ring___ finger as far as possible. Hold 3-5 seconds. Repeat __5-10__ times. Do __3-4__ sessions per day. Activity: Curl fingers around a jar cap.* IP Flexion (Active Blocked)   CopyrigDIP Flexion (Active Blocked)    Hold _right ring_ finger firmly at the middle so  that only the tip joint can bend. Hold _3-5__ seconds. Repeat _5-10__ times. Do _3-4_ sessions per day.  Copyright  VHI. All rights reserved.         OT Education - 06/12/16 [phone]    Education provided Yes  HEP review and added blcoked flexion for R RF PIP, DIP. Review of splint use (LMB during day and NOC extension. Scar management   Education Details HEP review and added blcoked flexion for R RF PIP, DIP. Review of splint use (LMB during day and NOC extension. Scar managemen   Person(s) Educated Patient   Methods Explanation;Demonstration;Verbal cues   Comprehension Verbalized understanding;Returned demonstration          OT Short Term Goals - 06/03/16 1022      OT SHORT TERM GOAL #1   Title Pt to be independent in HEP for scar mobs, splint wearing and ROM to increase use of R hand without increase discomfort   Baseline very little knowledge    Time 3   Period Weeks   Status New     OT SHORT TERM GOAL #2   Title Extention of 4th digit improve with more than 10 degrees to be able to put hand in pocket and gloves with ease   Baseline 4th Extention at [facility] -10 and PIP -25   Time 3   Period Weeks   Status New           OT Long Term Goals - 06/03/16 1024      OT LONG TERM GOAL #1   Title Flexion of R hand 4th digit WFL to return holding tools in yardwork without any increase symptoms   Baseline Flexion PIP 90 and MC 80, DIP 15   Time 4   Period Weeks   Status New     OT LONG TERM GOAL #2   Title Pain and Function  in R hand on PRWHE outcome measure improve with more than 4 points   Baseline PRWHE at eval pain 7/50 and function 6/10   Time 4   Period Weeks   Status New               Plan - 06/12/16 7591    Clinical Impression Statement Pt reports increased flexibility and ROM overall as scar cont to heal. Tightness and scar thick and hypertrophic in volar palm. Denies pain. Cont with HEP and splinting, scar management should all assist with terminal  flexion and extension of right RF.   Rehab Potential Good  OT Frequency 2x / week   OT Duration 4 weeks   OT Treatment/Interventions Self-care/ADL training;Moist Heat;Fluidtherapy;Splinting;Patient/family education;Therapeutic exercises;Ultrasound;Scar mobilization;Passive range of motion;Manual Therapy;Parrafin   Plan Upgrade HEP as able; scar management and splint check. Check STG's   OT Home Exercise Plan see pt instruction    Consulted and Agree with Plan of Care Patient      Patient will benefit from skilled therapeutic intervention in order to improve the following deficits and impairments:  Decreased coordination, Decreased range of motion, Impaired flexibility, Pain, Impaired UE functional use, Decreased scar mobility, Decreased strength  Visit Diagnosis: Stiffness of right hand, not elsewhere classified  Scar tissue  Muscle weakness (generalized)  Pain in right hand    Problem List Patient Active Problem List   Diagnosis Date Noted  . BPH (benign prostatic hypertrophy) with urinary obstruction 07/09/2015  . Benign essential hypertension 06/14/2015  . Benign fibroma of prostate 05/02/2015  . Arteriosclerosis of coronary artery 05/02/2015  . Chronic LBP 05/02/2015  . Type 2 diabetes mellitus ([place]) 05/02/2015  . Gastro-esophageal reflux disease without esophagitis 05/02/2015  . HLD (hyperlipidemia) 05/02/2015  . BP (high blood pressure) 05/02/2015  . Anemia, iron deficiency 05/02/2015  . Contracture of palmar fascia (Dupuytren's) 04/10/2015  . Impingement syndrome of left shoulder 03/26/2015  . Pain in shoulder 01/23/2015  . BPH with obstruction/lower urinary tract symptoms 10/16/2014  . Ejaculatory disorder 10/16/2014  . Low back pain with sciatica 04/10/2014    Percell Miller Ardath Sax, OTR/L 06/12/2016, 9:22 AM  [facility] [address] [place], [address] Phone: [phone]   Fax:   [phone]  Name: Gregory Tran MRN: [id number] Date of Birth: 02-24-1935

## 2016-06-12 NOTE — Patient Instructions (Signed)
PIP Flexion (Active Blocked)    Hold large knuckle straight using other hand. Bend middle joint of __right ring___ finger as far as possible. Hold 3-5 seconds. Repeat __5-10__ times. Do __3-4__ sessions per day. Activity: Curl fingers around a jar cap.* IP Flexion (Active Blocked)   CopyrigDIP Flexion (Active Blocked)    Hold _right ring_ finger firmly at the middle so that only the tip joint can bend. Hold _3-5__ seconds. Repeat _5-10__ times. Do _3-4_ sessions per day.  Copyright  VHI. All rights reserved.

## 2016-06-18 ENCOUNTER — Ambulatory Visit: Payer: Medicare Other | Attending: Unknown Physician Specialty | Admitting: Occupational Therapy

## 2016-06-18 DIAGNOSIS — M79641 Pain in right hand: Secondary | ICD-10-CM

## 2016-06-18 DIAGNOSIS — M6281 Muscle weakness (generalized): Secondary | ICD-10-CM | POA: Insufficient documentation

## 2016-06-18 DIAGNOSIS — M25641 Stiffness of right hand, not elsewhere classified: Secondary | ICD-10-CM | POA: Diagnosis present

## 2016-06-18 DIAGNOSIS — L905 Scar conditions and fibrosis of skin: Secondary | ICD-10-CM | POA: Insufficient documentation

## 2016-06-18 NOTE — Patient Instructions (Signed)
Cont with same HEP provided by this OT - and focus on 3 areas of scar - still thick

## 2016-06-18 NOTE — Therapy (Signed)
[place] [facility] [address], [place], [address] Phone: [phone]   Fax:  [phone]  Occupational Therapy Treatment  Patient Details  Name: Gregory Tran MRN: [id number] Date of Birth: [birth year] Referring Provider: Caswell Corwin  Encounter Date: 06/18/2016      OT End of Session - 06/18/16 0940    Visit Number 5   Number of Visits 8   Date for OT Re-Evaluation 07/01/16   OT Start Time 0842   OT Stop Time 0935   OT Time Calculation (min) 53 min   Activity Tolerance Patient tolerated treatment well   Behavior During Therapy [facility] for tasks assessed/performed      Past Medical History:  Diagnosis Date  . Arthritis   . BPH (benign prostatic hyperplasia)   . Cancer (HCC)    Basal Cell  . Chronic kidney disease   . Coronary artery disease   . Diabetes mellitus ([place])   . Ejaculatory disorder   . Erectile dysfunction   . Frequency   . GERD (gastroesophageal reflux disease)   . Gross hematuria   . Heart disease   . Hematuria   . HTN (hypertension)   . Hyperlipidemia   . Incomplete bladder emptying   . Microscopic hematuria   . Myocardial infarction ([place])   . Neuropathy   . Nocturia   . Rotator cuff tear left  . TIA (transient ischemic attack)   . Tick bite of multiple sites 5 days ago   chest, and groin area    Past Surgical History:  Procedure Laterality Date  . cardiac stents    . CORONARY ANGIOPLASTY  2001, 2008   [facility]  . DUPUYTREN CONTRACTURE RELEASE Right 04/30/2016   Procedure: DUPUYTREN CONTRACTURE RELEASE;  Surgeon: Leanor Kail, MD;  Location: [facility] ORS;  Service: Orthopedics;  Laterality: Right;  . ROTATOR CUFF REPAIR Bilateral   . TRANSURETHRAL RESECTION OF PROSTATE N/A 07/09/2015   Procedure: TRANSURETHRAL RESECTION OF THE PROSTATE (TURP);  Surgeon: Hollice Espy, MD;  Location: [facility] ORS;  Service: Urology;  Laterality: N/A;    There were no vitals filed for this visit.       Subjective Assessment - 06/18/16 0845    Subjective  Seen Dr Jefm Bryant last Friday - he released me and not going back in 6 months - scar has 2 little spots that can be tender - otherwise using it in everything - no trouble    Patient Stated Goals Want to get back to work in yard, and use it normally - getting glove on , put hand in pocket and not poke myself in eye when washing face   Currently in Pain? No/denies            [facility] OT Assessment - 06/18/16 0001      Strength   Right Hand Grip (lbs) 75   Right Hand Lateral Pinch 21 lbs   Right Hand 3 Point Pinch 16 lbs   Left Hand Grip (lbs) 55   Left Hand Lateral Pinch 16 lbs   Left Hand 3 Point Pinch 21 lbs       Assess AROM for 4th digit flexion and extention - se flowsheet and grip and prehension  Assess scar healing  Paraffin done with LMB splint on for PIP extention of 4th  - see flowsheet Pt ed on using it at home again - 3-5 min at time prior to extention PROM   Scar massage done using  Vibration over  volar scar with focus at 3 areas that is thick and still 2 with dry scab  scar massage by OT done over whole scar using manual  Pt to cont with hand base dorsal extention splint for 4th digit only -  Cont to use cica scar pad on palmar scar at night time - issues new one  And issues 2 xtra silicon sleeves on 4th digit for night time use    PROM for DIP flexion and blocked AROM 4th DIP  PROM for PIP extention 4th on table  Tendon glides - each 5 reps              OT Treatments/Exercises (OP) - 06/18/16 0001      Ultrasound   Ultrasound Location palmar scar   Ultrasound Parameters 3.3MHZ at 20% , 5 min and 1.0 intensity at end    Ultrasound Goals Edema;Pain     RUE Paraffin   Number Minutes Paraffin 10 Minutes   RUE Paraffin Location Hand   Comments at [facility] to decrease scar tissue and increase ROM                 OT Education - 06/18/16 0939    Education provided Yes   Education Details  HEP and reviewe of scar massage and use of scarpad and digi sleeve   Person(s) Educated Patient   Methods Explanation;Demonstration;Tactile cues;Verbal cues   Comprehension Verbal cues required;Returned demonstration;Verbalized understanding          OT Short Term Goals - 06/18/16 0942      OT SHORT TERM GOAL #1   Title Pt to be independent in HEP for scar mobs, splint wearing and ROM to increase use of R hand without increase discomfort   Status Achieved     OT SHORT TERM GOAL #2   Title Extention of 4th digit improve with more than 10 degrees to be able to put hand in pocket and gloves with ease   Baseline MC now 0 and PIP 15    Status Achieved           OT Long Term Goals - 06/18/16 0942      OT LONG TERM GOAL #1   Title Flexion of R hand 4th digit WFL to return holding tools in yardwork without any increase symptoms   Baseline still some tenderness over 2 areas with scabs    Time 3   Period Weeks   Status On-going     OT LONG TERM GOAL #2   Title Pain and Function  in R hand on PRWHE outcome measure improve with more than 4 points   Baseline PRWHE at eval pain 7/50 and function 6/10 and now 5/50 for pain , function .05/50   Time 3   Period Weeks   Status On-going               Plan - 06/18/16 0940    Clinical Impression Statement Pt show increase scar healing - still 3 areas of thick scar tissue and 2 dry scabs - at mid palm and volar PIP - extention of 4th digit improved as well as pain and function - pt to cont with HEP for 3 wks this time and return    Rehab Potential Good   OT Frequency Biweekly   OT Duration 4 weeks   OT Treatment/Interventions Self-care/ADL training;Moist Heat;Fluidtherapy;Splinting;Patient/family education;Therapeutic exercises;Ultrasound;Scar mobilization;Passive range of motion;Manual Therapy;Parrafin   Plan Asssess scar , extention of 4th , and if goals met  OT Home Exercise Plan see pt instruction    Consulted and Agree with  Plan of Care Patient      Patient will benefit from skilled therapeutic intervention in order to improve the following deficits and impairments:  Decreased coordination, Decreased range of motion, Impaired flexibility, Pain, Impaired UE functional use, Decreased scar mobility, Decreased strength  Visit Diagnosis: Stiffness of right hand, not elsewhere classified  Scar tissue  Muscle weakness (generalized)  Pain in right hand    Problem List Patient Active Problem List   Diagnosis Date Noted  . BPH (benign prostatic hypertrophy) with urinary obstruction 07/09/2015  . Benign essential hypertension 06/14/2015  . Benign fibroma of prostate 05/02/2015  . Arteriosclerosis of coronary artery 05/02/2015  . Chronic LBP 05/02/2015  . Type 2 diabetes mellitus ([place]) 05/02/2015  . Gastro-esophageal reflux disease without esophagitis 05/02/2015  . HLD (hyperlipidemia) 05/02/2015  . BP (high blood pressure) 05/02/2015  . Anemia, iron deficiency 05/02/2015  . Contracture of palmar fascia (Dupuytren's) 04/10/2015  . Impingement syndrome of left shoulder 03/26/2015  . Pain in shoulder 01/23/2015  . BPH with obstruction/lower urinary tract symptoms 10/16/2014  . Ejaculatory disorder 10/16/2014  . Low back pain with sciatica 04/10/2014    Rosalyn Gess OTR/L,CLT 06/18/2016, 9:44 AM  [place] [facility] [address], [place], [address] Phone: [phone]   Fax:  [phone]  Name: XYLER TERPENING MRN: [id number] Date of Birth: 02/03/1936

## 2016-07-09 ENCOUNTER — Ambulatory Visit: Payer: Medicare Other | Admitting: Occupational Therapy

## 2016-07-09 DIAGNOSIS — M25641 Stiffness of right hand, not elsewhere classified: Secondary | ICD-10-CM | POA: Diagnosis not present

## 2016-07-09 DIAGNOSIS — L905 Scar conditions and fibrosis of skin: Secondary | ICD-10-CM

## 2016-07-09 DIAGNOSIS — M79641 Pain in right hand: Secondary | ICD-10-CM

## 2016-07-09 DIAGNOSIS — M6281 Muscle weakness (generalized): Secondary | ICD-10-CM

## 2016-07-09 NOTE — Therapy (Signed)
[place] [facility] [address] [place], [address] Phone: [phone]   Fax:  [phone]  Occupational Therapy Treatment/discharge  Patient Details  Name: Gregory Tran MRN: [id number] Date of Birth: [birth year] Referring Provider: Caswell Corwin  Encounter Date: 07/09/2016      OT End of Session - 07/09/16 0935    Visit Number 6   Number of Visits 6   Date for OT Re-Evaluation 07/09/16   OT Start Time 0855   OT Stop Time 0930   OT Time Calculation (min) 35 min   Activity Tolerance Patient tolerated treatment well   Behavior During Therapy [facility] for tasks assessed/performed      Past Medical History:  Diagnosis Date  . Arthritis   . BPH (benign prostatic hyperplasia)   . Cancer (HCC)    Basal Cell  . Chronic kidney disease   . Coronary artery disease   . Diabetes mellitus ([place])   . Ejaculatory disorder   . Erectile dysfunction   . Frequency   . GERD (gastroesophageal reflux disease)   . Gross hematuria   . Heart disease   . Hematuria   . HTN (hypertension)   . Hyperlipidemia   . Incomplete bladder emptying   . Microscopic hematuria   . Myocardial infarction ([place])   . Neuropathy   . Nocturia   . Rotator cuff tear left  . TIA (transient ischemic attack)   . Tick bite of multiple sites 5 days ago   chest, and groin area    Past Surgical History:  Procedure Laterality Date  . cardiac stents    . CORONARY ANGIOPLASTY  2001, 2008   [facility]  . DUPUYTREN CONTRACTURE RELEASE Right 04/30/2016   Procedure: DUPUYTREN CONTRACTURE RELEASE;  Surgeon: Leanor Kail, MD;  Location: [facility] ORS;  Service: Orthopedics;  Laterality: Right;  . ROTATOR CUFF REPAIR Bilateral   . TRANSURETHRAL RESECTION OF PROSTATE N/A 07/09/2015   Procedure: TRANSURETHRAL RESECTION OF THE PROSTATE (TURP);  Surgeon: Hollice Espy, MD;  Location: [facility] ORS;  Service: Urology;  Laterality: N/A;    There were no vitals filed for this  visit.      Subjective Assessment - 07/09/16 0932    Subjective  I am doing okay - not doing as good with the massage and exercises- and wearing the splint no every night - I am wearing the spring splint on my L finger - it looks better - scar looks good - still feel stiff in the am    Patient Stated Goals Want to get back to work in yard, and use it normally - getting glove on , put hand in pocket and not poke myself in eye when washing face   Currently in Pain? No/denies            [facility] OT Assessment - 07/09/16 0001      Strength   Right Hand Grip (lbs) 75   Right Hand Lateral Pinch 22 lbs   Right Hand 3 Point Pinch 17 lbs   Left Hand Grip (lbs) 55   Left Hand Lateral Pinch 20 lbs   Left Hand 3 Point Pinch 17 lbs     Right Hand AROM   R Ring  MCP 0-90 90 Degrees   R Ring PIP 0-100 100 Degrees  -20   R Ring DIP 0-70 55 Degrees      assess scar and ROM /grip and prehension  Scar still thick at 2 spots in palm  and volar PIP  Issued new scar pad and digi sleeve to wear at night time  And pt also show 5 degrees loss of PIP extention - pt to wear night time splint until 3 months post op  Pt now 9 wks  Cont scar massage  ROM for extention  And issued new LMB splint to wear 3-5 min at time during day for PIP extention  And then PROM exention of PIP  Scar mobs done with vibration in palm - to decrease scar tissue and on volar PIP Pt to cont with HEP for another 3 wks and then gradually decrease - depending how PIP extention do                     OT Education - 07/09/16 0934    Education provided Yes   Education Details discharge instruction   Person(s) Educated Patient   Methods Explanation;Demonstration;Tactile cues   Comprehension Verbalized understanding;Returned demonstration;Verbal cues required          OT Short Term Goals - 07/09/16 3662      OT SHORT TERM GOAL #1   Title Pt to be independent in HEP for scar mobs, splint wearing and ROM to  increase use of R hand without increase discomfort   Status Achieved     OT SHORT TERM GOAL #2   Title Extention of 4th digit improve with more than 10 degrees to be able to put hand in pocket and gloves with ease   Baseline MC 0 and PIP -20    Status Achieved           OT Long Term Goals - 07/09/16 9476      OT LONG TERM GOAL #1   Title Flexion of R hand 4th digit WFL to return holding tools in yardwork without any increase symptoms   Status Achieved     OT LONG TERM GOAL #2   Title Pain and Function  in R hand on PRWHE outcome measure improve with more than 4 points   Status Achieved               Plan - 07/09/16 0935    Clinical Impression Statement Pt show great healing of scar - pain and functional use - grip/prehension strength great - pt did loose 5 degrees of extention at PIP to -20 - recommend for pt that he need to wear night time splint at least thru 3 months out from surgery - and cont scarmassage and scar pad at night time - pt can be discharge at this time and contact me if needed     OT Treatment/Interventions Self-care/ADL training;Moist Heat;Fluidtherapy;Splinting;Patient/family education;Therapeutic exercises;Ultrasound;Scar mobilization;Passive range of motion;Manual Therapy;Parrafin   Plan pt to cont with discharge plan - contact if needed assistance    OT Home Exercise Plan see pt instruction    Consulted and Agree with Plan of Care Patient      Patient will benefit from skilled therapeutic intervention in order to improve the following deficits and impairments:     Visit Diagnosis: Stiffness of right hand, not elsewhere classified  Scar tissue  Muscle weakness (generalized)  Pain in right hand    Problem List Patient Active Problem List   Diagnosis Date Noted  . BPH (benign prostatic hypertrophy) with urinary obstruction 07/09/2015  . Benign essential hypertension 06/14/2015  . Benign fibroma of prostate 05/02/2015  . Arteriosclerosis  of coronary artery 05/02/2015  . Chronic LBP 05/02/2015  . Type 2 diabetes  mellitus ([place]) 05/02/2015  . Gastro-esophageal reflux disease without esophagitis 05/02/2015  . HLD (hyperlipidemia) 05/02/2015  . BP (high blood pressure) 05/02/2015  . Anemia, iron deficiency 05/02/2015  . Contracture of palmar fascia (Dupuytren's) 04/10/2015  . Impingement syndrome of left shoulder 03/26/2015  . Pain in shoulder 01/23/2015  . BPH with obstruction/lower urinary tract symptoms 10/16/2014  . Ejaculatory disorder 10/16/2014  . Low back pain with sciatica 04/10/2014    Rosalyn Gess  OTR/L,CLT 07/09/2016, 9:50 AM  [place] [facility] [address], [place], [address] Phone: [phone]   Fax:  [phone]  Name: Gregory Tran MRN: [id number] Date of Birth: [birth year]

## 2016-08-01 ENCOUNTER — Encounter: Payer: Self-pay | Admitting: Urology

## 2016-08-01 ENCOUNTER — Ambulatory Visit (INDEPENDENT_AMBULATORY_CARE_PROVIDER_SITE_OTHER): Payer: Medicare Other | Admitting: Urology

## 2016-08-01 VITALS — BP 127/61 | HR 64 | Ht 68.0 in | Wt 175.0 lb

## 2016-08-01 DIAGNOSIS — N138 Other obstructive and reflux uropathy: Secondary | ICD-10-CM | POA: Diagnosis not present

## 2016-08-01 DIAGNOSIS — N401 Enlarged prostate with lower urinary tract symptoms: Secondary | ICD-10-CM | POA: Diagnosis not present

## 2016-08-01 DIAGNOSIS — N3281 Overactive bladder: Secondary | ICD-10-CM

## 2016-08-01 DIAGNOSIS — R339 Retention of urine, unspecified: Secondary | ICD-10-CM | POA: Diagnosis not present

## 2016-08-01 LAB — BLADDER SCAN AMB NON-IMAGING

## 2016-08-01 NOTE — Progress Notes (Signed)
2:22 PM  08/03/16  Myrtice Lauth October 01, 1935 [id number]  Referring provider: Sallee Lange, NP [address] [place], [place] [address]   HPI: 81 year old male with refractory BPH with LUTS s/p TURP on 07/09/15.  Preop, he had quite severe mixed urinary symptoms including obstructive and irritative voiding symptoms. This includes slow stream, incomplete bladder emptying, urinary urgency, frequency, and nocturia 2-3.  He did also have UDS which showed overactivity and evidence of BOO (BOOI 44.8).  TRUS vol preop 45 cc.    After surgery, he stopped his Flomax and finasteride.  Postop, he continued to have severe urinary urgency and frequency and has been taking Toviaz. He had difficult affording the medication and therefore only takes 4 mg every other day for cost issues and had issues constipation and dry mouth.  He tried mybetriq 50 x 4 weeks without much help.  Overall, his symptoms are essentially unchanged.  Due to persistently elevated PVR, he was resumed on flomax in addition to above.   He denies dysuria or gross hematuria.  No UTIs.  PVR 101 cc.    He is accompanied by his wife today. He reports that he is having more issues with memory and attention. He is a strong family history of Alzheimer's.       IPSS    Row Name 08/01/16 1300         International Prostate Symptom Score   How often have you had the sensation of not emptying your bladder? Less than half the time     How often have you had to urinate less than every two hours? Less than half the time     How often have you found you stopped and started again several times when you urinated? Less than half the time     How often have you found it difficult to postpone urination? Less than half the time     How often have you had a weak urinary stream? More than half the time     How often have you had to strain to start urination? Less than half the time     How many times did you typically get up at  night to urinate? 2 Times     Total IPSS Score 16       Quality of Life due to urinary symptoms   If you were to spend the rest of your life with your urinary condition just the way it is now how would you feel about that? Mostly Disatisfied          Score:  1-7 Mild 8-19 Moderate 20-35 Severe    PMH: Past Medical History:  Diagnosis Date  . Arthritis   . BPH (benign prostatic hyperplasia)   . Cancer (HCC)    Basal Cell  . Chronic kidney disease   . Coronary artery disease   . Diabetes mellitus ([place])   . Ejaculatory disorder   . Erectile dysfunction   . Frequency   . GERD (gastroesophageal reflux disease)   . Gross hematuria   . Heart disease   . Hematuria   . HTN (hypertension)   . Hyperlipidemia   . Incomplete bladder emptying   . Microscopic hematuria   . Myocardial infarction ([place])   . Neuropathy   . Nocturia   . Rotator cuff tear left  . TIA (transient ischemic attack)   . Tick bite of multiple sites 5 days ago   chest, and groin area  Surgical History: Past Surgical History:  Procedure Laterality Date  . cardiac stents    . CORONARY ANGIOPLASTY  2001, 2008   [facility]  . DUPUYTREN CONTRACTURE RELEASE Right 04/30/2016   Procedure: DUPUYTREN CONTRACTURE RELEASE;  Surgeon: Leanor Kail, MD;  Location: [facility] ORS;  Service: Orthopedics;  Laterality: Right;  . ROTATOR CUFF REPAIR Bilateral   . TRANSURETHRAL RESECTION OF PROSTATE N/A 07/09/2015   Procedure: TRANSURETHRAL RESECTION OF THE PROSTATE (TURP);  Surgeon: Hollice Espy, MD;  Location: [facility] ORS;  Service: Urology;  Laterality: N/A;    Home Medications:  Allergies as of 08/01/2016      Reactions   B Complex Formula 1 Hives      Medication List       Accurate as of 08/01/16 11:59 PM. Always use your most recent med list.          acetaminophen 650 MG CR tablet Commonly known as:  TYLENOL Take 650 mg by mouth every 8 (eight) hours as needed for pain.   amLODipine 5 MG tablet Commonly known  as:  NORVASC TAKE 1 TABLET BY MOUTH DAILY   aspirin EC 81 MG tablet Take 81 mg by mouth daily.   clopidogrel 75 MG tablet Commonly known as:  PLAVIX Take 75 mg by mouth every other day.   LEVEMIR FLEXTOUCH 100 UNIT/ML Pen Generic drug:  Insulin Detemir INJECT 50 UNITS IN THE MORNING   lisinopril 10 MG tablet Commonly known as:  PRINIVIL,ZESTRIL TAKE 1 TABLET ONE TIME DAILY   metFORMIN 1000 MG tablet Commonly known as:  GLUCOPHAGE Take 1,000 mg by mouth 2 (two) times daily with a meal.   metoprolol succinate 50 MG 24 hr tablet Commonly known as:  TOPROL-XL Take 25 mg by mouth daily.   multivitamin with minerals Tabs tablet Take 1 tablet by mouth daily.   NOVOLOG FLEXPEN 100 UNIT/ML FlexPen Generic drug:  insulin aspart INJ 12  UNITS [place] every morning and 10 units every evening   pantoprazole 40 MG tablet Commonly known as:  PROTONIX Take 40 mg by mouth daily.   rosuvastatin 5 MG tablet Commonly known as:  CRESTOR Take 5 mg by mouth every other day.   tamsulosin 0.4 MG Caps capsule Commonly known as:  FLOMAX Take 1 capsule (0.4 mg total) by mouth daily.       Allergies:  Allergies  Allergen Reactions  . B Complex Formula 1 Hives    Family History: Family History  Problem Relation Age of Onset  . Ovarian cancer Sister   . Kidney disease Brother        born one kidney  . Bladder Cancer Neg Hx   . Prostate cancer Neg Hx     Social History:  reports that he has been smoking Cigarettes.  He has a 50.00 pack-year smoking history. He has never used smokeless tobacco. He reports that he does not drink alcohol or use drugs.  ROS: UROLOGY Frequent Urination?: No Hard to postpone urination?: No Burning/pain with urination?: No Get up at night to urinate?: Yes Leakage of urine?: No Urine stream starts and stops?: No Trouble starting stream?: No Do you have to strain to urinate?: No Blood in urine?: No Urinary tract infection?: No Sexually transmitted  disease?: No Injury to kidneys or bladder?: No Painful intercourse?: No Weak stream?: No Erection problems?: No Penile pain?: No  Gastrointestinal Nausea?: No Vomiting?: No Indigestion/heartburn?: No Diarrhea?: No Constipation?: No  Constitutional Fever: No Night sweats?: No Weight loss?: No Fatigue?: No  Skin Skin rash/lesions?: No Itching?: Yes  Eyes Blurred vision?: No Double vision?: No  Ears/Nose/Throat Sore throat?: No Sinus problems?: Yes  Hematologic/Lymphatic Swollen glands?: No Easy bruising?: Yes  Cardiovascular Leg swelling?: No Chest pain?: No  Respiratory Cough?: Yes Shortness of breath?: No  Endocrine Excessive thirst?: No  Musculoskeletal Back pain?: Yes Joint pain?: No  Neurological Headaches?: No Dizziness?: No  Psychologic Depression?: No Anxiety?: No  Physical Exam: BP 127/61   Pulse 64   Ht 5\' 8"  (1.727 m)   Wt 175 lb (79.4 kg)   BMI 26.61 kg/m   Constitutional:  Alert and oriented, No acute distress.  Accompanied by wife today. HEENT: [place] AT, moist mucus membranes.  Trachea midline, no masses. Cardiovascular: No clubbing, cyanosis, or edema. Respiratory: Normal respiratory effort, no increased work of breathing.  Skin: No rashes, bruises or suspicious lesions. Neurologic: Grossly intact, no focal deficits, moving all 4 extremities. Psychiatric: Normal mood and affect.  Laboratory Data: Lab Results  Component Value Date   WBC 5.8 04/23/2016   HGB 13.1 04/23/2016   HCT 38.2 (L) 04/23/2016   MCV 93.5 04/23/2016   PLT 230 04/23/2016    Lab Results  Component Value Date   CREATININE 1.00 04/23/2016     Assessment & Plan:    1. BPH with obstruction/lower urinary tract symptoms/  incomplete bladder emptying S/p TURP with improved obstructive symptoms Continues to have severe OAB symptoms Continue flomax given incomplete bladder emptying  2. OAB (overactive bladder) / urge incontinence Suspect OAB 2/2  diabetic neuropathy Failed Toviaz due to side effects and Mybetriq 50 mg Hesitant to try additional anticholinergic given his history of memory and attention issues which may exacerbate the problem  Lengthy discussion today about alternatives including posterior nerve stimulation, Botox, InterStim. Given his incomplete bladder emptying, I'm a little bit worried about Botox but the KUB good candidate for posterior nerve stimulation. We discussed this procedures at length today, course, and efficacy rates. He was given literature on the topic. He will think about it and let us know how would like to proceed.   Hollice Espy, MD  [facility] [address] [place], [place], [place] [address] [phone]

## 2016-12-27 ENCOUNTER — Other Ambulatory Visit: Payer: Self-pay | Admitting: Urology

## 2017-01-19 ENCOUNTER — Emergency Department
Admission: EM | Admit: 2017-01-19 | Discharge: 2017-01-19 | Disposition: A | Discharge status: Other Acute Inpatient Hospital | Payer: Medicare Other | Attending: Emergency Medicine | Admitting: Emergency Medicine

## 2017-01-19 ENCOUNTER — Encounter: Payer: Self-pay | Admitting: Emergency Medicine

## 2017-01-19 ENCOUNTER — Other Ambulatory Visit: Payer: Self-pay

## 2017-01-19 DIAGNOSIS — Z7902 Long term (current) use of antithrombotics/antiplatelets: Secondary | ICD-10-CM | POA: Diagnosis not present

## 2017-01-19 DIAGNOSIS — I129 Hypertensive chronic kidney disease with stage 1 through stage 4 chronic kidney disease, or unspecified chronic kidney disease: Secondary | ICD-10-CM | POA: Insufficient documentation

## 2017-01-19 DIAGNOSIS — S6991XA Unspecified injury of right wrist, hand and finger(s), initial encounter: Secondary | ICD-10-CM | POA: Diagnosis present

## 2017-01-19 DIAGNOSIS — E119 Type 2 diabetes mellitus without complications: Secondary | ICD-10-CM | POA: Insufficient documentation

## 2017-01-19 DIAGNOSIS — Z23 Encounter for immunization: Secondary | ICD-10-CM | POA: Diagnosis not present

## 2017-01-19 DIAGNOSIS — Z794 Long term (current) use of insulin: Secondary | ICD-10-CM | POA: Diagnosis not present

## 2017-01-19 DIAGNOSIS — Y93H2 Activity, gardening and landscaping: Secondary | ICD-10-CM | POA: Insufficient documentation

## 2017-01-19 DIAGNOSIS — T23291A Burn of second degree of multiple sites of right wrist and hand, initial encounter: Secondary | ICD-10-CM | POA: Insufficient documentation

## 2017-01-19 DIAGNOSIS — Z7982 Long term (current) use of aspirin: Secondary | ICD-10-CM | POA: Insufficient documentation

## 2017-01-19 DIAGNOSIS — X04XXXA Exposure to ignition of highly flammable material, initial encounter: Secondary | ICD-10-CM | POA: Diagnosis not present

## 2017-01-19 DIAGNOSIS — Y999 Unspecified external cause status: Secondary | ICD-10-CM | POA: Diagnosis not present

## 2017-01-19 DIAGNOSIS — Y92017 Garden or yard in single-family (private) house as the place of occurrence of the external cause: Secondary | ICD-10-CM | POA: Insufficient documentation

## 2017-01-19 DIAGNOSIS — T23261A Burn of second degree of back of right hand, initial encounter: Secondary | ICD-10-CM

## 2017-01-19 DIAGNOSIS — T310 Burns involving less than 10% of body surface: Secondary | ICD-10-CM | POA: Diagnosis not present

## 2017-01-19 DIAGNOSIS — F17210 Nicotine dependence, cigarettes, uncomplicated: Secondary | ICD-10-CM | POA: Insufficient documentation

## 2017-01-19 DIAGNOSIS — N189 Chronic kidney disease, unspecified: Secondary | ICD-10-CM | POA: Insufficient documentation

## 2017-01-19 LAB — CBC WITH DIFFERENTIAL/PLATELET
BASOS ABS: 0.1 10*3/uL (ref 0–0.1)
BASOS PCT: 1 %
Eosinophils Absolute: 0.3 10*3/uL (ref 0–0.7)
Eosinophils Relative: 3 %
HEMATOCRIT: 33 % — AB (ref 40.0–52.0)
HEMOGLOBIN: 10.6 g/dL — AB (ref 13.0–18.0)
Lymphocytes Relative: 19 %
Lymphs Abs: 1.7 10*3/uL (ref 1.0–3.6)
MCH: 27.9 pg (ref 26.0–34.0)
MCHC: 32.3 g/dL (ref 32.0–36.0)
MCV: 86.3 fL (ref 80.0–100.0)
Monocytes Absolute: 0.7 10*3/uL (ref 0.2–1.0)
Monocytes Relative: 8 %
NEUTROS ABS: 6.2 10*3/uL (ref 1.4–6.5)
NEUTROS PCT: 69 %
Platelets: 322 10*3/uL (ref 150–440)
RBC: 3.82 MIL/uL — AB (ref 4.40–5.90)
RDW: 17.6 % — AB (ref 11.5–14.5)
WBC: 8.9 10*3/uL (ref 3.8–10.6)

## 2017-01-19 LAB — COMPREHENSIVE METABOLIC PANEL
ALK PHOS: 60 U/L (ref 38–126)
ALT: 21 U/L (ref 17–63)
ANION GAP: 12 (ref 5–15)
AST: 31 U/L (ref 15–41)
Albumin: 4.6 g/dL (ref 3.5–5.0)
BILIRUBIN TOTAL: 0.7 mg/dL (ref 0.3–1.2)
BUN: 20 mg/dL (ref 6–20)
CALCIUM: 9.8 mg/dL (ref 8.9–10.3)
CO2: 22 mmol/L (ref 22–32)
Chloride: 105 mmol/L (ref 101–111)
Creatinine, Ser: 1.03 mg/dL (ref 0.61–1.24)
GFR calc non Af Amer: >60 mL/min (ref >60)
Glucose, Bld: 143 mg/dL — ABNORMAL HIGH (ref 65–99)
Potassium: 4.1 mmol/L (ref 3.5–5.1)
SODIUM: 139 mmol/L (ref 135–145)
TOTAL PROTEIN: 7.8 g/dL (ref 6.5–8.1)

## 2017-01-19 LAB — GLUCOSE, CAPILLARY: Glucose-Capillary: 97 mg/dL (ref 65–99)

## 2017-01-19 MED ORDER — TETANUS-DIPHTH-ACELL PERTUSSIS 5-2.5-18.5 LF-MCG/0.5 IM SUSP
0.5000 mL | Freq: Once | INTRAMUSCULAR | Status: AC
  Administered 2017-01-19: 0.5 mL via INTRAMUSCULAR
  Filled 2017-01-19: qty 0.5

## 2017-01-19 MED ORDER — ONDANSETRON HCL 4 MG/2ML IJ SOLN
4.0000 mg | Freq: Once | INTRAMUSCULAR | Status: AC
  Administered 2017-01-19: 4 mg via INTRAVENOUS
  Filled 2017-01-19: qty 2

## 2017-01-19 MED ORDER — LACTATED RINGERS IV BOLUS (SEPSIS)
1000.0000 mL | Freq: Once | INTRAVENOUS | Status: AC
  Administered 2017-01-19: 1000 mL via INTRAVENOUS

## 2017-01-19 MED ORDER — MORPHINE SULFATE (PF) 4 MG/ML IV SOLN
4.0000 mg | Freq: Once | INTRAVENOUS | Status: AC
  Administered 2017-01-19: 4 mg via INTRAVENOUS
  Filled 2017-01-19: qty 1

## 2017-01-19 MED ORDER — KETOROLAC TROMETHAMINE 30 MG/ML IJ SOLN
15.0000 mg | Freq: Once | INTRAMUSCULAR | Status: AC
  Administered 2017-01-19: 15 mg via INTRAVENOUS
  Filled 2017-01-19: qty 1
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
## 2017-01-19 NOTE — ED Notes (Signed)
EMTALA completed per [place], Therapist, sports. VS within 30 mins of transfer. Consent signed by pt.

## 2017-01-19 NOTE — ED Notes (Signed)
RT hand dressed by MD Rifenbark at this time

## 2017-01-19 NOTE — ED Notes (Signed)
Pt in NAD at time of d/c, [facility] to trasnfer pt to [facility]. VS stable

## 2017-01-19 NOTE — ED Notes (Signed)
Sandwich tray given at this time per MD
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
## 2017-01-19 NOTE — ED Notes (Signed)
Spouse number, [phone], [phone]

## 2017-01-28 MED ORDER — ATORVASTATIN CALCIUM 10 MG PO TABS
20.00 mg | ORAL_TABLET | ORAL | Status: DC
Start: 2017-01-29 — End: 2017-01-28

## 2017-01-28 MED ORDER — IRON PO
1.00 | ORAL | Status: DC
Start: 2017-01-29 — End: 2017-01-28

## 2017-01-28 MED ORDER — GENERIC EXTERNAL MEDICATION
Status: DC
Start: ? — End: 2017-01-28

## 2017-01-28 MED ORDER — DEXTROSE 50 % IV SOLN
12.50 | INTRAVENOUS | Status: DC
Start: ? — End: 2017-01-28

## 2017-01-28 MED ORDER — NICOTINE POLACRILEX 4 MG MT LOZG
4.00 mg | LOZENGE | OROMUCOSAL | Status: DC
Start: ? — End: 2017-01-28

## 2017-01-28 MED ORDER — POLYETHYLENE GLYCOL 3350 17 G PO PACK
17.00 | PACK | ORAL | Status: DC
Start: 2017-01-29 — End: 2017-01-28

## 2017-01-28 MED ORDER — OXYCODONE HCL 5 MG PO TABS
10.00 mg | ORAL_TABLET | ORAL | Status: DC
Start: ? — End: 2017-01-28

## 2017-01-28 MED ORDER — METOPROLOL SUCCINATE ER 50 MG PO TB24
25.00 mg | ORAL_TABLET | ORAL | Status: DC
Start: 2017-01-29 — End: 2017-01-28

## 2017-01-28 MED ORDER — DIPHENHYDRAMINE HCL 25 MG PO CAPS
25.00 mg | ORAL_CAPSULE | ORAL | Status: DC
Start: ? — End: 2017-01-28

## 2017-01-28 MED ORDER — TAMSULOSIN HCL 0.4 MG PO CAPS
.40 mg | ORAL_CAPSULE | ORAL | Status: DC
Start: 2017-01-29 — End: 2017-01-28

## 2017-01-28 MED ORDER — HEPARIN SODIUM (PORCINE) 5000 UNIT/ML IJ SOLN
5000.00 | INTRAMUSCULAR | Status: DC
Start: 2017-01-29 — End: 2017-01-28

## 2017-01-28 MED ORDER — AMLODIPINE BESYLATE 5 MG PO TABS
5.00 mg | ORAL_TABLET | ORAL | Status: DC
Start: 2017-01-29 — End: 2017-01-28

## 2017-01-28 MED ORDER — CETIRIZINE HCL 5 MG PO TABS
5.00 mg | ORAL_TABLET | ORAL | Status: DC
Start: 2017-01-29 — End: 2017-01-28

## 2017-01-28 MED ORDER — ONDANSETRON HCL 8 MG PO TABS
4.00 mg | ORAL_TABLET | ORAL | Status: DC
Start: ? — End: 2017-01-28

## 2017-01-28 MED ORDER — VITAMIN C 500 MG PO TABS
500.00 mg | ORAL_TABLET | ORAL | Status: DC
Start: 2017-01-29 — End: 2017-01-28

## 2017-01-28 MED ORDER — INSULIN LISPRO 100 UNIT/ML ~~LOC~~ SOLN
14.00 | SUBCUTANEOUS | Status: DC
Start: 2017-01-29 — End: 2017-01-28

## 2017-01-28 MED ORDER — BACITRACIN 500 UNIT/GM EX OINT
1.00 | TOPICAL_OINTMENT | CUTANEOUS | Status: DC
Start: ? — End: 2017-01-28

## 2017-01-28 MED ORDER — INSULIN LISPRO 100 UNIT/ML ~~LOC~~ SOLN
1.00 | SUBCUTANEOUS | Status: DC
Start: 2017-01-29 — End: 2017-01-28

## 2017-01-28 MED ORDER — GENERIC EXTERNAL MEDICATION
45.00 | Status: DC
Start: 2017-01-29 — End: 2017-01-28

## 2017-01-28 MED ORDER — DOCUSATE SODIUM 100 MG PO CAPS
100.00 mg | ORAL_CAPSULE | ORAL | Status: DC
Start: 2017-01-29 — End: 2017-01-28

## 2017-01-28 MED ORDER — FENTANYL CITRATE (PF) 2500 MCG/50ML IJ SOLN
25.00 | INTRAMUSCULAR | Status: DC
Start: ? — End: 2017-01-28

## 2017-01-28 MED ORDER — FAMOTIDINE 20 MG PO TABS
20.00 mg | ORAL_TABLET | ORAL | Status: DC
Start: 2017-01-29 — End: 2017-01-28

## 2017-01-28 MED ORDER — ZINC SULFATE 220 (50 ZN) MG PO CAPS
220.00 mg | ORAL_CAPSULE | ORAL | Status: DC
Start: 2017-01-29 — End: 2017-01-28

## 2017-01-28 MED ORDER — ACETAMINOPHEN 500 MG PO TABS
1000.00 mg | ORAL_TABLET | ORAL | Status: DC
Start: 2017-01-29 — End: 2017-01-28

## 2017-01-28 MED ORDER — SILVER SULFADIAZINE 1 % EX CREA
TOPICAL_CREAM | CUTANEOUS | Status: DC
Start: 2017-01-29 — End: 2017-01-28

## 2017-01-28 MED ORDER — SENNOSIDES 8.6 MG PO TABS
1.00 | ORAL_TABLET | ORAL | Status: DC
Start: 2017-01-29 — End: 2017-01-28

## 2017-01-28 MED ORDER — FENTANYL CITRATE (PF) 2500 MCG/50ML IJ SOLN
50.00 | INTRAMUSCULAR | Status: DC
Start: ? — End: 2017-01-28

## 2017-01-28 MED ORDER — NICOTINE 21 MG/24HR TD PT24
1.00 | MEDICATED_PATCH | TRANSDERMAL | Status: DC
Start: 2017-01-29 — End: 2017-01-28

## 2017-02-17 DIAGNOSIS — N1831 Chronic kidney disease, stage 3a: Secondary | ICD-10-CM | POA: Insufficient documentation

## 2017-02-17 DIAGNOSIS — N183 Chronic kidney disease, stage 3 unspecified: Secondary | ICD-10-CM | POA: Insufficient documentation

## 2017-03-23 DIAGNOSIS — Z794 Long term (current) use of insulin: Secondary | ICD-10-CM

## 2017-03-23 DIAGNOSIS — E119 Type 2 diabetes mellitus without complications: Secondary | ICD-10-CM

## 2017-03-23 DIAGNOSIS — I10 Essential (primary) hypertension: Secondary | ICD-10-CM | POA: Diagnosis present

## 2018-01-20 ENCOUNTER — Other Ambulatory Visit: Payer: Self-pay | Admitting: Unknown Physician Specialty

## 2018-01-20 DIAGNOSIS — M545 Low back pain: Principal | ICD-10-CM

## 2018-01-20 DIAGNOSIS — G8911 Acute pain due to trauma: Secondary | ICD-10-CM

## 2018-01-21 ENCOUNTER — Ambulatory Visit
Admission: RE | Admit: 2018-01-21 | Discharge: 2018-01-21 | Disposition: A | Discharge status: Home / Self Care | Payer: Medicare Other | Source: Ambulatory Visit | Attending: Unknown Physician Specialty | Admitting: Unknown Physician Specialty

## 2018-01-21 DIAGNOSIS — M48061 Spinal stenosis, lumbar region without neurogenic claudication: Secondary | ICD-10-CM | POA: Insufficient documentation

## 2018-01-21 DIAGNOSIS — X58XXXA Exposure to other specified factors, initial encounter: Secondary | ICD-10-CM | POA: Diagnosis not present

## 2018-01-21 DIAGNOSIS — M5127 Other intervertebral disc displacement, lumbosacral region: Secondary | ICD-10-CM | POA: Insufficient documentation

## 2018-01-21 DIAGNOSIS — G8911 Acute pain due to trauma: Secondary | ICD-10-CM | POA: Insufficient documentation

## 2018-01-21 DIAGNOSIS — M545 Low back pain: Secondary | ICD-10-CM

## 2018-01-21 DIAGNOSIS — R29890 Loss of height: Secondary | ICD-10-CM | POA: Insufficient documentation

## 2018-01-21 DIAGNOSIS — M5126 Other intervertebral disc displacement, lumbar region: Secondary | ICD-10-CM | POA: Diagnosis not present

## 2018-01-21 DIAGNOSIS — S32049A Unspecified fracture of fourth lumbar vertebra, initial encounter for closed fracture: Secondary | ICD-10-CM | POA: Diagnosis not present

## 2018-01-26 ENCOUNTER — Encounter
Admission: RE | Disposition: A | Discharge status: Home / Self Care | Payer: Self-pay | Source: Ambulatory Visit | Attending: Orthopedic Surgery

## 2018-01-26 ENCOUNTER — Ambulatory Visit: Payer: Medicare Other

## 2018-01-26 ENCOUNTER — Encounter: Payer: Self-pay | Admitting: *Deleted

## 2018-01-26 ENCOUNTER — Ambulatory Visit
Admission: RE | Admit: 2018-01-26 | Discharge: 2018-01-26 | Disposition: A | Discharge status: Home / Self Care | Payer: Medicare Other | Source: Ambulatory Visit | Attending: Orthopedic Surgery | Admitting: Orthopedic Surgery

## 2018-01-26 ENCOUNTER — Other Ambulatory Visit: Payer: Self-pay

## 2018-01-26 DIAGNOSIS — S32040A Wedge compression fracture of fourth lumbar vertebra, initial encounter for closed fracture: Secondary | ICD-10-CM | POA: Diagnosis present

## 2018-01-26 DIAGNOSIS — E1122 Type 2 diabetes mellitus with diabetic chronic kidney disease: Secondary | ICD-10-CM | POA: Diagnosis not present

## 2018-01-26 DIAGNOSIS — S32050A Wedge compression fracture of fifth lumbar vertebra, initial encounter for closed fracture: Secondary | ICD-10-CM | POA: Insufficient documentation

## 2018-01-26 DIAGNOSIS — Z79899 Other long term (current) drug therapy: Secondary | ICD-10-CM | POA: Diagnosis not present

## 2018-01-26 DIAGNOSIS — E785 Hyperlipidemia, unspecified: Secondary | ICD-10-CM | POA: Diagnosis not present

## 2018-01-26 DIAGNOSIS — Z7982 Long term (current) use of aspirin: Secondary | ICD-10-CM | POA: Insufficient documentation

## 2018-01-26 DIAGNOSIS — Z8673 Personal history of transient ischemic attack (TIA), and cerebral infarction without residual deficits: Secondary | ICD-10-CM | POA: Diagnosis not present

## 2018-01-26 DIAGNOSIS — Z794 Long term (current) use of insulin: Secondary | ICD-10-CM | POA: Insufficient documentation

## 2018-01-26 DIAGNOSIS — Z419 Encounter for procedure for purposes other than remedying health state, unspecified: Secondary | ICD-10-CM

## 2018-01-26 DIAGNOSIS — W19XXXA Unspecified fall, initial encounter: Secondary | ICD-10-CM | POA: Diagnosis not present

## 2018-01-26 DIAGNOSIS — I1310 Hypertensive heart and chronic kidney disease without heart failure, with stage 1 through stage 4 chronic kidney disease, or unspecified chronic kidney disease: Secondary | ICD-10-CM | POA: Diagnosis not present

## 2018-01-26 DIAGNOSIS — I2510 Atherosclerotic heart disease of native coronary artery without angina pectoris: Secondary | ICD-10-CM | POA: Insufficient documentation

## 2018-01-26 DIAGNOSIS — Z87891 Personal history of nicotine dependence: Secondary | ICD-10-CM | POA: Diagnosis not present

## 2018-01-26 DIAGNOSIS — K219 Gastro-esophageal reflux disease without esophagitis: Secondary | ICD-10-CM | POA: Diagnosis not present

## 2018-01-26 DIAGNOSIS — N183 Chronic kidney disease, stage 3 (moderate): Secondary | ICD-10-CM | POA: Insufficient documentation

## 2018-01-26 DIAGNOSIS — N400 Enlarged prostate without lower urinary tract symptoms: Secondary | ICD-10-CM | POA: Insufficient documentation

## 2018-01-26 HISTORY — PX: KYPHOPLASTY: SHX5884

## 2018-01-26 LAB — GLUCOSE, CAPILLARY
GLUCOSE-CAPILLARY: 169 mg/dL — AB (ref 70–99)
Glucose-Capillary: 148 mg/dL — ABNORMAL HIGH (ref 70–99)

## 2018-01-26 SURGERY — KYPHOPLASTY
Anesthesia: General

## 2018-01-26 MED ORDER — BUPIVACAINE-EPINEPHRINE (PF) 0.5% -1:200000 IJ SOLN
INTRAMUSCULAR | Status: AC
  Filled 2018-01-26: qty 30

## 2018-01-26 MED ORDER — HYDROCODONE-ACETAMINOPHEN 5-325 MG PO TABS: 1.0000 | ORAL_TABLET | ORAL | Status: DC | PRN

## 2018-01-26 MED ORDER — BUPIVACAINE-EPINEPHRINE (PF) 0.5% -1:200000 IJ SOLN
INTRAMUSCULAR | Status: DC | PRN
  Administered 2018-01-26: 20 mL

## 2018-01-26 MED ORDER — DEXMEDETOMIDINE HCL IN NACL 200 MCG/50ML IV SOLN
INTRAVENOUS | Status: DC | PRN
  Administered 2018-01-26: 4 ug via INTRAVENOUS
  Administered 2018-01-26 (×2): 8 ug via INTRAVENOUS

## 2018-01-26 MED ORDER — KETAMINE HCL 50 MG/ML IJ SOLN
INTRAMUSCULAR | Status: DC | PRN
  Administered 2018-01-26: 10 mg via INTRAMUSCULAR
  Administered 2018-01-26: 30 mg via INTRAVENOUS

## 2018-01-26 MED ORDER — LIDOCAINE HCL (PF) 1 % IJ SOLN
INTRAMUSCULAR | Status: DC | PRN
  Administered 2018-01-26: 30 mL

## 2018-01-26 MED ORDER — FAMOTIDINE 20 MG PO TABS: 20.0000 mg | ORAL_TABLET | Freq: Once | ORAL | Status: DC

## 2018-01-26 MED ORDER — PROPOFOL 10 MG/ML IV BOLUS
INTRAVENOUS | Status: DC | PRN
  Administered 2018-01-26 (×2): 10 mg via INTRAVENOUS
  Administered 2018-01-26: 20 mg via INTRAVENOUS

## 2018-01-26 MED ORDER — ONDANSETRON HCL 4 MG/2ML IJ SOLN: 4.0000 mg | Freq: Four times a day (QID) | INTRAMUSCULAR | Status: DC | PRN

## 2018-01-26 MED ORDER — CEFAZOLIN SODIUM-DEXTROSE 2-4 GM/100ML-% IV SOLN
2.0000 g | Freq: Once | INTRAVENOUS | Status: AC
  Administered 2018-01-26: 2 g via INTRAVENOUS

## 2018-01-26 MED ORDER — SODIUM CHLORIDE 0.9 % IV SOLN
INTRAVENOUS | Status: DC
  Administered 2018-01-26: 12:00:00 via INTRAVENOUS

## 2018-01-26 MED ORDER — METOCLOPRAMIDE HCL 5 MG/ML IJ SOLN: 5.0000 mg | Freq: Three times a day (TID) | INTRAMUSCULAR | Status: DC | PRN

## 2018-01-26 MED ORDER — CEFAZOLIN SODIUM-DEXTROSE 2-4 GM/100ML-% IV SOLN
INTRAVENOUS | Status: AC
  Filled 2018-01-26: qty 100

## 2018-01-26 MED ORDER — FENTANYL CITRATE (PF) 100 MCG/2ML IJ SOLN: 25.0000 ug | INTRAMUSCULAR | Status: DC | PRN

## 2018-01-26 MED ORDER — PHENYLEPHRINE HCL 10 MG/ML IJ SOLN
INTRAMUSCULAR | Status: DC | PRN
  Administered 2018-01-26 (×2): 100 ug via INTRAVENOUS

## 2018-01-26 MED ORDER — SODIUM CHLORIDE 0.9 % IV SOLN: INTRAVENOUS | Status: DC

## 2018-01-26 MED ORDER — HYDROCODONE-ACETAMINOPHEN 5-325 MG PO TABS: 1.0000 | ORAL_TABLET | Freq: Four times a day (QID) | ORAL | 0 refills | Status: DC | PRN

## 2018-01-26 MED ORDER — FAMOTIDINE 20 MG PO TABS
ORAL_TABLET | ORAL | Status: AC
  Administered 2018-01-26: 20 mg
  Filled 2018-01-26: qty 1

## 2018-01-26 MED ORDER — FENTANYL CITRATE (PF) 100 MCG/2ML IJ SOLN
INTRAMUSCULAR | Status: AC
  Filled 2018-01-26: qty 2

## 2018-01-26 MED ORDER — ONDANSETRON HCL 4 MG PO TABS: 4.0000 mg | ORAL_TABLET | Freq: Four times a day (QID) | ORAL | Status: DC | PRN

## 2018-01-26 MED ORDER — FENTANYL CITRATE (PF) 100 MCG/2ML IJ SOLN
INTRAMUSCULAR | Status: DC | PRN
  Administered 2018-01-26: 50 ug via INTRAVENOUS
  Administered 2018-01-26 (×2): 25 ug via INTRAVENOUS

## 2018-01-26 MED ORDER — METOCLOPRAMIDE HCL 10 MG PO TABS: 5.0000 mg | ORAL_TABLET | Freq: Three times a day (TID) | ORAL | Status: DC | PRN

## 2018-01-26 SURGICAL SUPPLY — 21 items
CEMENT KYPHON CX01A KIT/MIXER (Cement) ×3 IMPLANT
COVER WAND RF STERILE (DRAPES) ×3 IMPLANT
DERMABOND ADVANCED (GAUZE/BANDAGES/DRESSINGS) ×2
DERMABOND ADVANCED .7 DNX12 (GAUZE/BANDAGES/DRESSINGS) ×1 IMPLANT
DEVICE BIOPSY BONE KYPHX (INSTRUMENTS) ×3 IMPLANT
DRAPE C-ARM XRAY 36X54 (DRAPES) ×3 IMPLANT
DURAPREP 26ML APPLICATOR (WOUND CARE) ×3 IMPLANT
GLOVE SURG SYN 9.0  PF PI (GLOVE) ×2
GLOVE SURG SYN 9.0 PF PI (GLOVE) ×1 IMPLANT
GOWN SRG 2XL LVL 4 RGLN SLV (GOWNS) ×1 IMPLANT
GOWN STRL NON-REIN 2XL LVL4 (GOWNS) ×2
GOWN STRL REUS W/ TWL LRG LVL3 (GOWN DISPOSABLE) ×1 IMPLANT
GOWN STRL REUS W/TWL LRG LVL3 (GOWN DISPOSABLE) ×2
NEEDLE SPNL 20GX3.5 QUINCKE YW (NEEDLE) ×3 IMPLANT
PACK KYPHOPLASTY (MISCELLANEOUS) ×3 IMPLANT
STRAP SAFETY 5IN WIDE (MISCELLANEOUS) ×3 IMPLANT
SYS CARTRIDGE BONE CEMENT 8ML (SYSTAGENIX WOUND MANAGEMENT) ×3
SYSTEM CARTRIDG BONE CEMNT 8ML (SYSTAGENIX WOUND MANAGEMENT) ×1 IMPLANT
SYSTEM GUN BONE FILLER SZ2 (MISCELLANEOUS) ×3 IMPLANT
TRAY KYPHOPAK 15/3 EXPRESS 1ST (MISCELLANEOUS) IMPLANT
TRAY KYPHOPAK 20/3 EXPRESS 1ST (MISCELLANEOUS) ×3 IMPLANT

## 2018-01-26 NOTE — H&P (Signed)
Reviewed paper H+P, will be scanned into chart. No changes noted. MRI showed significant edema and fracture changes at L4 and L5, plan kyphoplasty at those levels today

## 2018-01-26 NOTE — Discharge Instructions (Addendum)
Take it easy today and tomorrow, resume more normal activities on Thursday as tolerated.  Pain medicine as directed.  Remove Band-Aids on Thursday then okay to shower.    AMBULATORY SURGERY  DISCHARGE INSTRUCTIONS   1) The drugs that you were given will stay in your system until tomorrow so for the next 24 hours you should not:  A) Drive an automobile B) Make any legal decisions C) Drink any alcoholic beverage   2) You may resume regular meals tomorrow.  Today it is better to start with liquids and gradually work up to solid foods.  You may eat anything you prefer, but it is better to start with liquids, then soup and crackers, and gradually work up to solid foods.   3) Please notify your doctor immediately if you have any unusual bleeding, trouble breathing, redness and pain at the surgery site, drainage, fever, or pain not relieved by medication.    4) Additional Instructions:        Please contact your physician with any problems or Same Day Surgery at [phone], Monday through Friday 6 am to 4 pm, or [place] at [facility] number at [phone].

## 2018-01-26 NOTE — Transfer of Care (Signed)
Immediate Anesthesia Transfer of Care Note  Patient: Gregory Tran  Procedure(s) Performed: [place] (N/A )  Patient Location: PACU  Anesthesia Type:General  Level of Consciousness: awake  Airway & Oxygen Therapy: Patient Spontanous Breathing and Patient connected to face mask oxygen  Post-op Assessment: Report given to RN and Post -op Vital signs reviewed and stable  Post vital signs: Reviewed  Last Vitals:  Vitals Value Taken Time  BP 138/64 01/26/2018  2:31 PM  Temp    Pulse 78 01/26/2018  2:32 PM  Resp 11 01/26/2018  2:32 PM  SpO2 100 % 01/26/2018  2:32 PM  Vitals shown include unvalidated device data.  Last Pain:  Vitals:   01/26/18 1134  TempSrc: Temporal  PainSc: 7          Complications: No apparent anesthesia complications

## 2018-01-26 NOTE — Anesthesia Post-op Follow-up Note (Signed)
Anesthesia QCDR form completed.        

## 2018-01-26 NOTE — Anesthesia Preprocedure Evaluation (Addendum)
Anesthesia Evaluation  Patient identified by MRN, date of birth, ID band Patient awake    Reviewed: Allergy & Precautions, H&P , NPO status , Patient's Chart, lab work & pertinent test results  Airway Mallampati: III   Neck ROM: limited    Dental  (+) Teeth Intact   Pulmonary neg COPD, former smoker,           Cardiovascular Exercise Tolerance: Good hypertension, (-) angina+ CAD, + Past MI (20-30 years ago) and + Cardiac Stents (2 stents, placed 6-7 years ago)  (-) CHF    Echo 2018: Normal left ventricular systolic function, ejection fraction 60 to 65%  Mitral regurgitation - mild  Dilated left atrium - mild  Aortic sclerosis  Normal right ventricular systolic function  Mildly elevated right atrial pressure   Neuro/Psych TIA (20-30 years ago)negative psych ROS   GI/Hepatic Neg liver ROS, GERD  ,  Endo/Other  diabetes  Renal/GU CRFRenal disease  negative genitourinary   Musculoskeletal  (+) Arthritis ,   Abdominal   Peds  Hematology  (+) Blood dyscrasia, anemia ,   Anesthesia Other Findings Past Medical History: No date: Arthritis No date: BPH (benign prostatic hyperplasia) No date: Cancer ([place])     Comment:  Basal Cell No date: Chronic kidney disease No date: Coronary artery disease No date: Diabetes mellitus ([place]) No date: Ejaculatory disorder No date: Erectile dysfunction No date: Frequency No date: GERD (gastroesophageal reflux disease) No date: Gross hematuria No date: Heart disease No date: Hematuria No date: HTN (hypertension) No date: Hyperlipidemia No date: Incomplete bladder emptying No date: Microscopic hematuria No date: Myocardial infarction [facility]) No date: Neuropathy No date: Nocturia left: Rotator cuff tear No date: TIA (transient ischemic attack) 5 days ago: Tick bite of multiple sites     Comment:  chest, and groin area  Past Surgical History: No date: cardiac stents 2001,  2008: CORONARY ANGIOPLASTY     Comment:  [place] 04/30/2016: [place]; Right     Comment:  Procedure: DUPUYTREN CONTRACTURE RELEASE;  Surgeon:               Leanor Kail, MD;  Location: [facility] ORS;  Service:               Orthopedics;  Laterality: Right; No date: ROTATOR CUFF REPAIR; Bilateral 07/09/2015: TRANSURETHRAL RESECTION OF PROSTATE; N/A     Comment:  Procedure: TRANSURETHRAL RESECTION OF THE PROSTATE               (TURP);  Surgeon: Hollice Espy, MD;  Location: [facility]               ORS;  Service: Urology;  Laterality: N/A;  BMI    Body Mass Index:  27.72 kg/m      Reproductive/Obstetrics negative OB ROS                           Anesthesia Physical Anesthesia Plan  ASA: III  Anesthesia Plan: General   Post-op Pain Management:    Induction:   PONV Risk Score and Plan: TIVA  Airway Management Planned: Natural Airway and Simple Face Mask  Additional Equipment:   Intra-op Plan:   Post-operative Plan:   Informed Consent: I have reviewed the patients History and Physical, chart, labs and discussed the procedure including the risks, benefits and alternatives for the proposed anesthesia with the patient or authorized representative who has indicated his/her understanding and acceptance.   Dental Advisory Given  Plan Discussed with: Anesthesiologist, CRNA and Surgeon  Anesthesia Plan Comments:        Anesthesia Quick Evaluation

## 2018-01-26 NOTE — Op Note (Signed)
Date  01/28/18  Time 2:38 pm   PATIENT: Gregory Tran   PRE-OPERATIVE DIAGNOSIS:  closed wedge compression fracture of L4 and L5   POST-OPERATIVE DIAGNOSIS:  closed wedge compression fracture of L4 and L5   PROCEDURE:  Procedure(s): KYPHOPLASTY L4 and L5  SURGEON: Laurene Footman, MD   ASSISTANTS: None   ANESTHESIA:   local and MAC   EBL:  No intake/output data recorded.   BLOOD ADMINISTERED:none   DRAINS: none    LOCAL MEDICATIONS USED:  MARCAINE    and XYLOCAINE    SPECIMEN:   L4 and L5 vertebral body biopsies   DISPOSITION OF SPECIMEN:  Pathology   COUNTS:  YES   TOURNIQUET:  * No tourniquets in log *   IMPLANTS: Bone cement   DICTATION: .Dragon Dictation  patient was brought to the operating room and after adequate anesthesia was obtained the patient was placed prone.  C arm was brought in in good visualization of the affected level obtained on both AP and lateral projections.  After patient identification and timeout procedures were completed, local anesthetic was infiltrated with 10 cc 1% Xylocaine infiltrated subcutaneously.  This is done the area on the right side of the planned approach.  The back was then prepped and draped in the usual sterile manner and repeat timeout procedure carried out.  A spinal needle was brought down to the pedicle on the right side of  L4 and L5 and a 50-50 mix of 1% Xylocaine half percent Sensorcaine with epinephrine total of 20 cc injected.  After allowing this to set a small incision was made and the trocar was advanced into the vertebral body in an extrapedicular fashion, first at L4 then L5.  Biopsy was obtained at both levels Drilling was carried out balloon inserted with inflation to  4 cc at L4 and 5 and L5.  When the cement was appropriate consistency 5 cc were injected into the vertebral body at L4, 6 at L5 without extravasation, good fill superior to inferior endplates and from right to left sides along the inferior endplate.   After the cement had set the trochar was removed and permanent C-arm views obtained.  The wound was closed with Dermabond followed by Band-Aid   PLAN OF CARE: Discharge to home after PACU   PATIENT DISPOSITION:  PACU - hemodynamically stable.

## 2018-01-27 ENCOUNTER — Encounter: Payer: Self-pay | Admitting: Orthopedic Surgery

## 2018-01-28 LAB — SURGICAL PATHOLOGY

## 2018-01-28 NOTE — Anesthesia Postprocedure Evaluation (Signed)
Anesthesia Post Note  Patient: Gregory Tran  Procedure(s) Performed: El[place] (N/A )  Patient location during evaluation: PACU Anesthesia Type: MAC Level of consciousness: awake and alert Pain management: pain level controlled Vital Signs Assessment: post-procedure vital signs reviewed and stable Respiratory status: spontaneous breathing, nonlabored ventilation, respiratory function stable and patient connected to nasal cannula oxygen Cardiovascular status: blood pressure returned to baseline and stable Postop Assessment: no apparent nausea or vomiting Anesthetic complications: no     Last Vitals:  Vitals:   01/26/18 1523 01/26/18 1544  BP: (!) 141/56 130/64  Pulse: 60 (!) 55  Resp: 18 18  Temp: (!) 36.2 C   SpO2: 98% 97%    Last Pain:  Vitals:   01/27/18 0814  TempSrc:   PainSc: [place]

## 2018-03-03 ENCOUNTER — Other Ambulatory Visit: Payer: Self-pay | Admitting: Nephrology

## 2018-03-03 DIAGNOSIS — N183 Chronic kidney disease, stage 3 unspecified: Secondary | ICD-10-CM

## 2018-03-10 ENCOUNTER — Ambulatory Visit
Admission: RE | Admit: 2018-03-10 | Discharge: 2018-03-10 | Disposition: A | Discharge status: Home / Self Care | Payer: Medicare Other | Source: Ambulatory Visit | Attending: Nephrology | Admitting: Nephrology

## 2018-03-10 ENCOUNTER — Encounter (INDEPENDENT_AMBULATORY_CARE_PROVIDER_SITE_OTHER): Payer: Self-pay

## 2018-03-10 DIAGNOSIS — N183 Chronic kidney disease, stage 3 unspecified: Secondary | ICD-10-CM

## 2018-04-01 ENCOUNTER — Inpatient Hospital Stay: Payer: Medicare Other | Attending: Hematology and Oncology | Admitting: Hematology and Oncology

## 2018-04-01 ENCOUNTER — Other Ambulatory Visit: Payer: Self-pay

## 2018-04-01 ENCOUNTER — Ambulatory Visit: Payer: Medicare Other

## 2018-04-01 VITALS — BP 138/79 | HR 65 | Temp 97.6°F | Resp 16 | Ht 68.0 in | Wt 185.7 lb

## 2018-04-01 DIAGNOSIS — I2510 Atherosclerotic heart disease of native coronary artery without angina pectoris: Secondary | ICD-10-CM | POA: Insufficient documentation

## 2018-04-01 DIAGNOSIS — E785 Hyperlipidemia, unspecified: Secondary | ICD-10-CM | POA: Insufficient documentation

## 2018-04-01 DIAGNOSIS — K219 Gastro-esophageal reflux disease without esophagitis: Secondary | ICD-10-CM | POA: Insufficient documentation

## 2018-04-01 DIAGNOSIS — E1122 Type 2 diabetes mellitus with diabetic chronic kidney disease: Secondary | ICD-10-CM | POA: Diagnosis not present

## 2018-04-01 DIAGNOSIS — N189 Chronic kidney disease, unspecified: Secondary | ICD-10-CM | POA: Diagnosis not present

## 2018-04-01 DIAGNOSIS — D631 Anemia in chronic kidney disease: Secondary | ICD-10-CM

## 2018-04-01 DIAGNOSIS — R630 Anorexia: Secondary | ICD-10-CM | POA: Insufficient documentation

## 2018-04-01 DIAGNOSIS — I252 Old myocardial infarction: Secondary | ICD-10-CM | POA: Diagnosis not present

## 2018-04-01 DIAGNOSIS — R319 Hematuria, unspecified: Secondary | ICD-10-CM | POA: Insufficient documentation

## 2018-04-01 DIAGNOSIS — Z8673 Personal history of transient ischemic attack (TIA), and cerebral infarction without residual deficits: Secondary | ICD-10-CM | POA: Diagnosis not present

## 2018-04-01 DIAGNOSIS — E1140 Type 2 diabetes mellitus with diabetic neuropathy, unspecified: Secondary | ICD-10-CM | POA: Insufficient documentation

## 2018-04-01 DIAGNOSIS — D649 Anemia, unspecified: Secondary | ICD-10-CM | POA: Insufficient documentation

## 2018-04-01 DIAGNOSIS — Z87891 Personal history of nicotine dependence: Secondary | ICD-10-CM

## 2018-04-01 LAB — CBC WITH DIFFERENTIAL/PLATELET
Abs Immature Granulocytes: 0.06 10*3/uL (ref 0.00–0.07)
Basophils Absolute: 0 10*3/uL (ref 0.0–0.1)
Basophils Relative: 1 %
Eosinophils Absolute: 0.5 10*3/uL (ref 0.0–0.5)
Eosinophils Relative: 7 %
HEMATOCRIT: 29.2 % — AB (ref 39.0–52.0)
Hemoglobin: 9.3 g/dL — ABNORMAL LOW (ref 13.0–17.0)
Immature Granulocytes: 1 %
LYMPHS ABS: 2 10*3/uL (ref 0.7–4.0)
Lymphocytes Relative: 30 %
MCH: 27.1 pg (ref 26.0–34.0)
MCHC: 31.8 g/dL (ref 30.0–36.0)
MCV: 85.1 fL (ref 80.0–100.0)
Monocytes Absolute: 0.6 10*3/uL (ref 0.1–1.0)
Monocytes Relative: 9 %
Neutro Abs: 3.5 10*3/uL (ref 1.7–7.7)
Neutrophils Relative %: 52 %
Platelets: 287 10*3/uL (ref 150–400)
RBC: 3.43 MIL/uL — ABNORMAL LOW (ref 4.22–5.81)
RDW: 15.4 % (ref 11.5–15.5)
WBC: 6.6 10*3/uL (ref 4.0–10.5)
nRBC: 0 % (ref 0.0–0.2)

## 2018-04-01 LAB — TSH: TSH: 1.396 u[IU]/mL (ref 0.350–4.500)

## 2018-04-01 LAB — IRON AND TIBC
Iron: 43 ug/dL — ABNORMAL LOW (ref 45–182)
Saturation Ratios: 10 % — ABNORMAL LOW (ref 17.9–39.5)
TIBC: 442 ug/dL (ref 250–450)
UIBC: 399 ug/dL

## 2018-04-01 LAB — FERRITIN: Ferritin: 13 ng/mL — ABNORMAL LOW (ref 24–336)

## 2018-04-01 LAB — RETICULOCYTES
Immature Retic Fract: 30.1 % — ABNORMAL HIGH (ref 2.3–15.9)
RBC.: 3.49 MIL/uL — AB (ref 4.22–5.81)
Retic Count, Absolute: 73.6 10*3/uL (ref 19.0–186.0)
Retic Ct Pct: 2.1 % (ref 0.4–3.1)

## 2018-04-01 LAB — VITAMIN B12: Vitamin B-12: 231 pg/mL (ref 180–914)

## 2018-04-01 LAB — FOLATE: Folate: 27 ng/mL (ref >5.9)

## 2018-04-01 NOTE — Patient Instructions (Signed)
Epoetin Alfa injection °What is this medicine? °EPOETIN ALFA (e POE e tin AL fa) helps your body make more red blood cells. This medicine is used to treat anemia caused by chronic kidney disease, cancer chemotherapy, or HIV-therapy. It may also be used before surgery if you have anemia. °This medicine may be used for other purposes; ask your health care provider or pharmacist if you have questions. °COMMON BRAND NAME(S): Epogen, Procrit, Retacrit °What should I tell my health care provider before I take this medicine? °They need to know if you have any of these conditions: °-cancer °-heart disease °-high blood pressure °-history of blood clots °-history of stroke °-low levels of folate, iron, or vitamin B12 in the blood °-seizures °-an unusual or allergic reaction to erythropoietin, albumin, benzyl alcohol, hamster proteins, other medicines, foods, dyes, or preservatives °-pregnant or trying to get pregnant °-breast-feeding °How should I use this medicine? °This medicine is for injection into a vein or under the skin. It is usually given by a health care professional in a hospital or clinic setting. °If you get this medicine at home, you will be taught how to prepare and give this medicine. Use exactly as directed. Take your medicine at regular intervals. Do not take your medicine more often than directed. °It is important that you put your used needles and syringes in a special sharps container. Do not put them in a trash can. If you do not have a sharps container, call your pharmacist or healthcare provider to get one. °A special MedGuide will be given to you by the pharmacist with each prescription and refill. Be sure to read this information carefully each time. °Talk to your pediatrician regarding the use of this medicine in children. While this drug may be prescribed for selected conditions, precautions do apply. °Overdosage: If you think you have taken too much of this medicine contact a poison control center  or emergency room at once. °NOTE: This medicine is only for you. Do not share this medicine with others. °What if I miss a dose? °If you miss a dose, take it as soon as you can. If it is almost time for your next dose, take only that dose. Do not take double or extra doses. °What may interact with this medicine? °Interactions have not been studied. °This list may not describe all possible interactions. Give your health care provider a list of all the medicines, herbs, non-prescription drugs, or dietary supplements you use. Also tell them if you smoke, drink alcohol, or use illegal drugs. Some items may interact with your medicine. °What should I watch for while using this medicine? °Your condition will be monitored carefully while you are receiving this medicine. °You may need blood work done while you are taking this medicine. °This medicine may cause a decrease in vitamin B6. You should make sure that you get enough vitamin B6 while you are taking this medicine. Discuss the foods you eat and the vitamins you take with your health care professional. °What side effects may I notice from receiving this medicine? °Side effects that you should report to your doctor or health care professional as soon as possible: °-allergic reactions like skin rash, itching or hives, swelling of the face, lips, or tongue °-seizures °-signs and symptoms of a blood clot such as breathing problems; changes in vision; chest pain; severe, sudden headache; pain, swelling, warmth in the leg; trouble speaking; sudden numbness or weakness of the face, arm or leg °-signs and symptoms of a stroke   like changes in vision; confusion; trouble speaking or understanding; severe headaches; sudden numbness or weakness of the face, arm or leg; trouble walking; dizziness; loss of balance or coordination °Side effects that usually do not require medical attention (report to your doctor or health care professional if they continue or are  bothersome): °-chills °-cough °-dizziness °-fever °-headaches °-joint pain °-muscle cramps °-muscle pain °-nausea, vomiting °-pain, redness, or irritation at site where injected °This list may not describe all possible side effects. Call your doctor for medical advice about side effects. You may report side effects to FDA at 1-800-FDA-1088. °Where should I keep my medicine? °Keep out of the reach of children. °Store in a refrigerator between 2 and 8 degrees C (36 and 46 degrees F). Do not freeze or shake. Throw away any unused portion if using a single-dose vial. Multi-dose vials can be kept in the refrigerator for up to 21 days after the initial dose. Throw away unused medicine. °NOTE: This sheet is a summary. It may not cover all possible information. If you have questions about this medicine, talk to your doctor, pharmacist, or health care provider. °© 2019 Elsevier/Gold Standard (2016-09-12 08:35:19) ° °
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
## 2018-04-01 NOTE — Progress Notes (Signed)
Pt here as new patient. Referred by Dr. Holley Raring for Anemia. Denies any concerns at this time.

## 2018-04-02 ENCOUNTER — Telehealth: Payer: Self-pay

## 2018-04-02 LAB — ERYTHROPOIETIN: Erythropoietin: 52.5 m[IU]/mL — ABNORMAL HIGH (ref 2.6–18.5)

## 2018-04-02 LAB — PROTEIN ELECTROPHORESIS, SERUM
A/G Ratio: 1 (ref 0.7–1.7)
Albumin ELP: 4 g/dL (ref 2.9–4.4)
Alpha-1-Globulin: 0.3 g/dL (ref 0.0–0.4)
Alpha-2-Globulin: 1.1 g/dL — ABNORMAL HIGH (ref 0.4–1.0)
Beta Globulin: 1.3 g/dL (ref 0.7–1.3)
Gamma Globulin: 1.4 g/dL (ref 0.4–1.8)
Globulin, Total: 4.1 g/dL — ABNORMAL HIGH (ref 2.2–3.9)
Total Protein ELP: 8.1 g/dL (ref 6.0–8.5)

## 2018-04-02 LAB — KAPPA/LAMBDA LIGHT CHAINS
Kappa free light chain: 52.5 mg/L — ABNORMAL HIGH (ref 3.3–19.4)
Kappa, lambda light chain ratio: 2.1 — ABNORMAL HIGH (ref 0.26–1.65)
LAMDA FREE LIGHT CHAINS: 25 mg/L (ref 5.7–26.3)

## 2018-04-02 NOTE — Telephone Encounter (Signed)
-----   Message from Karen Kitchens, NP sent at 04/02/2018  8:45 AM EST ----- Please call and make him aware that his B12 level resulted as low. Would like for him to start oral B12 1,000 mcg supplements daily. Will plan on rechecking B12 levels in 1 month, at which time parenteral supplementation will be considered if his levels have not demonstrated improvement.   Gaspar Bidding

## 2018-04-02 NOTE — Telephone Encounter (Signed)
Informed patient of low B12 levels. Advised patient Gregory Loh, NP would like for him to begin taking Vitamin B12 1,000 MCG Daily and to recheck labs in 1 months. Pt. Verbalizes understanding and denies any questions or concerns.

## 2018-04-05 ENCOUNTER — Other Ambulatory Visit: Payer: Self-pay

## 2018-04-05 DIAGNOSIS — D649 Anemia, unspecified: Secondary | ICD-10-CM

## 2018-04-08 ENCOUNTER — Other Ambulatory Visit: Payer: Self-pay

## 2018-04-08 DIAGNOSIS — D649 Anemia, unspecified: Secondary | ICD-10-CM

## 2018-04-15 ENCOUNTER — Inpatient Hospital Stay: Payer: Medicare Other

## 2018-04-15 ENCOUNTER — Inpatient Hospital Stay (HOSPITAL_BASED_OUTPATIENT_CLINIC_OR_DEPARTMENT_OTHER): Payer: Medicare Other | Admitting: Hematology and Oncology

## 2018-04-15 ENCOUNTER — Other Ambulatory Visit: Payer: Self-pay | Admitting: Hematology and Oncology

## 2018-04-15 ENCOUNTER — Encounter: Payer: Self-pay | Admitting: Hematology and Oncology

## 2018-04-15 VITALS — BP 150/69 | HR 65 | Temp 98.0°F | Resp 16 | Wt 185.1 lb

## 2018-04-15 DIAGNOSIS — N189 Chronic kidney disease, unspecified: Secondary | ICD-10-CM

## 2018-04-15 DIAGNOSIS — E538 Deficiency of other specified B group vitamins: Secondary | ICD-10-CM

## 2018-04-15 DIAGNOSIS — D509 Iron deficiency anemia, unspecified: Secondary | ICD-10-CM

## 2018-04-15 DIAGNOSIS — D631 Anemia in chronic kidney disease: Secondary | ICD-10-CM | POA: Diagnosis not present

## 2018-04-15 DIAGNOSIS — D649 Anemia, unspecified: Secondary | ICD-10-CM

## 2018-04-15 LAB — CBC WITH DIFFERENTIAL/PLATELET
Abs Immature Granulocytes: 0.06 10*3/uL (ref 0.00–0.07)
Basophils Absolute: 0 10*3/uL (ref 0.0–0.1)
Basophils Relative: 0 %
Eosinophils Absolute: 0.2 10*3/uL (ref 0.0–0.5)
Eosinophils Relative: 2 %
HCT: 27.7 % — ABNORMAL LOW (ref 39.0–52.0)
Hemoglobin: 8.9 g/dL — ABNORMAL LOW (ref 13.0–17.0)
Immature Granulocytes: 1 %
Lymphocytes Relative: 13 %
Lymphs Abs: 1.6 10*3/uL (ref 0.7–4.0)
MCH: 27 pg (ref 26.0–34.0)
MCHC: 32.1 g/dL (ref 30.0–36.0)
MCV: 83.9 fL (ref 80.0–100.0)
Monocytes Absolute: 0.8 10*3/uL (ref 0.1–1.0)
Monocytes Relative: 6 %
Neutro Abs: 10.1 10*3/uL — ABNORMAL HIGH (ref 1.7–7.7)
Neutrophils Relative %: 78 %
Platelets: 293 10*3/uL (ref 150–400)
RBC: 3.3 MIL/uL — ABNORMAL LOW (ref 4.22–5.81)
RDW: 15.2 % (ref 11.5–15.5)
WBC: 12.8 10*3/uL — ABNORMAL HIGH (ref 4.0–10.5)
nRBC: 0 % (ref 0.0–0.2)

## 2018-04-15 LAB — COMPREHENSIVE METABOLIC PANEL
ALT: 12 U/L (ref 0–44)
AST: 18 U/L (ref 15–41)
Albumin: 4.6 g/dL (ref 3.5–5.0)
Alkaline Phosphatase: 67 U/L (ref 38–126)
Anion gap: 11 (ref 5–15)
BUN: 29 mg/dL — ABNORMAL HIGH (ref 8–23)
CO2: 24 mmol/L (ref 22–32)
Calcium: 9.2 mg/dL (ref 8.9–10.3)
Chloride: 101 mmol/L (ref 98–111)
Creatinine, Ser: 1.5 mg/dL — ABNORMAL HIGH (ref 0.61–1.24)
GFR calc Af Amer: 50 mL/min — ABNORMAL LOW (ref >60)
GFR calc non Af Amer: 43 mL/min — ABNORMAL LOW (ref >60)
Glucose, Bld: 208 mg/dL — ABNORMAL HIGH (ref 70–99)
Potassium: 3.7 mmol/L (ref 3.5–5.1)
Sodium: 136 mmol/L (ref 135–145)
Total Bilirubin: 0.2 mg/dL — ABNORMAL LOW (ref 0.3–1.2)
Total Protein: 8.7 g/dL — ABNORMAL HIGH (ref 6.5–8.1)

## 2018-04-15 LAB — URINALYSIS, COMPLETE (UACMP) WITH MICROSCOPIC
Bilirubin Urine: NEGATIVE
Glucose, UA: 100 mg/dL — AB
Ketones, ur: NEGATIVE mg/dL
Nitrite: NEGATIVE
Protein, ur: 30 mg/dL — AB
Specific Gravity, Urine: 1.015 (ref 1.005–1.030)
pH: 5.5 (ref 5.0–8.0)

## 2018-04-15 MED ORDER — IRON-VITAMIN C 65-125 MG PO TABS: 1.0000 | ORAL_TABLET | Freq: Two times a day (BID) | ORAL | 1 refills | Status: DC

## 2018-04-15 NOTE — Progress Notes (Signed)
Gregory Tran Clinic day:  04/15/2018  Chief Complaint: Gregory Tran is a 83 y.o. male with anemia of chronic kidney disease who is seen for review of work-up and consideration of initiation of Procrit.  HPI:  The patient was last seen in the hematology clinic on 04/01/2018.  He noted anemia for "awhile".  Prior evaluation revealed a poorly defined band of restricted protein mobility in the gamma globulins.  B12 was borderline in 04/2017.  At that time, he felt fatigued.  Appetite was poor.  We discussed a work-up and consideration of Procrit.  Work-up revealed a hematocrit of 29.2, hemoglobin 9.3, MCV 85.1, platelets 287,000, WBC 6600 with an ANC of 3500.  SPEP revealed a pattern suggestive of subacute inflammatory response.  Kappa free light chains were 52.5, lambda free light chains were 25.0 with a ratio of 2.10 (0.26-1.65).  Epo level was 52.5.  TSH was 1.396. Folate was 27.  B12 was 231.  Ferritin was 13.  Iron saturation was 10% with a TIBC of 442.  Retic was 2.1%.  He was contacted regarding initiation of oral B12 on 04/02/2018. Patient notes that the B12 causes him to have difficulty sleeping, joint and muscle pain, and feelings of being "jittery". Patient had only been taking the supplement for 2 or 3 days.   During the interim, patient doing as "well as expected". Patient presents to clinic from physical therapy. He notes that he is not progressing as he had hoped after "months and months" of therapy. Patient is being referred for water aerobics. Patient denies that he has experienced any B symptoms. He denies any interval infections. Patient denies bleeding; no hematochezia, melena, or gross hematuria.   Patient advises that he maintains an adequate appetite. He is eating well. Weight today is 185 lb 1.2 oz (84 kg), which compared to his last visit to the clinic, represents a stable weight.   Patient denies pain in the clinic today.   Past  Medical History:  Diagnosis Date  . Arthritis   . BPH (benign prostatic hyperplasia)   . Cancer (HCC)    Basal Cell  . Chronic kidney disease   . Coronary artery disease   . Diabetes mellitus ([place])   . Ejaculatory disorder   . Erectile dysfunction   . Frequency   . GERD (gastroesophageal reflux disease)   . Gross hematuria   . Heart disease   . Hematuria   . HTN (hypertension)   . Hyperlipidemia   . Incomplete bladder emptying   . Microscopic hematuria   . Myocardial infarction ([place])   . Neuropathy   . Nocturia   . Rotator cuff tear left  . TIA (transient ischemic attack)   . Tick bite of multiple sites 5 days ago   chest, and groin area    Past Surgical History:  Procedure Laterality Date  . cardiac stents    . CORONARY ANGIOPLASTY  2001, 2008   [facility]  . DUPUYTREN CONTRACTURE RELEASE Right 04/30/2016   Procedure: DUPUYTREN CONTRACTURE RELEASE;  Surgeon: Leanor Kail, MD;  Location: [facility] ORS;  Service: Orthopedics;  Laterality: Right;  . KYPHOPLASTY N/A 01/26/2018   Procedure: Elwanda [place];  Surgeon: Hessie Knows, MD;  Location: [facility] ORS;  Service: Orthopedics;  Laterality: N/A;  . ROTATOR CUFF REPAIR Bilateral   . TRANSURETHRAL RESECTION OF PROSTATE N/A 07/09/2015   Procedure: TRANSURETHRAL RESECTION OF THE PROSTATE (TURP);  Surgeon: Hollice Espy, MD;  Location: [facility] ORS;  Service: Urology;  Laterality: N/A;    Family History  Problem Relation Age of Onset  . Ovarian cancer Sister   . Kidney disease Brother        born one kidney  . Bladder Cancer Neg Hx   . Prostate cancer Neg Hx     Social History:  reports that he quit smoking about 9 months ago. His smoking use included cigarettes. He has a 50.00 pack-year smoking history. He has never used smokeless tobacco. He reports that he does not drink alcohol or use drugs.  His significant other is Gregory Tran.  He lives in [place].  He is alone today.  Allergies:  Allergies  Allergen Reactions  .  Atorvastatin Other (See Comments)  . B Complex Formula 1 Hives    Current Medications: Current Outpatient Medications  Medication Sig Dispense Refill  . acetaminophen (TYLENOL) 650 MG CR tablet Take 650 mg by mouth every 8 (eight) hours as needed for pain.    Marland Kitchen amLODipine (NORVASC) 5 MG tablet TAKE 1 TABLET BY MOUTH DAILY  4  . aspirin EC 81 MG tablet Take 81 mg by mouth daily.     . chlorthalidone (HYGROTON) 50 MG tablet Take 1 tablet by mouth daily.    Marland Kitchen docusate sodium (COLACE) 250 MG capsule Take 1 tablet by mouth 2 (two) times daily.    Marland Kitchen glucose blood (ONE TOUCH ULTRA TEST) test strip U 1 STRIP VIA METER 2 TO 3 XD UTD IN A ROTATING PATTERN    . hydrOXYzine (ATARAX/VISTARIL) 50 MG tablet Take 1 tablet by mouth 2 (two) times daily.    . Insulin Pen Needle (FIFTY50 PEN NEEDLES) 32G X 4 MM MISC by Does not apply route.    Marland Kitchen LEVEMIR FLEXTOUCH 100 UNIT/ML Pen 55 Units.   3  . metFORMIN (GLUCOPHAGE) 1000 MG tablet Take 1,000 mg by mouth 2 (two) times daily with a meal.     . metoprolol succinate (TOPROL-XL) 50 MG 24 hr tablet Take 25 mg by mouth daily.     Marland Kitchen NOVOLOG FLEXPEN 100 UNIT/ML FlexPen Inject 22 Units into the skin 3 (three) times daily with meals.   3  . pantoprazole (PROTONIX) 40 MG tablet Take 40 mg by mouth daily.     . rosuvastatin (CRESTOR) 5 MG tablet Take 5 mg by mouth every other day.   1  . Selenium 100 MCG TABS Take 1 tablet by mouth daily.     . tamsulosin (FLOMAX) 0.4 MG CAPS capsule TAKE 1 CAPSULE(0.4 MG) BY MOUTH DAILY 30 capsule 3  . triamcinolone cream (KENALOG) 0.5 % Apply topically.     No current facility-administered medications for this visit.     Review of Systems:  GENERAL:  Fatigue.  No fevers, sweats or weight loss.  Weight stable. PERFORMANCE STATUS (ECOG):  1 HEENT:  Needs cataract surgery.  Allergies.  No sore throat, mouth sores or tenderness. Lungs: No shortness of breath or cough.  No hemoptysis. Cardiac:  No chest pain, palpitations, orthopnea,  or PND. GI:  50% appetite ("food doesn't taste good").  Constipation.  No nausea, vomiting, diarrhea, melena or hematochezia. GU:  No urgency, frequency, dysuria, or hematuria. Musculoskeletal:  Not doing well in PT.  Compression fracture L4-L5 s/p kyphoplasty.  s/p 14 surgeries to right foot.  No muscle tenderness. Extremities:  No pain or swelling. Skin:  No rashes or skin changes. Neuro:  No headache, numbness or weakness, balance or coordination issues. Endocrine:  Diabetes.  No thyroid issues,  hot flashes or night sweats. Psych:  No mood changes, depression or anxiety.  Poor sleep. Pain:  No focal pain. Review of systems:  All other systems reviewed and found to be negative.   Physical Exam: Blood pressure (!) 150/69, pulse 65, temperature 98 F (36.7 C), temperature source Oral, resp. rate 16, weight 185 lb 1.2 oz (84 kg), SpO2 97 %. GENERAL:  Well developed, well nourished, gentleman sitting comfortably in the exam room in no acute distress. MENTAL STATUS:  Alert and oriented to person, place and time. HEAD:  White hair.  Male pattern baldness.  Normocephalic, atraumatic, face symmetric, no Cushingoid features. EYES:  Glasses.  Blue eyes.  No conjunctivitis or scleral icterus. NEUROLOGICAL: Unremarkable. PSYCH:  Appropriate.    Appointment on 04/15/2018  Component Date Value Ref Range Status  . Sodium 04/15/2018 136  135 - 145 mmol/L Final  . Potassium 04/15/2018 3.7  3.5 - 5.1 mmol/L Final  . Chloride 04/15/2018 101  98 - 111 mmol/L Final  . CO2 04/15/2018 24  22 - 32 mmol/L Final  . Glucose, Bld 04/15/2018 208* 70 - 99 mg/dL Final  . BUN 04/15/2018 29* 8 - 23 mg/dL Final  . Creatinine, Ser 04/15/2018 1.50* 0.61 - 1.24 mg/dL Final  . Calcium 04/15/2018 9.2  8.9 - 10.3 mg/dL Final  . Total Protein 04/15/2018 8.7* 6.5 - 8.1 g/dL Final  . Albumin 04/15/2018 4.6  3.5 - 5.0 g/dL Final  . AST 04/15/2018 18  15 - 41 U/L Final  . ALT 04/15/2018 12  0 - 44 U/L Final  . Alkaline  Phosphatase 04/15/2018 67  38 - 126 U/L Final  . Total Bilirubin 04/15/2018 0.2* 0.3 - 1.2 mg/dL Final  . GFR calc non Af Amer 04/15/2018 43* >60 mL/min Final  . GFR calc Af Amer 04/15/2018 50* >60 mL/min Final  . Anion gap 04/15/2018 11  5 - 15 Final   Performed at [facility], [address] [place], [place] [address]  . WBC 04/15/2018 12.8* 4.0 - 10.5 K/uL Final  . RBC 04/15/2018 3.30* 4.22 - 5.81 MIL/uL Final  . Hemoglobin 04/15/2018 8.9* 13.0 - 17.0 g/dL Final  . HCT 04/15/2018 27.7* 39.0 - 52.0 % Final  . MCV 04/15/2018 83.9  80.0 - 100.0 fL Final  . MCH 04/15/2018 27.0  26.0 - 34.0 pg Final  . MCHC 04/15/2018 32.1  30.0 - 36.0 g/dL Final  . RDW 04/15/2018 15.2  11.5 - 15.5 % Final  . Platelets 04/15/2018 293  150 - 400 K/uL Final  . nRBC 04/15/2018 0.0  0.0 - 0.2 % Final  . Neutrophils Relative % 04/15/2018 78  % Final  . Neutro Abs 04/15/2018 10.1* 1.7 - 7.7 K/uL Final  . Lymphocytes Relative 04/15/2018 13  % Final  . Lymphs Abs 04/15/2018 1.6  0.7 - 4.0 K/uL Final  . Monocytes Relative 04/15/2018 6  % Final  . Monocytes Absolute 04/15/2018 0.8  0.1 - 1.0 K/uL Final  . Eosinophils Relative 04/15/2018 2  % Final  . Eosinophils Absolute 04/15/2018 0.2  0.0 - 0.5 K/uL Final  . Basophils Relative 04/15/2018 0  % Final  . Basophils Absolute 04/15/2018 0.0  0.0 - 0.1 K/uL Final  . Immature Granulocytes 04/15/2018 1  % Final  . Abs Immature Granulocytes 04/15/2018 0.06  0.00 - 0.07 K/uL Final   Performed at [facility], [address] [place], [place] [address]    Assessment:  Gregory Tran is  a 83 y.o. male with a normocytic anemia secondary to iron deficiency, B12 deficiency, and anemia of chronic renal disease.    Work-up on 04/01/2018 revealed a hematocrit of 29.2, hemoglobin 9.3, MCV 85.1, platelets 287,000, WBC 6600 with an ANC of 3500.  Normal studies included:  SPEP, folate, and TSH.  Free light chain ratio was 2.10 (0.26-1.65).  Epo level  was 52.5.  B12 was 231.  Ferritin was 13.  Iron saturation was 10% with a TIBC of 442.  Retic was 2.1%.  He has B12 deficiency.  B12 was 231 on 04/01/2018.  He tried oral B12 x 2-3 days but discontinued as it made him jittery.  He has iron deficiency.  Ferritin was 13 on 04/01/2018.  Diet is poor.  He denies any bleeding.  He has ice pica.  Symptomatically, he remains fatigued.  He denies any bleeding.  Hemoglobin is 8.9.  Plan: 1.   Review work-up. 2.   Iron deficiency anemia  Etiology likely secondary to poor diet.  Patient denies melena or hematochezia.   Guaiac cards x 3.  Check urinalysis- r/o hematuria.  Discuss oral versus IV iron.   Discuss trial of oral iron.   Rx:  Vitron C BID. 3.   B12 deficiency  B12 was 231 on 04/01/2018.  Patient took B12 only for 2-3 days as it made him jittery (? additives).  Patient not interested in B12 supplementation, but would like to eat foods rich in B12. 4.   Poorly defined band of restricted protein, resolved  Normal SPEP and free light chain assay. 5.   Anemia of chronic renal disease  Discuss correcting iron deficiency anemia and B12 deficiency prior to initiation of Retacrit. 6.   RTC in 1 month for MD assessment, labs (CBC with diff, ferritin - day before), and +/- Venofer   Honor Loh, NP  04/15/2018, 2:12 PM   I saw and evaluated the patient, participating in the key portions of the service and reviewing pertinent diagnostic studies and records.  I reviewed the nurse practitioner's note and agree with the findings and the plan.  The assessment and plan were discussed with the patient.  Several questions were asked by the patient and answered.   Nolon Stalls, MD 04/15/2018,2:12 PM

## 2018-04-15 NOTE — Progress Notes (Signed)
Pt here for follow up. Reports he started taking Vitamin B12 after last visit as directed but has since stopped d/t experiencing muscle cramping and jitteriness. Reports symptoms have subsided since stopping.

## 2018-04-16 DIAGNOSIS — D631 Anemia in chronic kidney disease: Secondary | ICD-10-CM | POA: Diagnosis not present

## 2018-04-17 DIAGNOSIS — D631 Anemia in chronic kidney disease: Secondary | ICD-10-CM | POA: Diagnosis not present

## 2018-04-19 ENCOUNTER — Other Ambulatory Visit: Payer: Self-pay

## 2018-04-19 DIAGNOSIS — D509 Iron deficiency anemia, unspecified: Secondary | ICD-10-CM

## 2018-04-19 LAB — OCCULT BLOOD X 1 CARD TO LAB, STOOL
Fecal Occult Bld: NEGATIVE
Fecal Occult Bld: NEGATIVE
Fecal Occult Bld: NEGATIVE

## 2018-04-27 ENCOUNTER — Inpatient Hospital Stay: Payer: Medicare Other | Attending: Hematology and Oncology

## 2018-04-27 DIAGNOSIS — D649 Anemia, unspecified: Secondary | ICD-10-CM

## 2018-04-27 DIAGNOSIS — N189 Chronic kidney disease, unspecified: Secondary | ICD-10-CM | POA: Diagnosis present

## 2018-04-27 DIAGNOSIS — D631 Anemia in chronic kidney disease: Secondary | ICD-10-CM

## 2018-04-27 DIAGNOSIS — D509 Iron deficiency anemia, unspecified: Secondary | ICD-10-CM | POA: Diagnosis not present

## 2018-04-27 DIAGNOSIS — E538 Deficiency of other specified B group vitamins: Secondary | ICD-10-CM | POA: Diagnosis not present

## 2018-04-27 LAB — CBC WITH DIFFERENTIAL/PLATELET
Abs Immature Granulocytes: 0.08 10*3/uL — ABNORMAL HIGH (ref 0.00–0.07)
Basophils Absolute: 0.1 10*3/uL (ref 0.0–0.1)
Basophils Relative: 1 %
Eosinophils Absolute: 0.4 10*3/uL (ref 0.0–0.5)
Eosinophils Relative: 7 %
HCT: 27.7 % — ABNORMAL LOW (ref 39.0–52.0)
Hemoglobin: 8.7 g/dL — ABNORMAL LOW (ref 13.0–17.0)
Immature Granulocytes: 1 %
Lymphocytes Relative: 26 %
Lymphs Abs: 1.5 10*3/uL (ref 0.7–4.0)
MCH: 26.7 pg (ref 26.0–34.0)
MCHC: 31.4 g/dL (ref 30.0–36.0)
MCV: 85 fL (ref 80.0–100.0)
Monocytes Absolute: 0.5 10*3/uL (ref 0.1–1.0)
Monocytes Relative: 9 %
Neutro Abs: 3.1 10*3/uL (ref 1.7–7.7)
Neutrophils Relative %: 56 %
Platelets: 267 10*3/uL (ref 150–400)
RBC: 3.26 MIL/uL — ABNORMAL LOW (ref 4.22–5.81)
RDW: 15.9 % — ABNORMAL HIGH (ref 11.5–15.5)
WBC: 5.6 10*3/uL (ref 4.0–10.5)
nRBC: 0 % (ref 0.0–0.2)

## 2018-04-27 LAB — VITAMIN B12: Vitamin B-12: 302 pg/mL (ref 180–914)

## 2018-05-04 ENCOUNTER — Other Ambulatory Visit: Payer: Medicare Other

## 2018-05-10 ENCOUNTER — Telehealth: Payer: Self-pay

## 2018-05-10 ENCOUNTER — Other Ambulatory Visit: Payer: Self-pay

## 2018-05-10 NOTE — Telephone Encounter (Signed)
-----   Message from Lequita Asal, MD sent at 05/10/2018 12:11 PM EDT ----- Regarding: Please call patient  B12 level is still low.  M ----- Message ----- From: Buel Ream, [facility] [place] Sent: 04/27/2018  10:34 AM EDT To: Lequita Asal, MD

## 2018-05-10 NOTE — Telephone Encounter (Signed)
Spoke with Ms Gregory Tran to inform her that [name] levels are still low. Ms Gregory Tran states he has not been taking any oral Tran. Ms Gregory Tran states she is headed out now and will stop by and get the oral Tran 1000 mcg daily. Ms Gregory Tran was understanding and agreeable to get [name] the oral Tran.

## 2018-05-12 ENCOUNTER — Other Ambulatory Visit: Payer: Medicare Other

## 2018-05-13 ENCOUNTER — Ambulatory Visit: Payer: Medicare Other

## 2018-05-13 ENCOUNTER — Ambulatory Visit: Payer: Medicare Other | Admitting: Hematology and Oncology

## 2018-06-09 ENCOUNTER — Ambulatory Visit: Admission: RE | Admit: 2018-06-09 | Payer: Medicare Other | Source: Home / Self Care | Admitting: Ophthalmology

## 2018-06-09 ENCOUNTER — Encounter: Admission: RE | Payer: Self-pay | Source: Home / Self Care

## 2018-06-09 SURGERY — PHACOEMULSIFICATION, CATARACT, WITH IOL INSERTION
Anesthesia: Choice | Laterality: Left

## 2018-06-17 ENCOUNTER — Other Ambulatory Visit: Payer: Self-pay

## 2018-06-17 DIAGNOSIS — D649 Anemia, unspecified: Secondary | ICD-10-CM

## 2018-06-22 ENCOUNTER — Other Ambulatory Visit: Payer: Self-pay

## 2018-06-23 ENCOUNTER — Other Ambulatory Visit: Payer: Self-pay

## 2018-06-23 ENCOUNTER — Inpatient Hospital Stay: Payer: Medicare Other | Attending: Hematology and Oncology

## 2018-06-23 DIAGNOSIS — Z79899 Other long term (current) drug therapy: Secondary | ICD-10-CM | POA: Insufficient documentation

## 2018-06-23 DIAGNOSIS — N189 Chronic kidney disease, unspecified: Secondary | ICD-10-CM | POA: Insufficient documentation

## 2018-06-23 DIAGNOSIS — K219 Gastro-esophageal reflux disease without esophagitis: Secondary | ICD-10-CM | POA: Insufficient documentation

## 2018-06-23 DIAGNOSIS — Z794 Long term (current) use of insulin: Secondary | ICD-10-CM | POA: Diagnosis not present

## 2018-06-23 DIAGNOSIS — I252 Old myocardial infarction: Secondary | ICD-10-CM | POA: Diagnosis not present

## 2018-06-23 DIAGNOSIS — K5900 Constipation, unspecified: Secondary | ICD-10-CM | POA: Diagnosis not present

## 2018-06-23 DIAGNOSIS — Z7982 Long term (current) use of aspirin: Secondary | ICD-10-CM | POA: Diagnosis not present

## 2018-06-23 DIAGNOSIS — D509 Iron deficiency anemia, unspecified: Secondary | ICD-10-CM | POA: Diagnosis present

## 2018-06-23 DIAGNOSIS — Z8673 Personal history of transient ischemic attack (TIA), and cerebral infarction without residual deficits: Secondary | ICD-10-CM | POA: Diagnosis not present

## 2018-06-23 DIAGNOSIS — D649 Anemia, unspecified: Secondary | ICD-10-CM

## 2018-06-23 DIAGNOSIS — Z7984 Long term (current) use of oral hypoglycemic drugs: Secondary | ICD-10-CM | POA: Insufficient documentation

## 2018-06-23 DIAGNOSIS — Z87891 Personal history of nicotine dependence: Secondary | ICD-10-CM | POA: Insufficient documentation

## 2018-06-23 DIAGNOSIS — E1122 Type 2 diabetes mellitus with diabetic chronic kidney disease: Secondary | ICD-10-CM | POA: Diagnosis not present

## 2018-06-23 DIAGNOSIS — R5383 Other fatigue: Secondary | ICD-10-CM | POA: Diagnosis not present

## 2018-06-23 DIAGNOSIS — E785 Hyperlipidemia, unspecified: Secondary | ICD-10-CM | POA: Insufficient documentation

## 2018-06-23 DIAGNOSIS — D631 Anemia in chronic kidney disease: Secondary | ICD-10-CM | POA: Insufficient documentation

## 2018-06-23 DIAGNOSIS — E538 Deficiency of other specified B group vitamins: Secondary | ICD-10-CM | POA: Diagnosis not present

## 2018-06-23 DIAGNOSIS — I129 Hypertensive chronic kidney disease with stage 1 through stage 4 chronic kidney disease, or unspecified chronic kidney disease: Secondary | ICD-10-CM | POA: Insufficient documentation

## 2018-06-23 LAB — CBC WITH DIFFERENTIAL/PLATELET
Abs Immature Granulocytes: 0.04 10*3/uL (ref 0.00–0.07)
Basophils Absolute: 0 10*3/uL (ref 0.0–0.1)
Basophils Relative: 1 %
Eosinophils Absolute: 0.3 10*3/uL (ref 0.0–0.5)
Eosinophils Relative: 6 %
HCT: 30.3 % — ABNORMAL LOW (ref 39.0–52.0)
Hemoglobin: 10.1 g/dL — ABNORMAL LOW (ref 13.0–17.0)
Immature Granulocytes: 1 %
Lymphocytes Relative: 27 %
Lymphs Abs: 1.5 10*3/uL (ref 0.7–4.0)
MCH: 28.8 pg (ref 26.0–34.0)
MCHC: 33.3 g/dL (ref 30.0–36.0)
MCV: 86.3 fL (ref 80.0–100.0)
Monocytes Absolute: 0.6 10*3/uL (ref 0.1–1.0)
Monocytes Relative: 10 %
Neutro Abs: 3.1 10*3/uL (ref 1.7–7.7)
Neutrophils Relative %: 55 %
Platelets: 266 10*3/uL (ref 150–400)
RBC: 3.51 MIL/uL — ABNORMAL LOW (ref 4.22–5.81)
RDW: 16 % — ABNORMAL HIGH (ref 11.5–15.5)
WBC: 5.6 10*3/uL (ref 4.0–10.5)
nRBC: 0 % (ref 0.0–0.2)

## 2018-06-23 LAB — FERRITIN: Ferritin: 12 ng/mL — ABNORMAL LOW (ref 24–336)

## 2018-06-23 NOTE — Progress Notes (Signed)
[facility]  [address], [address] [place], [place] [address] Phone: [phone]  Fax: [phone]   Clinic Day:  06/24/2018  Referring physician: Sallee Lange, *  Chief Complaint: Gregory Tran is a 83 y.o. male with iron deficiency anemia, B12 deficiency, and anemia of chronic kidney disease who is seen for 2 month assessment.  HPI: The patient was last seen in the hematology clinic on 04/15/2018. At that time, he remained fatigued.  He denied any bleeding.  Ferritin was 13.  B12 was 231.  We discussed a trial of oral iron.  He began Vitron C BID.  We discussed dietary B12 as oral B12 made him jittery.  Guaiac cards were negative x 3 (04/16/2018, 04/17/2018, and 04/18/2018).  Urinalysis revealed trace Hgb.   CBCs followed: 04/15/2018:  Hemoglobin 8.9, hematocrit 27.7.  B12 was 231 on 04/01/2018.   04/27/2018:  Hemoglobin 8.7, hematocrit 27.7.  B12 was 302.  06/23/2018:  Hemoglobin 10.1, hematocrit 30.3.  Ferritin was 12.  He was on B12 for 2-3 days but discontinued secondary to side effects.  He restarted oral B12 on 05/10/2018.  During the interim, he is doing okay. He is not a great historian. He reports his B12 prescription was out of stock after he filled it once. He takes iron-vitamin C tablets BID. He reports constipation.   He reports he is sleeping 14 hrs/day. He wakes up at 7:30/8am and takes a morning nap. He is fatigued and "has no ambition." He is unable to do the things he wants to do in a given day.   He has not been able to do physical therapy, and reports difficulty standing up. He has tightness in his leg muscles.   He states his appetite is good, although food doesn't taste as good. Weight is stable. He reports ice pica.   He reports two sisters had pernicious anemia.    Past Medical History:  Diagnosis Date  . Arthritis   . BPH (benign prostatic hyperplasia)   . Cancer (HCC)    Basal Cell  . Chronic kidney disease    . Coronary artery disease   . Diabetes mellitus ([place])   . Ejaculatory disorder   . Erectile dysfunction   . Frequency   . GERD (gastroesophageal reflux disease)   . Gross hematuria   . Heart disease   . Hematuria   . HTN (hypertension)   . Hyperlipidemia   . Incomplete bladder emptying   . Microscopic hematuria   . Myocardial infarction ([place])   . Neuropathy   . Nocturia   . Rotator cuff tear left  . TIA (transient ischemic attack)   . Tick bite of multiple sites 5 days ago   chest, and groin area    Past Surgical History:  Procedure Laterality Date  . cardiac stents    . CORONARY ANGIOPLASTY  2001, 2008   [facility]  . DUPUYTREN CONTRACTURE RELEASE Right 04/30/2016   Procedure: DUPUYTREN CONTRACTURE RELEASE;  Surgeon: Leanor Kail, MD;  Location: [facility] ORS;  Service: Orthopedics;  Laterality: Right;  . KYPHOPLASTY N/A 01/26/2018   Procedure: Elwanda [place];  Surgeon: Hessie Knows, MD;  Location: [facility] ORS;  Service: Orthopedics;  Laterality: N/A;  . ROTATOR CUFF REPAIR Bilateral   . TRANSURETHRAL RESECTION OF PROSTATE N/A 07/09/2015   Procedure: TRANSURETHRAL RESECTION OF THE PROSTATE (TURP);  Surgeon: Hollice Espy, MD;  Location: [facility] ORS;  Service: Urology;  Laterality: N/A;    Family History  Problem Relation Age of Onset  .  Ovarian cancer Sister   . Kidney disease Brother        born one kidney  . Bladder Cancer Neg Hx   . Prostate cancer Neg Hx     Social History:  reports that he quit smoking about a year ago. His smoking use included cigarettes. He has a 50.00 pack-year smoking history. He has never used smokeless tobacco. He reports that he does not drink alcohol or use drugs.  His significant other is Lyndee Leo.  He lives in [place].  He is alone today.   Allergies:  Allergies  Allergen Reactions  . Atorvastatin Other (See Comments)  . B Complex Formula 1 Hives    Current Medications: Current Outpatient Medications  Medication Sig Dispense Refill   . acetaminophen (TYLENOL) 650 MG CR tablet Take 650 mg by mouth every 8 (eight) hours as needed for pain.    Marland Kitchen amLODipine (NORVASC) 5 MG tablet TAKE 1 TABLET BY MOUTH DAILY  4  . aspirin EC 81 MG tablet Take 81 mg by mouth daily.     . chlorthalidone (HYGROTON) 50 MG tablet Take 1 tablet by mouth daily.    Marland Kitchen docusate sodium (COLACE) 250 MG capsule Take 1 tablet by mouth 2 (two) times daily.    Marland Kitchen glucose blood (ONE TOUCH ULTRA TEST) test strip U 1 STRIP VIA METER 2 TO 3 XD UTD IN A ROTATING PATTERN    . hydrOXYzine (ATARAX/VISTARIL) 50 MG tablet Take 1 tablet by mouth 2 (two) times daily.    . Insulin Pen Needle (FIFTY50 PEN NEEDLES) 32G X 4 MM MISC by Does not apply route.    . Iron-Vitamin C 65-125 MG TABS Take 1 tablet by mouth 2 (two) times daily. 60 tablet 1  . LEVEMIR FLEXTOUCH 100 UNIT/ML Pen 55 Units.   3  . metFORMIN (GLUCOPHAGE) 1000 MG tablet Take 1,000 mg by mouth 2 (two) times daily with a meal.     . metoprolol succinate (TOPROL-XL) 50 MG 24 hr tablet Take 25 mg by mouth daily.     Marland Kitchen NOVOLOG FLEXPEN 100 UNIT/ML FlexPen Inject 22 Units into the skin 3 (three) times daily with meals.   3  . pantoprazole (PROTONIX) 40 MG tablet Take 40 mg by mouth daily.     . rosuvastatin (CRESTOR) 5 MG tablet Take 5 mg by mouth every other day.   1  . tamsulosin (FLOMAX) 0.4 MG CAPS capsule TAKE 1 CAPSULE(0.4 MG) BY MOUTH DAILY 30 capsule 3  . Selenium 100 MCG TABS Take 1 tablet by mouth daily.      No current facility-administered medications for this visit.     Review of Systems  Constitutional: Positive for malaise/fatigue. Negative for chills, fever and weight loss (stable).       Low energy levels. "I have no ambition."  HENT: Negative.  Negative for congestion, ear pain, hearing loss, nosebleeds, sinus pain and sore throat.   Eyes: Negative for blurred vision and double vision.       Needs cataract surgery.  Respiratory: Negative.  Negative for cough, hemoptysis, shortness of breath and  wheezing.   Cardiovascular: Negative.  Negative for chest pain, palpitations, orthopnea, leg swelling and PND.  Gastrointestinal: Positive for constipation. Negative for abdominal pain, diarrhea, heartburn, melena, nausea and vomiting.       "My appetite is good. Food doesn't taste like it used to." Ice pica.  Genitourinary: Negative.  Negative for dysuria, frequency, hematuria and urgency.  Musculoskeletal: Positive for myalgias (  bilateral legs). Negative for back pain and joint pain.  Skin: Negative.  Negative for itching and rash.  Neurological: Negative.  Negative for dizziness, tingling, sensory change, focal weakness, weakness and headaches.  Endo/Heme/Allergies: Negative.   Psychiatric/Behavioral: Positive for memory loss ("my brain goes squirrely"). Negative for depression. The patient is not nervous/anxious and does not have insomnia.     Performance status (ECOG):  2  Physical Exam  Constitutional: He is oriented to person, place, and time. He appears well-developed and well-nourished. No distress.  HENT:  Head: Normocephalic and atraumatic.  Mouth/Throat: Oropharynx is clear and moist. No oropharyngeal exudate.  White hair and mustache.  Male pattern baldness. Wearing a mask.  Eyes: Pupils are equal, round, and reactive to light. Conjunctivae are normal. No scleral icterus.  Glasses.  Blue eyes.  Neck: Normal range of motion. Neck supple. No JVD present.  Cardiovascular: Normal rate, regular rhythm and normal heart sounds. Exam reveals no gallop and no friction rub.  No murmur heard. Pulmonary/Chest: Effort normal and breath sounds normal. No respiratory distress. He has no wheezes. He has no rales.  Abdominal: Soft. Bowel sounds are normal. He exhibits no distension and no mass. There is no abdominal tenderness. There is no rebound and no guarding.  Musculoskeletal: Normal range of motion.        General: No tenderness or edema.  Lymphadenopathy:    He has no cervical  adenopathy.    He has no axillary adenopathy.       Right: No supraclavicular adenopathy present.       Left: No supraclavicular adenopathy present.  Neurological: He is alert and oriented to person, place, and time. Gait (due to joint stiffness in legs) abnormal.  Skin: Skin is warm and dry. No rash noted. He is not diaphoretic. No erythema. No pallor.  Psychiatric: He has a normal mood and affect. Judgment and thought content normal.  Nursing note and vitals reviewed.   Appointment on 06/23/2018  Component Date Value Ref Range Status  . Ferritin 06/23/2018 12* 24 - 336 ng/mL Final   Performed at [facility], [address] [place], [place] [address]  . WBC 06/23/2018 5.6  4.0 - 10.5 K/uL Final  . RBC 06/23/2018 3.51* 4.22 - 5.81 MIL/uL Final  . Hemoglobin 06/23/2018 10.1* 13.0 - 17.0 g/dL Final  . HCT 06/23/2018 30.3* 39.0 - 52.0 % Final  . MCV 06/23/2018 86.3  80.0 - 100.0 fL Final  . MCH 06/23/2018 28.8  26.0 - 34.0 pg Final  . MCHC 06/23/2018 33.3  30.0 - 36.0 g/dL Final  . RDW 06/23/2018 16.0* 11.5 - 15.5 % Final  . Platelets 06/23/2018 266  150 - 400 K/uL Final  . nRBC 06/23/2018 0.0  0.0 - 0.2 % Final  . Neutrophils Relative % 06/23/2018 55  % Final  . Neutro Abs 06/23/2018 3.1  1.7 - 7.7 K/uL Final  . Lymphocytes Relative 06/23/2018 27  % Final  . Lymphs Abs 06/23/2018 1.5  0.7 - 4.0 K/uL Final  . Monocytes Relative 06/23/2018 10  % Final  . Monocytes Absolute 06/23/2018 0.6  0.1 - 1.0 K/uL Final  . Eosinophils Relative 06/23/2018 6  % Final  . Eosinophils Absolute 06/23/2018 0.3  0.0 - 0.5 K/uL Final  . Basophils Relative 06/23/2018 1  % Final  . Basophils Absolute 06/23/2018 0.0  0.0 - 0.1 K/uL Final  . Immature Granulocytes 06/23/2018 1  % Final  . Abs Immature Granulocytes 06/23/2018 0.04  0.00 -  0.07 K/uL Final   Performed at [facility], [address], [place], [place] [address]    Assessment:  ISAAH FURRY is a 83 y.o. male with  a normocytic anemia secondary to iron deficiency, B12 deficiency, and anemia of chronic renal disease.    Work-up on 04/01/2018 revealed a hematocrit of 29.2, hemoglobin 9.3, MCV 85.1, platelets 287,000, WBC 6600 with an ANC of 3500.  Normal studies included:  SPEP, folate, and TSH.  Free light chain ratio was 2.10 (0.26-1.65).  Epo level was 52.5.  B12 was 231.  Ferritin was 13.  Iron saturation was 10% with a TIBC of 442.  Retic was 2.1%.  He has B12 deficiency.  B12 was 231 on 04/01/2018 and 302 on 04/27/2018.  He tried oral B12 x 2-3 days but discontinued as it made him jittery.  He has iron deficiency.  Ferritin was 13 on 04/01/2018 and 12 on 06/23/2018.  Diet is poor.  He denies any bleeding.  He has not had a colonoscopy in years.  Stool was guaiac - x 3 in 03/2018.  Urinalysis revealed trace hemoglobin on 04/15/2018.  He has ice pica.  He has chronic renal insufficiency.  Creatinine was 1.5 on 04/15/2018 (CrCl 43 ml/min).  Symptomatically, he remains fatigued.  He has a hard time getting up and feels like he needs to stretch his muscles.  Exam reveals no adenopathy or hepatosplenomegaly.  Plan: 1.   Review labs from 06/23/2018. 2.   Iron deficiency anemia             Etiology felt secondary to poor diet.             He denies any melena or hematochezia.  Guaiac cards x 3 were negative.             Urinalysis revealed trace hemoglobin.   Discuss follow-up with Dr Holley Raring.             Discuss patient's thoughts about continuation of oral versus IV iron.  Potential side effects of IV iron reviewed.  Patient would like IV iron.  Venofer today and weekly x 2. 3.   B12 deficiency             B12 was 231 on 04/01/2018.             Patient took oral B12 briefly.  He states that B12 was "out of stock" at the store.  He denies feeling jittery with B12 in the past (compared to last visit discussions).  Discuss reinstitution of oral B12.    If B12 level does not improve on oral B12, discuss  B12 injections 4.   Anemia of chronic renal disease             Correct underlying iron deficiency anemia and B12 deficiency prior to initiation of Retacrit. 5.   RTC in 5 weeks for MD assessment, labs (CBC with diff, ferritin, B12- day before), and +/- Venofer.  I discussed the assessment and treatment plan with the patient.  The patient was provided an opportunity to ask questions and all were answered.  The patient agreed with the plan and demonstrated an understanding of the instructions.  The patient was advised to call back if the symptoms worsen or if the condition fails to improve as anticipated.  I provided 20 minutes of face-to-face time during this this encounter and > 50% was spent counseling as documented under my assessment and plan.    Melissa C  Mike Gip, MD, PhD    06/24/2018, 2:06 PM  I, Molly Dorshimer, am acting as Education administrator for [employer]. Mike Gip, MD, PhD.  I, Melissa C. Mike Gip, MD, have reviewed the above documentation for accuracy and completeness, and I agree with the above.

## 2018-06-24 ENCOUNTER — Inpatient Hospital Stay (HOSPITAL_BASED_OUTPATIENT_CLINIC_OR_DEPARTMENT_OTHER): Payer: Medicare Other | Admitting: Hematology and Oncology

## 2018-06-24 ENCOUNTER — Encounter: Payer: Self-pay | Admitting: Hematology and Oncology

## 2018-06-24 ENCOUNTER — Inpatient Hospital Stay: Payer: Medicare Other

## 2018-06-24 VITALS — BP 146/77 | HR 54 | Temp 97.0°F | Resp 18

## 2018-06-24 VITALS — BP 150/69 | HR 57 | Temp 97.6°F | Resp 18 | Wt 185.7 lb

## 2018-06-24 DIAGNOSIS — N189 Chronic kidney disease, unspecified: Secondary | ICD-10-CM

## 2018-06-24 DIAGNOSIS — R5383 Other fatigue: Secondary | ICD-10-CM

## 2018-06-24 DIAGNOSIS — E538 Deficiency of other specified B group vitamins: Secondary | ICD-10-CM

## 2018-06-24 DIAGNOSIS — Z8673 Personal history of transient ischemic attack (TIA), and cerebral infarction without residual deficits: Secondary | ICD-10-CM

## 2018-06-24 DIAGNOSIS — D509 Iron deficiency anemia, unspecified: Secondary | ICD-10-CM

## 2018-06-24 DIAGNOSIS — E1122 Type 2 diabetes mellitus with diabetic chronic kidney disease: Secondary | ICD-10-CM

## 2018-06-24 DIAGNOSIS — D631 Anemia in chronic kidney disease: Secondary | ICD-10-CM

## 2018-06-24 DIAGNOSIS — I129 Hypertensive chronic kidney disease with stage 1 through stage 4 chronic kidney disease, or unspecified chronic kidney disease: Secondary | ICD-10-CM

## 2018-06-24 DIAGNOSIS — Z87891 Personal history of nicotine dependence: Secondary | ICD-10-CM

## 2018-06-24 DIAGNOSIS — I252 Old myocardial infarction: Secondary | ICD-10-CM

## 2018-06-24 DIAGNOSIS — K219 Gastro-esophageal reflux disease without esophagitis: Secondary | ICD-10-CM

## 2018-06-24 DIAGNOSIS — Z794 Long term (current) use of insulin: Secondary | ICD-10-CM

## 2018-06-24 DIAGNOSIS — K5900 Constipation, unspecified: Secondary | ICD-10-CM

## 2018-06-24 DIAGNOSIS — Z7984 Long term (current) use of oral hypoglycemic drugs: Secondary | ICD-10-CM

## 2018-06-24 DIAGNOSIS — E785 Hyperlipidemia, unspecified: Secondary | ICD-10-CM

## 2018-06-24 DIAGNOSIS — Z7982 Long term (current) use of aspirin: Secondary | ICD-10-CM

## 2018-06-24 DIAGNOSIS — Z79899 Other long term (current) drug therapy: Secondary | ICD-10-CM

## 2018-06-24 MED ORDER — IRON SUCROSE 20 MG/ML IV SOLN
200.0000 mg | Freq: Once | INTRAVENOUS | Status: AC
  Administered 2018-06-24: 200 mg via INTRAVENOUS

## 2018-06-24 MED ORDER — SODIUM CHLORIDE 0.9 % IV SOLN: 200.0000 mg | Freq: Once | INTRAVENOUS | Status: DC

## 2018-06-24 MED ORDER — SODIUM CHLORIDE 0.9 % IV SOLN
Freq: Once | INTRAVENOUS | Status: AC
  Administered 2018-06-24: 15:00:00 via INTRAVENOUS
  Filled 2018-06-24: qty 250

## 2018-06-24 NOTE — Progress Notes (Signed)
Pt here for follow up. Patient is concerned about Constipation. Reports he was going everyday and is not going EOD. Denies any other concerns at this time.

## 2018-06-24 NOTE — Patient Instructions (Signed)
Iron Sucrose injection What is this medicine? IRON SUCROSE (AHY ern SOO krohs) is an iron complex. Iron is used to make healthy red blood cells, which carry oxygen and nutrients throughout the body. This medicine is used to treat iron deficiency anemia in people with chronic kidney disease. This medicine may be used for other purposes; ask your health care provider or pharmacist if you have questions. COMMON BRAND NAME(S): Venofer What should I tell my health care provider before I take this medicine? They need to know if you have any of these conditions: -anemia not caused by low iron levels -heart disease -high levels of iron in the blood -kidney disease -liver disease -an unusual or allergic reaction to iron, other medicines, foods, dyes, or preservatives -pregnant or trying to get pregnant -breast-feeding How should I use this medicine? This medicine is for infusion into a vein. It is given by a health care professional in a hospital or clinic setting. Talk to your pediatrician regarding the use of this medicine in children. While this drug may be prescribed for children as young as 2 years for selected conditions, precautions do apply. Overdosage: If you think you have taken too much of this medicine contact a poison control center or emergency room at once. NOTE: This medicine is only for you. Do not share this medicine with others. What if I miss a dose? It is important not to miss your dose. Call your doctor or health care professional if you are unable to keep an appointment. What may interact with this medicine? Do not take this medicine with any of the following medications: -deferoxamine -dimercaprol -other iron products This medicine may also interact with the following medications: -chloramphenicol -deferasirox This list may not describe all possible interactions. Give your health care provider a list of all the medicines, herbs, non-prescription drugs, or dietary  supplements you use. Also tell them if you smoke, drink alcohol, or use illegal drugs. Some items may interact with your medicine. What should I watch for while using this medicine? Visit your doctor or healthcare professional regularly. Tell your doctor or healthcare professional if your symptoms do not start to get better or if they get worse. You may need blood work done while you are taking this medicine. You may need to follow a special diet. Talk to your doctor. Foods that contain iron include: whole grains/cereals, dried fruits, beans, or peas, leafy green vegetables, and organ meats (liver, kidney). What side effects may I notice from receiving this medicine? Side effects that you should report to your doctor or health care professional as soon as possible: -allergic reactions like skin rash, itching or hives, swelling of the face, lips, or tongue -breathing problems -changes in blood pressure -cough -fast, irregular heartbeat -feeling faint or lightheaded, falls -fever or chills -flushing, sweating, or hot feelings -joint or muscle aches/pains -seizures -swelling of the ankles or feet -unusually weak or tired Side effects that usually do not require medical attention (report to your doctor or health care professional if they continue or are bothersome): -diarrhea -feeling achy -headache -irritation at site where injected -nausea, vomiting -stomach upset -tiredness This list may not describe all possible side effects. Call your doctor for medical advice about side effects. You may report side effects to FDA at 1-800-FDA-1088. Where should I keep my medicine? This drug is given in a hospital or clinic and will not be stored at home. NOTE: This sheet is a summary. It may not cover all possible information. If   you have questions about this medicine, talk to your doctor, pharmacist, or health care provider.  2019 Elsevier/Gold Standard (2010-11-14 17:14:35)  

## 2018-07-01 ENCOUNTER — Inpatient Hospital Stay: Payer: Medicare Other

## 2018-07-01 ENCOUNTER — Other Ambulatory Visit: Payer: Self-pay

## 2018-07-01 VITALS — BP 162/90 | HR 59 | Temp 97.5°F | Resp 18

## 2018-07-01 DIAGNOSIS — D509 Iron deficiency anemia, unspecified: Secondary | ICD-10-CM | POA: Diagnosis not present

## 2018-07-01 MED ORDER — SODIUM CHLORIDE 0.9 % IV SOLN: 200.0000 mg | Freq: Once | INTRAVENOUS | Status: DC

## 2018-07-01 MED ORDER — SODIUM CHLORIDE 0.9 % IV SOLN
Freq: Once | INTRAVENOUS | Status: AC
  Administered 2018-07-01: 14:00:00 via INTRAVENOUS
  Filled 2018-07-01: qty 250

## 2018-07-01 MED ORDER — IRON SUCROSE 20 MG/ML IV SOLN
200.0000 mg | Freq: Once | INTRAVENOUS | Status: AC
  Administered 2018-07-01: 14:00:00 200 mg via INTRAVENOUS

## 2018-07-01 NOTE — Patient Instructions (Signed)
Iron Sucrose injection What is this medicine? IRON SUCROSE (AHY ern SOO krohs) is an iron complex. Iron is used to make healthy red blood cells, which carry oxygen and nutrients throughout the body. This medicine is used to treat iron deficiency anemia in people with chronic kidney disease. This medicine may be used for other purposes; ask your health care provider or pharmacist if you have questions. COMMON BRAND NAME(S): Venofer What should I tell my health care provider before I take this medicine? They need to know if you have any of these conditions: -anemia not caused by low iron levels -heart disease -high levels of iron in the blood -kidney disease -liver disease -an unusual or allergic reaction to iron, other medicines, foods, dyes, or preservatives -pregnant or trying to get pregnant -breast-feeding How should I use this medicine? This medicine is for infusion into a vein. It is given by a health care professional in a hospital or clinic setting. Talk to your pediatrician regarding the use of this medicine in children. While this drug may be prescribed for children as young as 2 years for selected conditions, precautions do apply. Overdosage: If you think you have taken too much of this medicine contact a poison control center or emergency room at once. NOTE: This medicine is only for you. Do not share this medicine with others. What if I miss a dose? It is important not to miss your dose. Call your doctor or health care professional if you are unable to keep an appointment. What may interact with this medicine? Do not take this medicine with any of the following medications: -deferoxamine -dimercaprol -other iron products This medicine may also interact with the following medications: -chloramphenicol -deferasirox This list may not describe all possible interactions. Give your health care provider a list of all the medicines, herbs, non-prescription drugs, or dietary  supplements you use. Also tell them if you smoke, drink alcohol, or use illegal drugs. Some items may interact with your medicine. What should I watch for while using this medicine? Visit your doctor or healthcare professional regularly. Tell your doctor or healthcare professional if your symptoms do not start to get better or if they get worse. You may need blood work done while you are taking this medicine. You may need to follow a special diet. Talk to your doctor. Foods that contain iron include: whole grains/cereals, dried fruits, beans, or peas, leafy green vegetables, and organ meats (liver, kidney). What side effects may I notice from receiving this medicine? Side effects that you should report to your doctor or health care professional as soon as possible: -allergic reactions like skin rash, itching or hives, swelling of the face, lips, or tongue -breathing problems -changes in blood pressure -cough -fast, irregular heartbeat -feeling faint or lightheaded, falls -fever or chills -flushing, sweating, or hot feelings -joint or muscle aches/pains -seizures -swelling of the ankles or feet -unusually weak or tired Side effects that usually do not require medical attention (report to your doctor or health care professional if they continue or are bothersome): -diarrhea -feeling achy -headache -irritation at site where injected -nausea, vomiting -stomach upset -tiredness This list may not describe all possible side effects. Call your doctor for medical advice about side effects. You may report side effects to FDA at 1-800-FDA-1088. Where should I keep my medicine? This drug is given in a hospital or clinic and will not be stored at home. NOTE: This sheet is a summary. It may not cover all possible information. If   you have questions about this medicine, talk to your doctor, pharmacist, or health care provider.  2019 Elsevier/Gold Standard (2010-11-14 17:14:35)  

## 2018-07-07 ENCOUNTER — Other Ambulatory Visit: Payer: Self-pay

## 2018-07-08 ENCOUNTER — Inpatient Hospital Stay: Payer: Medicare Other

## 2018-07-08 VITALS — BP 142/66 | HR 63 | Temp 96.9°F | Resp 18

## 2018-07-08 DIAGNOSIS — D509 Iron deficiency anemia, unspecified: Secondary | ICD-10-CM

## 2018-07-08 MED ORDER — IRON SUCROSE 20 MG/ML IV SOLN
200.0000 mg | Freq: Once | INTRAVENOUS | Status: AC
  Administered 2018-07-08: 200 mg via INTRAVENOUS
  Filled 2018-07-08: qty 10

## 2018-07-08 MED ORDER — SODIUM CHLORIDE 0.9 % IV SOLN
Freq: Once | INTRAVENOUS | Status: AC
  Administered 2018-07-08: 14:00:00 via INTRAVENOUS
  Filled 2018-07-08: qty 250

## 2018-07-08 MED ORDER — SODIUM CHLORIDE 0.9 % IV SOLN: 200.0000 mg | Freq: Once | INTRAVENOUS | Status: DC

## 2018-07-08 NOTE — Patient Instructions (Signed)
Iron Sucrose injection What is this medicine? IRON SUCROSE (AHY ern SOO krohs) is an iron complex. Iron is used to make healthy red blood cells, which carry oxygen and nutrients throughout the body. This medicine is used to treat iron deficiency anemia in people with chronic kidney disease. This medicine may be used for other purposes; ask your health care provider or pharmacist if you have questions. COMMON BRAND NAME(S): Venofer What should I tell my health care provider before I take this medicine? They need to know if you have any of these conditions: -anemia not caused by low iron levels -heart disease -high levels of iron in the blood -kidney disease -liver disease -an unusual or allergic reaction to iron, other medicines, foods, dyes, or preservatives -pregnant or trying to get pregnant -breast-feeding How should I use this medicine? This medicine is for infusion into a vein. It is given by a health care professional in a hospital or clinic setting. Talk to your pediatrician regarding the use of this medicine in children. While this drug may be prescribed for children as young as 2 years for selected conditions, precautions do apply. Overdosage: If you think you have taken too much of this medicine contact a poison control center or emergency room at once. NOTE: This medicine is only for you. Do not share this medicine with others. What if I miss a dose? It is important not to miss your dose. Call your doctor or health care professional if you are unable to keep an appointment. What may interact with this medicine? Do not take this medicine with any of the following medications: -deferoxamine -dimercaprol -other iron products This medicine may also interact with the following medications: -chloramphenicol -deferasirox This list may not describe all possible interactions. Give your health care provider a list of all the medicines, herbs, non-prescription drugs, or dietary  supplements you use. Also tell them if you smoke, drink alcohol, or use illegal drugs. Some items may interact with your medicine. What should I watch for while using this medicine? Visit your doctor or healthcare professional regularly. Tell your doctor or healthcare professional if your symptoms do not start to get better or if they get worse. You may need blood work done while you are taking this medicine. You may need to follow a special diet. Talk to your doctor. Foods that contain iron include: whole grains/cereals, dried fruits, beans, or peas, leafy green vegetables, and organ meats (liver, kidney). What side effects may I notice from receiving this medicine? Side effects that you should report to your doctor or health care professional as soon as possible: -allergic reactions like skin rash, itching or hives, swelling of the face, lips, or tongue -breathing problems -changes in blood pressure -cough -fast, irregular heartbeat -feeling faint or lightheaded, falls -fever or chills -flushing, sweating, or hot feelings -joint or muscle aches/pains -seizures -swelling of the ankles or feet -unusually weak or tired Side effects that usually do not require medical attention (report to your doctor or health care professional if they continue or are bothersome): -diarrhea -feeling achy -headache -irritation at site where injected -nausea, vomiting -stomach upset -tiredness This list may not describe all possible side effects. Call your doctor for medical advice about side effects. You may report side effects to FDA at 1-800-FDA-1088. Where should I keep my medicine? This drug is given in a hospital or clinic and will not be stored at home. NOTE: This sheet is a summary. It may not cover all possible information. If   you have questions about this medicine, talk to your doctor, pharmacist, or health care provider.  2019 Elsevier/Gold Standard (2010-11-14 17:14:35)  

## 2018-07-27 ENCOUNTER — Telehealth: Payer: Self-pay | Admitting: Hematology and Oncology

## 2018-07-27 ENCOUNTER — Encounter: Payer: Self-pay | Admitting: *Deleted

## 2018-07-27 ENCOUNTER — Other Ambulatory Visit: Payer: Self-pay

## 2018-07-27 NOTE — Telephone Encounter (Signed)
Spoke to pt and completed travel screen. Also explained about addl screening questions they will be asked, new guidelines about mask req, no visitors, and fever checks °

## 2018-07-28 ENCOUNTER — Inpatient Hospital Stay: Payer: Medicare Other | Attending: Hematology and Oncology

## 2018-07-28 DIAGNOSIS — D631 Anemia in chronic kidney disease: Secondary | ICD-10-CM

## 2018-07-28 DIAGNOSIS — D509 Iron deficiency anemia, unspecified: Secondary | ICD-10-CM | POA: Diagnosis not present

## 2018-07-28 DIAGNOSIS — N189 Chronic kidney disease, unspecified: Secondary | ICD-10-CM

## 2018-07-28 DIAGNOSIS — E538 Deficiency of other specified B group vitamins: Secondary | ICD-10-CM | POA: Diagnosis not present

## 2018-07-28 LAB — COMPREHENSIVE METABOLIC PANEL
ALT: 14 U/L (ref 0–44)
AST: 18 U/L (ref 15–41)
Albumin: 4.7 g/dL (ref 3.5–5.0)
Alkaline Phosphatase: 69 U/L (ref 38–126)
Anion gap: 11 (ref 5–15)
BUN: 23 mg/dL (ref 8–23)
CO2: 24 mmol/L (ref 22–32)
Calcium: 9.2 mg/dL (ref 8.9–10.3)
Chloride: 104 mmol/L (ref 98–111)
Creatinine, Ser: 1.41 mg/dL — ABNORMAL HIGH (ref 0.61–1.24)
GFR calc Af Amer: 54 mL/min — ABNORMAL LOW (ref >60)
GFR calc non Af Amer: 46 mL/min — ABNORMAL LOW (ref >60)
Glucose, Bld: 155 mg/dL — ABNORMAL HIGH (ref 70–99)
Potassium: 3.7 mmol/L (ref 3.5–5.1)
Sodium: 139 mmol/L (ref 135–145)
Total Bilirubin: 0.6 mg/dL (ref 0.3–1.2)
Total Protein: 8.5 g/dL — ABNORMAL HIGH (ref 6.5–8.1)

## 2018-07-28 LAB — FERRITIN: Ferritin: 91 ng/mL (ref 24–336)

## 2018-07-29 ENCOUNTER — Other Ambulatory Visit: Payer: Self-pay

## 2018-07-29 ENCOUNTER — Encounter
Admission: RE | Admit: 2018-07-29 | Discharge: 2018-07-29 | Disposition: A | Discharge status: Home / Self Care | Payer: Medicare Other | Source: Ambulatory Visit | Attending: Ophthalmology | Admitting: Ophthalmology

## 2018-07-29 ENCOUNTER — Ambulatory Visit: Payer: Medicare Other

## 2018-07-29 ENCOUNTER — Ambulatory Visit: Payer: Medicare Other | Admitting: Hematology and Oncology

## 2018-07-29 DIAGNOSIS — Z1159 Encounter for screening for other viral diseases: Secondary | ICD-10-CM | POA: Diagnosis not present

## 2018-07-29 LAB — ANTI-PARIETAL ANTIBODY: Parietal Cell Antibody-IgG: 5 Units (ref 0.0–20.0)

## 2018-07-29 LAB — INTRINSIC FACTOR ANTIBODIES: Intrinsic Factor: 1.1 AU/mL (ref 0.0–1.1)

## 2018-07-29 LAB — VITAMIN B12: Vitamin B-12: 436 pg/mL (ref 180–914)

## 2018-07-29 NOTE — Discharge Instructions (Signed)

## 2018-07-30 LAB — NOVEL CORONAVIRUS, NAA (HOSP ORDER, SEND-OUT TO REF LAB; TAT 18-24 HRS): SARS-CoV-2, NAA: NOT DETECTED

## 2018-08-02 ENCOUNTER — Ambulatory Visit
Admission: RE | Admit: 2018-08-02 | Discharge: 2018-08-02 | Disposition: A | Discharge status: Home / Self Care | Payer: Medicare Other | Attending: Ophthalmology | Admitting: Ophthalmology

## 2018-08-02 ENCOUNTER — Ambulatory Visit: Payer: Medicare Other | Admitting: Anesthesiology

## 2018-08-02 ENCOUNTER — Encounter
Admission: RE | Disposition: A | Discharge status: Home / Self Care | Payer: Self-pay | Source: Home / Self Care | Attending: Ophthalmology

## 2018-08-02 DIAGNOSIS — I2510 Atherosclerotic heart disease of native coronary artery without angina pectoris: Secondary | ICD-10-CM | POA: Insufficient documentation

## 2018-08-02 DIAGNOSIS — I252 Old myocardial infarction: Secondary | ICD-10-CM | POA: Insufficient documentation

## 2018-08-02 DIAGNOSIS — I10 Essential (primary) hypertension: Secondary | ICD-10-CM | POA: Insufficient documentation

## 2018-08-02 DIAGNOSIS — E1136 Type 2 diabetes mellitus with diabetic cataract: Secondary | ICD-10-CM | POA: Diagnosis not present

## 2018-08-02 DIAGNOSIS — D649 Anemia, unspecified: Secondary | ICD-10-CM | POA: Diagnosis not present

## 2018-08-02 DIAGNOSIS — Z85828 Personal history of other malignant neoplasm of skin: Secondary | ICD-10-CM | POA: Diagnosis not present

## 2018-08-02 DIAGNOSIS — H9190 Unspecified hearing loss, unspecified ear: Secondary | ICD-10-CM | POA: Insufficient documentation

## 2018-08-02 DIAGNOSIS — Z87891 Personal history of nicotine dependence: Secondary | ICD-10-CM | POA: Diagnosis not present

## 2018-08-02 DIAGNOSIS — E7800 Pure hypercholesterolemia, unspecified: Secondary | ICD-10-CM | POA: Diagnosis not present

## 2018-08-02 DIAGNOSIS — Z79899 Other long term (current) drug therapy: Secondary | ICD-10-CM | POA: Insufficient documentation

## 2018-08-02 DIAGNOSIS — E785 Hyperlipidemia, unspecified: Secondary | ICD-10-CM | POA: Insufficient documentation

## 2018-08-02 DIAGNOSIS — Z794 Long term (current) use of insulin: Secondary | ICD-10-CM | POA: Insufficient documentation

## 2018-08-02 DIAGNOSIS — Z7982 Long term (current) use of aspirin: Secondary | ICD-10-CM | POA: Insufficient documentation

## 2018-08-02 DIAGNOSIS — H2512 Age-related nuclear cataract, left eye: Secondary | ICD-10-CM | POA: Insufficient documentation

## 2018-08-02 DIAGNOSIS — Z8673 Personal history of transient ischemic attack (TIA), and cerebral infarction without residual deficits: Secondary | ICD-10-CM | POA: Diagnosis not present

## 2018-08-02 DIAGNOSIS — K219 Gastro-esophageal reflux disease without esophagitis: Secondary | ICD-10-CM | POA: Diagnosis not present

## 2018-08-02 DIAGNOSIS — Z955 Presence of coronary angioplasty implant and graft: Secondary | ICD-10-CM | POA: Insufficient documentation

## 2018-08-02 HISTORY — DX: Dizziness and giddiness: R42

## 2018-08-02 HISTORY — PX: CATARACT EXTRACTION W/PHACO: SHX586

## 2018-08-02 LAB — GLUCOSE, CAPILLARY: Glucose-Capillary: 161 mg/dL — ABNORMAL HIGH (ref 70–99)

## 2018-08-02 SURGERY — PHACOEMULSIFICATION, CATARACT, WITH IOL INSERTION
Anesthesia: Monitor Anesthesia Care | Site: Eye | Laterality: Left

## 2018-08-02 MED ORDER — SODIUM HYALURONATE 10 MG/ML IO SOLN
INTRAOCULAR | Status: DC | PRN
  Administered 2018-08-02: 0.55 mL via INTRAOCULAR

## 2018-08-02 MED ORDER — FENTANYL CITRATE (PF) 100 MCG/2ML IJ SOLN
INTRAMUSCULAR | Status: DC | PRN
  Administered 2018-08-02: 50 ug via INTRAVENOUS

## 2018-08-02 MED ORDER — ARMC OPHTHALMIC DILATING DROPS
1.0000 "application " | OPHTHALMIC | Status: DC | PRN
  Administered 2018-08-02 (×3): 1 via OPHTHALMIC

## 2018-08-02 MED ORDER — MIDAZOLAM HCL 2 MG/2ML IJ SOLN
INTRAMUSCULAR | Status: DC | PRN
  Administered 2018-08-02: 1 mg via INTRAVENOUS

## 2018-08-02 MED ORDER — EPINEPHRINE PF 1 MG/ML IJ SOLN
INTRAOCULAR | Status: DC | PRN
  Administered 2018-08-02: 12:00:00 121 mL via OPHTHALMIC

## 2018-08-02 MED ORDER — ONDANSETRON HCL 4 MG/2ML IJ SOLN: 4.0000 mg | Freq: Once | INTRAMUSCULAR | Status: DC | PRN

## 2018-08-02 MED ORDER — SODIUM HYALURONATE 23 MG/ML IO SOLN
INTRAOCULAR | Status: DC | PRN
  Administered 2018-08-02: 0.6 mL via INTRAOCULAR

## 2018-08-02 MED ORDER — MOXIFLOXACIN HCL 0.5 % OP SOLN
OPHTHALMIC | Status: DC | PRN
  Administered 2018-08-02: 0.2 mL via OPHTHALMIC

## 2018-08-02 MED ORDER — LIDOCAINE HCL (PF) 2 % IJ SOLN
INTRAOCULAR | Status: DC | PRN
  Administered 2018-08-02: 1 mL via INTRAOCULAR

## 2018-08-02 MED ORDER — TETRACAINE HCL 0.5 % OP SOLN
1.0000 [drp] | OPHTHALMIC | Status: DC | PRN
  Administered 2018-08-02 (×3): 1 [drp] via OPHTHALMIC

## 2018-08-02 SURGICAL SUPPLY — 19 items
CANNULA ANT/CHMB 27G (MISCELLANEOUS) ×2 IMPLANT
CANNULA ANT/CHMB 27GA (MISCELLANEOUS) ×6 IMPLANT
DISSECTOR HYDRO NUCLEUS 50X22 (MISCELLANEOUS) ×3 IMPLANT
GLOVE SURG LX 7.5 STRW (GLOVE) ×4
GLOVE SURG LX STRL 7.5 STRW (GLOVE) ×1 IMPLANT
GLOVE SURG SYN 8.5  E (GLOVE) ×2
GLOVE SURG SYN 8.5 E (GLOVE) ×1 IMPLANT
GLOVE SURG SYN 8.5 PF PI (GLOVE) ×1 IMPLANT
GOWN STRL REUS W/ TWL LRG LVL3 (GOWN DISPOSABLE) ×2 IMPLANT
GOWN STRL REUS W/TWL LRG LVL3 (GOWN DISPOSABLE) ×4
LENS IOL TECNIS ITEC 22.0 (Intraocular Lens) ×2 IMPLANT
MARKER SKIN DUAL TIP RULER LAB (MISCELLANEOUS) ×3 IMPLANT
PACK DR. KING ARMS (PACKS) ×3 IMPLANT
PACK EYE AFTER SURG (MISCELLANEOUS) ×3 IMPLANT
PACK OPTHALMIC (MISCELLANEOUS) ×3 IMPLANT
SYR 3ML LL SCALE MARK (SYRINGE) ×3 IMPLANT
SYR TB 1ML LUER SLIP (SYRINGE) ×3 IMPLANT
WATER STERILE IRR 250ML POUR (IV SOLUTION) ×3 IMPLANT
WIPE NON LINTING 3.25X3.25 (MISCELLANEOUS) ×3 IMPLANT

## 2018-08-02 NOTE — Anesthesia Preprocedure Evaluation (Signed)
Anesthesia Evaluation  Patient identified by MRN, date of birth, ID band Patient awake    Reviewed: Allergy & Precautions, NPO status , Patient's Chart, lab work & pertinent test results  Airway Mallampati: II  TM Distance: >3 FB     Dental   Pulmonary former smoker,    breath sounds clear to auscultation       Cardiovascular hypertension, + CAD, + Past MI and + Cardiac Stents (x 2 (2001, 2008))   Rhythm:Regular Rate:Normal  HLD  TTE 2018 - normal EF   Neuro/Psych TIA   GI/Hepatic GERD  ,  Endo/Other  diabetes, Type 2  Renal/GU Renal InsufficiencyRenal disease     Musculoskeletal  (+) Arthritis ,   Abdominal   Peds  Hematology  (+) anemia ,   Anesthesia Other Findings   Reproductive/Obstetrics                             Anesthesia Physical Anesthesia Plan  ASA: III  Anesthesia Plan: MAC   Post-op Pain Management:    Induction: Intravenous  PONV Risk Score and Plan: Midazolam  Airway Management Planned: Natural Airway and Nasal Cannula  Additional Equipment:   Intra-op Plan:   Post-operative Plan:   Informed Consent: I have reviewed the patients History and Physical, chart, labs and discussed the procedure including the risks, benefits and alternatives for the proposed anesthesia with the patient or authorized representative who has indicated his/her understanding and acceptance.       Plan Discussed with: CRNA  Anesthesia Plan Comments:         Anesthesia Quick Evaluation

## 2018-08-02 NOTE — Anesthesia Postprocedure Evaluation (Signed)
Anesthesia Post Note  Patient: Gregory Tran  Procedure(s) Performed: CATARACT EXTRACTION PHACO AND INTRAOCULAR LENS PLACEMENT (IOC)  LEFT DIABETIC (Left Eye)  Patient location during evaluation: PACU Anesthesia Type: MAC Level of consciousness: awake and alert Pain management: pain level controlled Vital Signs Assessment: post-procedure vital signs reviewed and stable Respiratory status: spontaneous breathing, nonlabored ventilation, respiratory function stable and patient connected to nasal cannula oxygen Cardiovascular status: stable and blood pressure returned to baseline Postop Assessment: no apparent nausea or vomiting Anesthetic complications: no    Veda Canning

## 2018-08-02 NOTE — Anesthesia Procedure Notes (Signed)
Procedure Name: MAC Date/Time: 08/02/2018 11:28 AM Performed by: Cameron Ali, CRNA Pre-anesthesia Checklist: Patient identified, Emergency Drugs available, Suction available, Timeout performed and Patient being monitored Patient Re-evaluated:Patient Re-evaluated prior to induction Oxygen Delivery Method: Nasal cannula Placement Confirmation: positive ETCO2

## 2018-08-02 NOTE — Transfer of Care (Signed)
Immediate Anesthesia Transfer of Care Note  Patient: Gregory Tran  Procedure(s) Performed: CATARACT EXTRACTION PHACO AND INTRAOCULAR LENS PLACEMENT (IOC)  LEFT DIABETIC (Left Eye)  Patient Location: PACU  Anesthesia Type: MAC  Level of Consciousness: awake, alert  and patient cooperative  Airway and Oxygen Therapy: Patient Spontanous Breathing and Patient connected to supplemental oxygen  Post-op Assessment: Post-op Vital signs reviewed, Patient's Cardiovascular Status Stable, Respiratory Function Stable, Patent Airway and No signs of Nausea or vomiting  Post-op Vital Signs: Reviewed and stable  Complications: No apparent anesthesia complications

## 2018-08-02 NOTE — Op Note (Signed)
OPERATIVE NOTE  Gregory Tran [id number] 08/02/2018   PREOPERATIVE DIAGNOSIS:  Nuclear sclerotic cataract left eye.  H25.12   POSTOPERATIVE DIAGNOSIS:    Nuclear sclerotic cataract left eye.     PROCEDURE:  Phacoemusification with posterior chamber intraocular lens placement of the left eye   LENS:   Implant Name Type Inv. Item Serial No. Manufacturer Lot No. LRB No. Used Action  LENS IOL DIOP 22.0 - [id number] Intraocular Lens LENS IOL DIOP 22.0 [id number] AMO  Left 1 Implanted       PCB00 +22.0   ULTRASOUND TIME: 1 minutes 26 seconds.  CDE 9.22   SURGEON:  Benay Pillow, MD, MPH   ANESTHESIA:  Topical with tetracaine drops augmented with 1% preservative-free intracameral lidocaine.  ESTIMATED BLOOD LOSS: <1 mL   COMPLICATIONS:  None.   DESCRIPTION OF PROCEDURE:  The patient was identified in the holding room and transported to the operating room and placed in the supine position under the operating microscope.  The left eye was identified as the operative eye and it was prepped and draped in the usual sterile ophthalmic fashion.   A 1.0 millimeter clear-corneal paracentesis was made at the 5:00 position. 0.5 ml of preservative-free 1% lidocaine with epinephrine was injected into the anterior chamber.  The anterior chamber was filled with Healon 5 viscoelastic.  A 2.4 millimeter keratome was used to make a near-clear corneal incision at the 2:00 position.  A curvilinear capsulorrhexis was made with a cystotome and capsulorrhexis forceps.  Balanced salt solution was used to hydrodissect and hydrodelineate the nucleus.   Phacoemulsification was then used in stop and chop fashion to remove the lens nucleus and epinucleus.  The remaining cortex was then removed using the irrigation and aspiration handpiece. Healon was then placed into the capsular bag to distend it for lens placement.  A lens was then injected into the capsular bag.  The remaining viscoelastic was aspirated.    Wounds were hydrated with balanced salt solution.  The anterior chamber was inflated to a physiologic pressure with balanced salt solution.  Intracameral vigamox 0.1 mL undiltued was injected into the eye and a drop placed onto the ocular surface.  No wound leaks were noted.  The patient was taken to the recovery room in stable condition without complications of anesthesia or surgery  Benay Pillow 08/02/2018, 11:51 AM

## 2018-08-02 NOTE — H&P (Signed)

## 2018-08-03 ENCOUNTER — Encounter: Payer: Self-pay | Admitting: Ophthalmology

## 2018-08-04 ENCOUNTER — Ambulatory Visit: Payer: Medicare Other | Admitting: Hematology and Oncology

## 2018-08-04 ENCOUNTER — Ambulatory Visit: Payer: Medicare Other

## 2018-08-06 ENCOUNTER — Other Ambulatory Visit: Payer: Self-pay

## 2018-08-07 ENCOUNTER — Other Ambulatory Visit: Payer: Self-pay | Admitting: Hematology and Oncology

## 2018-08-07 DIAGNOSIS — D509 Iron deficiency anemia, unspecified: Secondary | ICD-10-CM

## 2018-08-07 DIAGNOSIS — E538 Deficiency of other specified B group vitamins: Secondary | ICD-10-CM

## 2018-08-09 ENCOUNTER — Other Ambulatory Visit: Payer: Self-pay

## 2018-08-09 ENCOUNTER — Ambulatory Visit: Payer: Medicare Other

## 2018-08-09 ENCOUNTER — Encounter: Payer: Self-pay | Admitting: Hematology and Oncology

## 2018-08-09 ENCOUNTER — Inpatient Hospital Stay (HOSPITAL_BASED_OUTPATIENT_CLINIC_OR_DEPARTMENT_OTHER): Payer: Medicare Other | Admitting: Hematology and Oncology

## 2018-08-09 ENCOUNTER — Inpatient Hospital Stay: Payer: Medicare Other

## 2018-08-09 VITALS — BP 142/67 | HR 58 | Temp 97.3°F | Resp 18 | Ht 68.0 in | Wt 185.0 lb

## 2018-08-09 DIAGNOSIS — E538 Deficiency of other specified B group vitamins: Secondary | ICD-10-CM | POA: Diagnosis not present

## 2018-08-09 DIAGNOSIS — D509 Iron deficiency anemia, unspecified: Secondary | ICD-10-CM

## 2018-08-09 LAB — CBC WITH DIFFERENTIAL/PLATELET
Abs Immature Granulocytes: 0.04 10*3/uL (ref 0.00–0.07)
Basophils Absolute: 0 10*3/uL (ref 0.0–0.1)
Basophils Relative: 1 %
Eosinophils Absolute: 0.4 10*3/uL (ref 0.0–0.5)
Eosinophils Relative: 7 %
HCT: 31.5 % — ABNORMAL LOW (ref 39.0–52.0)
Hemoglobin: 10.8 g/dL — ABNORMAL LOW (ref 13.0–17.0)
Immature Granulocytes: 1 %
Lymphocytes Relative: 30 %
Lymphs Abs: 1.7 10*3/uL (ref 0.7–4.0)
MCH: 31.2 pg (ref 26.0–34.0)
MCHC: 34.3 g/dL (ref 30.0–36.0)
MCV: 91 fL (ref 80.0–100.0)
Monocytes Absolute: 0.5 10*3/uL (ref 0.1–1.0)
Monocytes Relative: 8 %
Neutro Abs: 3 10*3/uL (ref 1.7–7.7)
Neutrophils Relative %: 53 %
Platelets: 209 10*3/uL (ref 150–400)
RBC: 3.46 MIL/uL — ABNORMAL LOW (ref 4.22–5.81)
RDW: 15.9 % — ABNORMAL HIGH (ref 11.5–15.5)
WBC: 5.7 10*3/uL (ref 4.0–10.5)
nRBC: 0 % (ref 0.0–0.2)

## 2018-08-09 LAB — IRON AND TIBC
Iron: 81 ug/dL (ref 45–182)
Saturation Ratios: 24 % (ref 17.9–39.5)
TIBC: 335 ug/dL (ref 250–450)
UIBC: 254 ug/dL

## 2018-08-09 LAB — VITAMIN B12: Vitamin B-12: 498 pg/mL (ref 180–914)

## 2018-08-09 LAB — FERRITIN: Ferritin: 72 ng/mL (ref 24–336)

## 2018-08-09 NOTE — Progress Notes (Signed)
[facility]  [address] [place], [place] [address] Phone: [phone]  Fax: [phone]   Clinic Day:  08/09/2018  Referring physician: Sallee Lange, *  Chief Complaint: Gregory Tran is a 83 y.o. male with iron deficiency anemia, B12 deficiency, and anemia of chronic kidney disease who isseen for 6 week assessment.  HPI: The patient was last seen in the hematology clinic on 06/24/2018. At that time, he remained fatigued.  He had a hard time getting up and felt like he needed to stretch his muscles.  Exam revealed no adenopathy or hepatosplenomegaly. Hemoglobin was 10.1, hematocrit 30.3, and MCV 86.3. Ferritin was 12.   He received Venofer on 06/24/2018, 07/01/2018, 07/08/2018.   He underwent a left cataract extraction on 08/02/2018.   Labs followed: 07/28/2018: Creatinine 1.41. B12 436. Ferritin 91. Intrinsic factor 1.1. Anti-parietal antibody 5.0.   During the interim, the patient says he "feels not so good, and about the same." He reports being fatigued and wanting to sleep more than usual.   He is taking over the counter oral B12. He states "years back when I was taking oral B12 complex I had some trouble." His diet is unchanged. He eats when he needs to, but not because he is hungry. His weight is down 1lb. He states "my bowel movement are very dark."   Past Medical History:  Diagnosis Date  . Arthritis   . BPH (benign prostatic hyperplasia)   . Cancer (HCC)    Basal Cell  . Chronic kidney disease   . Coronary artery disease   . Diabetes mellitus ([place])   . Ejaculatory disorder   . Erectile dysfunction   . Frequency   . GERD (gastroesophageal reflux disease)   . Gross hematuria   . Heart disease   . Hematuria   . HTN (hypertension)   . Hyperlipidemia   . Incomplete bladder emptying   . Microscopic hematuria   . Myocardial infarction ([place])   . Neuropathy   . Nocturia   . Rotator cuff tear left  . TIA  (transient ischemic attack)   . Tick bite of multiple sites 5 days ago   chest, and groin area  . Vertigo    1-2x/yr    Past Surgical History:  Procedure Laterality Date  . cardiac stents    . CATARACT EXTRACTION W/PHACO Left 08/02/2018   Procedure: CATARACT EXTRACTION PHACO AND INTRAOCULAR LENS PLACEMENT ([place])  LEFT DIABETIC;  Surgeon: Eulogio Bear, MD;  Location: [facility];  Service: Ophthalmology;  Laterality: Left;  diabetes - insulin and oral meds  . CORONARY ANGIOPLASTY  2001, 2008   [facility]  . DUPUYTREN CONTRACTURE RELEASE Right 04/30/2016   Procedure: DUPUYTREN CONTRACTURE RELEASE;  Surgeon: Leanor Kail, MD;  Location: [facility];  Service: Orthopedics;  Laterality: Right;  . KYPHOPLASTY N/A 01/26/2018   Procedure: Elwanda [place];  Surgeon: Hessie Knows, MD;  Location: [facility];  Service: Orthopedics;  Laterality: N/A;  . ROTATOR CUFF REPAIR Bilateral   . TRANSURETHRAL RESECTION OF PROSTATE N/A 07/09/2015   Procedure: TRANSURETHRAL RESECTION OF THE PROSTATE (TURP);  Surgeon: Hollice Espy, MD;  Location: [facility];  Service: Urology;  Laterality: N/A;    Family History  Problem Relation Age of Onset  . Ovarian cancer Sister   . Kidney disease Brother        born one kidney  . Bladder Cancer Neg Hx   . Prostate cancer Neg Hx     Social History:  reports  that he quit smoking about 13 months ago. His smoking use included cigarettes. He has a 50.00 pack-year smoking history. He has never used smokeless tobacco. He reports that he does not drink alcohol or use drugs. His significant otheris [employer] He lives in [place]. He is alone today.   Allergies:  Allergies  Allergen Reactions  . Atorvastatin Other (See Comments)  . B Complex Formula 1 Hives    Current Medications: Current Outpatient Medications  Medication Sig Dispense Refill  . acetaminophen (TYLENOL) 650 MG CR tablet Take 650 mg by mouth every 8 (eight) hours as needed for pain.    Marland Kitchen  amLODipine (NORVASC) 5 MG tablet TAKE 1 TABLET BY MOUTH DAILY  4  . aspirin EC 81 MG tablet Take 81 mg by mouth daily.     . chlorthalidone (HYGROTON) 50 MG tablet Take 1 tablet by mouth daily.    . Cyanocobalamin (VITAMIN B-12 PO) Take by mouth daily.    Marland Kitchen docusate sodium (COLACE) 250 MG capsule Take 1 tablet by mouth 2 (two) times daily.    Marland Kitchen glucose blood (ONE TOUCH ULTRA TEST) test strip U 1 STRIP VIA METER 2 TO 3 XD UTD IN A ROTATING PATTERN    . hydrOXYzine (ATARAX/VISTARIL) 50 MG tablet Take 1 tablet by mouth 2 (two) times daily.    . Insulin Pen Needle (FIFTY50 PEN NEEDLES) 32G X 4 MM MISC by Does not apply route.    . Iron-Vitamin C 65-125 MG TABS Take 1 tablet by mouth 2 (two) times daily. 60 tablet 1  . LEVEMIR FLEXTOUCH 100 UNIT/ML Pen 55 Units.   3  . metFORMIN (GLUCOPHAGE) 1000 MG tablet Take 1,000 mg by mouth 2 (two) times daily with a meal.     . metoprolol succinate (TOPROL-XL) 50 MG 24 hr tablet Take 25 mg by mouth daily.     Marland Kitchen NOVOLOG FLEXPEN 100 UNIT/ML FlexPen Inject 22 Units into the skin 3 (three) times daily with meals.   3  . pantoprazole (PROTONIX) 40 MG tablet Take 40 mg by mouth daily.     . rosuvastatin (CRESTOR) 5 MG tablet Take 5 mg by mouth every other day.   1  . Selenium 100 MCG TABS Take 1 tablet by mouth daily.     . tamsulosin (FLOMAX) 0.4 MG CAPS capsule TAKE 1 CAPSULE(0.4 MG) BY MOUTH DAILY 30 capsule 3  . triamcinolone ointment (KENALOG) 0.5 % Apply 1 application topically 2 (two) times daily as needed.     No current facility-administered medications for this visit.     Review of Systems  Constitutional: Positive for malaise/fatigue and weight loss (down 1 lb.). Negative for chills and fever.       "Feels not so good, and about the same."  HENT: Negative.  Negative for congestion, ear pain, hearing loss, nosebleeds, sinus pain and sore throat.   Eyes: Negative for blurred vision and double vision.       Needs cataract surgery.  Respiratory:  Negative.  Negative for cough, hemoptysis, shortness of breath and wheezing.   Cardiovascular: Negative.  Negative for chest pain, palpitations, orthopnea, leg swelling and PND.  Gastrointestinal: Positive for melena. Negative for abdominal pain, constipation, diarrhea, heartburn, nausea and vomiting.       "My appetite is good. Food doesn't taste like it used to." Ice pica.  Genitourinary: Negative.  Negative for dysuria, frequency, hematuria and urgency.  Musculoskeletal: Negative for back pain, joint pain and myalgias.  Skin: Negative.  Negative for itching and rash.  Neurological: Negative for dizziness, tingling, sensory change, focal weakness, weakness and headaches.  Endo/Heme/Allergies: Negative.   Psychiatric/Behavioral: Positive for memory loss ("my brain goes squirrely"). Negative for depression. The patient is not nervous/anxious and does not have insomnia.   All other systems reviewed and are negative.  Performance status (ECOG): 2  Vitals: Blood pressure (!) 142/67, pulse (!) 58, temperature (!) 97.3 F (36.3 C), temperature source Tympanic, resp. rate 18, height 5\' 8"  (1.727 m), weight 184 lb 15.5 oz (83.9 kg), SpO2 99 %.  Physical Exam  Constitutional: He is oriented to person, place, and time. He appears well-developed and well-nourished. No distress.  He requires assistance onto table.  He has a cane by his side.  HENT:  Head: Normocephalic and atraumatic.  Mouth/Throat: Oropharynx is clear and moist. No oropharyngeal exudate.  White hair and mustache.  Male pattern baldness. Wearing a mask.  Eyes: Pupils are equal, round, and reactive to light. Conjunctivae are normal. No scleral icterus.  Glasses.  Blue eyes.  Neck: Normal range of motion. Neck supple. No JVD present.  Cardiovascular: Normal rate, regular rhythm and normal heart sounds. Exam reveals no gallop and no friction rub.  No murmur heard. Pulmonary/Chest: Effort normal and breath sounds normal. No  respiratory distress. He has no wheezes. He has no rales.  Abdominal: Soft. Bowel sounds are normal. He exhibits no distension and no mass. There is no abdominal tenderness. There is no rebound and no guarding.  Musculoskeletal: Normal range of motion.        General: No tenderness or edema.  Lymphadenopathy:    He has no cervical adenopathy.    He has no axillary adenopathy.       Right: No supraclavicular adenopathy present.       Left: No supraclavicular adenopathy present.  Neurological: He is alert and oriented to person, place, and time. Gait (due to joint stiffness in legs) abnormal.  Skin: Skin is warm and dry. No rash noted. He is not diaphoretic. No erythema. No pallor.  Psychiatric: He has a normal mood and affect. Judgment and thought content normal.  Nursing note and vitals reviewed.   Office Visit on 08/09/2018  Component Date Value Ref Range Status  . WBC 08/09/2018 5.7  4.0 - 10.5 K/uL Final  . RBC 08/09/2018 3.46* 4.22 - 5.81 MIL/uL Final  . Hemoglobin 08/09/2018 10.8* 13.0 - 17.0 g/dL Final  . HCT 08/09/2018 31.5* 39.0 - 52.0 % Final  . MCV 08/09/2018 91.0  80.0 - 100.0 fL Final  . MCH 08/09/2018 31.2  26.0 - 34.0 pg Final  . MCHC 08/09/2018 34.3  30.0 - 36.0 g/dL Final  . RDW 08/09/2018 15.9* 11.5 - 15.5 % Final  . Platelets 08/09/2018 209  150 - 400 K/uL Final  . nRBC 08/09/2018 0.0  0.0 - 0.2 % Final  . Neutrophils Relative % 08/09/2018 53  % Final  . Neutro Abs 08/09/2018 3.0  1.7 - 7.7 K/uL Final  . Lymphocytes Relative 08/09/2018 30  % Final  . Lymphs Abs 08/09/2018 1.7  0.7 - 4.0 K/uL Final  . Monocytes Relative 08/09/2018 8  % Final  . Monocytes Absolute 08/09/2018 0.5  0.1 - 1.0 K/uL Final  . Eosinophils Relative 08/09/2018 7  % Final  . Eosinophils Absolute 08/09/2018 0.4  0.0 - 0.5 K/uL Final  . Basophils Relative 08/09/2018 1  % Final  . Basophils Absolute 08/09/2018 0.0  0.0 - 0.1 K/uL Final  .  Immature Granulocytes 08/09/2018 1  % Final  . Abs  Immature Granulocytes 08/09/2018 0.04  0.00 - 0.07 K/uL Final   Performed at [facility], [address], [place], [place] [address]    Assessment:  Gregory Tran is a 83 y.o. male with a normocytic anemiasecondaryto iron deficiency, B12 deficiency, andanemia of chronic renal disease.  Work-up on 02/13/2020revealed a hematocrit of 29.2, hemoglobin 9.3, MCV 85.1, platelets 287,000, WBC 6600 with an ANC of 3500. Normal studiesincluded: SPEP, folate, and TSH. Free light chain ratio was 2.10 (0.26-1.65). Epo level was 52.5. B12was 231. Ferritinwas 13. Iron saturation was 10% with a TIBC of 442. Retic was 2.1%.  He hasB12 deficiency. B12 was 231 on 04/01/2018 and 302 on 04/27/2018. He tried oral B12 x 2-3 days but discontinued as it made him jittery. Anti-parietal antibodies and intrinsic factor antibodies were negative on 07/28/2018.  He has iron deficiency. Diet is poor. He denies any bleeding.He has not had a colonoscopy in years.  Stool was guaiac - x 3 in 03/2018.  Urinalysis revealed trace hemoglobin on 04/15/2018.  He has ice pica.  He received Venofer weekly x 3 (06/24/2018 - 07/08/2018).   Ferritin has been followed: 13 on 04/01/2018 and 12 on 06/23/2018.   He has chronic renal insufficiency.  Creatinine was 1.5 on 04/15/2018 (CrCl 43 ml/min).  Symptomatically, he feels fatigued.  He wants to sleep a lot.  Diet remains poor.  Exam is stable.  Plan: 1.   Labs today: CBC with diff, ferritin, B12.  2.Iron deficiency anemia Hematocrit 31.5.  Hemoglobin 10.8.  MCV 91.    Ferritin 72.  Iron saturation 24% with a TIBC of 335.    Etiology felt secondary to a poor diet. He denies any melena or hematochezia.              Guaiac cards x3 were negative. Urinalysis revealed trace hemoglobin.                         Follow-up with Dr. Holley Raring. Hemoglobin has improved slightly since initiation of IV iron.              He may have some component of anemia of chronic disease.  Continue to monitor. 3.B12 deficiency B12 is 498 today.  B12 goal is > 400 Continue oral B12 (unknown dose).             No B12 injections required.   4. Anemia of chronic renal disease Creatinine is 1.41.  Ensure iron stores remain replete before initiation of Retacrit (if Hgb < 10) 5.   RN to call patient when he returns home to review dose of B12 he is taking. 6.   RTC in 2 months for labs (CBC with diff, ferritin). 7.   RTC in 4 months for MD assessment and labs (CBC with diff, ferritin-day before), and +/- Venofer.  I discussed the assessment and treatment plan with the patient.  The patient was provided an opportunity to ask questions and all were answered.  The patient agreed with the plan and demonstrated an understanding of the instructions.  The patient was advised to call back if the symptoms worsen or if the condition fails to improve as anticipated.   Lequita Asal, MD, PhD    08/09/2018, 3:06 PM  I, Selena Batten, am acting as scribe for [employer]. Mike Gip, MD, PhD.  I,  C. Mike Gip, MD, have reviewed the above documentation  for accuracy and completeness, and I agree with the above.

## 2018-08-09 NOTE — Progress Notes (Signed)
No new changes noted today 

## 2018-10-01 ENCOUNTER — Other Ambulatory Visit: Payer: Self-pay

## 2018-10-04 ENCOUNTER — Encounter: Payer: Self-pay | Admitting: *Deleted

## 2018-10-04 ENCOUNTER — Inpatient Hospital Stay: Payer: Medicare Other | Attending: Hematology and Oncology

## 2018-10-04 ENCOUNTER — Other Ambulatory Visit: Payer: Self-pay

## 2018-10-04 DIAGNOSIS — D509 Iron deficiency anemia, unspecified: Secondary | ICD-10-CM | POA: Insufficient documentation

## 2018-10-04 LAB — CBC WITH DIFFERENTIAL/PLATELET
Abs Immature Granulocytes: 0.03 10*3/uL (ref 0.00–0.07)
Basophils Absolute: 0 10*3/uL (ref 0.0–0.1)
Basophils Relative: 1 %
Eosinophils Absolute: 0.4 10*3/uL (ref 0.0–0.5)
Eosinophils Relative: 6 %
HCT: 33.9 % — ABNORMAL LOW (ref 39.0–52.0)
Hemoglobin: 11.9 g/dL — ABNORMAL LOW (ref 13.0–17.0)
Immature Granulocytes: 0 %
Lymphocytes Relative: 27 %
Lymphs Abs: 1.8 10*3/uL (ref 0.7–4.0)
MCH: 31.8 pg (ref 26.0–34.0)
MCHC: 35.1 g/dL (ref 30.0–36.0)
MCV: 90.6 fL (ref 80.0–100.0)
Monocytes Absolute: 0.7 10*3/uL (ref 0.1–1.0)
Monocytes Relative: 11 %
Neutro Abs: 3.7 10*3/uL (ref 1.7–7.7)
Neutrophils Relative %: 55 %
Platelets: 250 10*3/uL (ref 150–400)
RBC: 3.74 MIL/uL — ABNORMAL LOW (ref 4.22–5.81)
RDW: 14 % (ref 11.5–15.5)
WBC: 6.8 10*3/uL (ref 4.0–10.5)
nRBC: 0 % (ref 0.0–0.2)

## 2018-10-04 LAB — FERRITIN: Ferritin: 57 ng/mL (ref 24–336)

## 2018-10-06 NOTE — Discharge Instructions (Signed)

## 2018-10-07 ENCOUNTER — Other Ambulatory Visit: Payer: Self-pay

## 2018-10-07 ENCOUNTER — Other Ambulatory Visit
Admission: RE | Admit: 2018-10-07 | Discharge: 2018-10-07 | Disposition: A | Discharge status: Home / Self Care | Payer: Medicare Other | Source: Ambulatory Visit | Attending: Ophthalmology | Admitting: Ophthalmology

## 2018-10-07 DIAGNOSIS — Z20828 Contact with and (suspected) exposure to other viral communicable diseases: Secondary | ICD-10-CM | POA: Insufficient documentation

## 2018-10-07 DIAGNOSIS — Z01812 Encounter for preprocedural laboratory examination: Secondary | ICD-10-CM | POA: Insufficient documentation

## 2018-10-07 LAB — SARS CORONAVIRUS 2 (TAT 6-24 HRS): SARS Coronavirus 2: NEGATIVE

## 2018-10-11 ENCOUNTER — Encounter
Admission: AD | Disposition: A | Discharge status: Home / Self Care | Payer: Self-pay | Source: Home / Self Care | Attending: Ophthalmology

## 2018-10-11 ENCOUNTER — Ambulatory Visit
Admission: AD | Admit: 2018-10-11 | Discharge: 2018-10-11 | Disposition: A | Discharge status: Home / Self Care | Payer: Medicare Other | Attending: Ophthalmology | Admitting: Ophthalmology

## 2018-10-11 ENCOUNTER — Ambulatory Visit: Payer: Medicare Other | Admitting: Anesthesiology

## 2018-10-11 ENCOUNTER — Other Ambulatory Visit: Payer: Self-pay

## 2018-10-11 DIAGNOSIS — N289 Disorder of kidney and ureter, unspecified: Secondary | ICD-10-CM | POA: Insufficient documentation

## 2018-10-11 DIAGNOSIS — I252 Old myocardial infarction: Secondary | ICD-10-CM | POA: Diagnosis not present

## 2018-10-11 DIAGNOSIS — K219 Gastro-esophageal reflux disease without esophagitis: Secondary | ICD-10-CM | POA: Insufficient documentation

## 2018-10-11 DIAGNOSIS — E785 Hyperlipidemia, unspecified: Secondary | ICD-10-CM | POA: Diagnosis not present

## 2018-10-11 DIAGNOSIS — Z955 Presence of coronary angioplasty implant and graft: Secondary | ICD-10-CM | POA: Insufficient documentation

## 2018-10-11 DIAGNOSIS — H2511 Age-related nuclear cataract, right eye: Secondary | ICD-10-CM | POA: Insufficient documentation

## 2018-10-11 DIAGNOSIS — I2510 Atherosclerotic heart disease of native coronary artery without angina pectoris: Secondary | ICD-10-CM | POA: Diagnosis not present

## 2018-10-11 DIAGNOSIS — Z8673 Personal history of transient ischemic attack (TIA), and cerebral infarction without residual deficits: Secondary | ICD-10-CM | POA: Insufficient documentation

## 2018-10-11 DIAGNOSIS — I10 Essential (primary) hypertension: Secondary | ICD-10-CM | POA: Insufficient documentation

## 2018-10-11 DIAGNOSIS — Z87891 Personal history of nicotine dependence: Secondary | ICD-10-CM | POA: Insufficient documentation

## 2018-10-11 DIAGNOSIS — M1990 Unspecified osteoarthritis, unspecified site: Secondary | ICD-10-CM | POA: Insufficient documentation

## 2018-10-11 DIAGNOSIS — E119 Type 2 diabetes mellitus without complications: Secondary | ICD-10-CM | POA: Diagnosis not present

## 2018-10-11 HISTORY — PX: CATARACT EXTRACTION W/PHACO: SHX586

## 2018-10-11 LAB — GLUCOSE, CAPILLARY
Glucose-Capillary: 196 mg/dL — ABNORMAL HIGH (ref 70–99)
Glucose-Capillary: 197 mg/dL — ABNORMAL HIGH (ref 70–99)

## 2018-10-11 SURGERY — PHACOEMULSIFICATION, CATARACT, WITH IOL INSERTION
Anesthesia: Monitor Anesthesia Care | Site: Eye | Laterality: Right

## 2018-10-11 MED ORDER — SODIUM HYALURONATE 10 MG/ML IO SOLN
INTRAOCULAR | Status: DC | PRN
  Administered 2018-10-11: 0.55 mL via INTRAOCULAR

## 2018-10-11 MED ORDER — SODIUM HYALURONATE 23 MG/ML IO SOLN
INTRAOCULAR | Status: DC | PRN
  Administered 2018-10-11: 0.6 mL via INTRAOCULAR

## 2018-10-11 MED ORDER — FENTANYL CITRATE (PF) 100 MCG/2ML IJ SOLN
INTRAMUSCULAR | Status: DC | PRN
  Administered 2018-10-11: 50 ug via INTRAVENOUS

## 2018-10-11 MED ORDER — ARMC OPHTHALMIC DILATING DROPS
1.0000 "application " | OPHTHALMIC | Status: DC | PRN
  Administered 2018-10-11 (×3): 1 via OPHTHALMIC

## 2018-10-11 MED ORDER — EPINEPHRINE PF 1 MG/ML IJ SOLN
INTRAOCULAR | Status: DC | PRN
  Administered 2018-10-11: 98 mL via OPHTHALMIC

## 2018-10-11 MED ORDER — TETRACAINE HCL 0.5 % OP SOLN
1.0000 [drp] | OPHTHALMIC | Status: DC | PRN
  Administered 2018-10-11 (×3): 1 [drp] via OPHTHALMIC

## 2018-10-11 MED ORDER — ACETAMINOPHEN 325 MG PO TABS: 325.0000 mg | ORAL_TABLET | Freq: Once | ORAL | Status: DC

## 2018-10-11 MED ORDER — MIDAZOLAM HCL 2 MG/2ML IJ SOLN
INTRAMUSCULAR | Status: DC | PRN
  Administered 2018-10-11: 0.5 mg via INTRAVENOUS

## 2018-10-11 MED ORDER — MOXIFLOXACIN HCL 0.5 % OP SOLN
OPHTHALMIC | Status: DC | PRN
  Administered 2018-10-11: 0.2 mL via OPHTHALMIC

## 2018-10-11 MED ORDER — ACETAMINOPHEN 160 MG/5ML PO SOLN: 325.0000 mg | Freq: Once | ORAL | Status: DC

## 2018-10-11 MED ORDER — LIDOCAINE HCL (PF) 2 % IJ SOLN
INTRAOCULAR | Status: DC | PRN
  Administered 2018-10-11: 1 mL via INTRAOCULAR

## 2018-10-11 SURGICAL SUPPLY — 19 items
CANNULA ANT/CHMB 27G (MISCELLANEOUS) ×2 IMPLANT
CANNULA ANT/CHMB 27GA (MISCELLANEOUS) ×6 IMPLANT
DISSECTOR HYDRO NUCLEUS 50X22 (MISCELLANEOUS) ×3 IMPLANT
GLOVE SURG LX 7.5 STRW (GLOVE) ×2
GLOVE SURG LX STRL 7.5 STRW (GLOVE) ×1 IMPLANT
GLOVE SURG SYN 8.5  E (GLOVE) ×2
GLOVE SURG SYN 8.5 E (GLOVE) ×1 IMPLANT
GLOVE SURG SYN 8.5 PF PI (GLOVE) ×1 IMPLANT
GOWN STRL REUS W/ TWL LRG LVL3 (GOWN DISPOSABLE) ×2 IMPLANT
GOWN STRL REUS W/TWL LRG LVL3 (GOWN DISPOSABLE) ×4
LENS IOL TECNIS ITEC 22.0 (Intraocular Lens) ×2 IMPLANT
MARKER SKIN DUAL TIP RULER LAB (MISCELLANEOUS) ×3 IMPLANT
PACK DR. KING ARMS (PACKS) ×3 IMPLANT
PACK EYE AFTER SURG (MISCELLANEOUS) ×3 IMPLANT
PACK OPTHALMIC (MISCELLANEOUS) ×3 IMPLANT
SYR 3ML LL SCALE MARK (SYRINGE) ×3 IMPLANT
SYR TB 1ML LUER SLIP (SYRINGE) ×3 IMPLANT
WATER STERILE IRR 250ML POUR (IV SOLUTION) ×3 IMPLANT
WIPE NON LINTING 3.25X3.25 (MISCELLANEOUS) ×3 IMPLANT

## 2018-10-11 NOTE — Anesthesia Procedure Notes (Signed)
Procedure Name: MAC Date/Time: 10/11/2018 7:34 AM Performed by: Cameron Ali, CRNA Pre-anesthesia Checklist: Patient identified, Emergency Drugs available, Suction available, Timeout performed and Patient being monitored Patient Re-evaluated:Patient Re-evaluated prior to induction Oxygen Delivery Method: Nasal cannula Placement Confirmation: positive ETCO2

## 2018-10-11 NOTE — H&P (Signed)

## 2018-10-11 NOTE — Anesthesia Postprocedure Evaluation (Signed)
Anesthesia Post Note  Patient: Gregory Tran  Procedure(s) Performed: CATARACT EXTRACTION PHACO AND INTRAOCULAR LENS PLACEMENT ([facility]) RIGHT DIABETES (Right Eye)  Patient location during evaluation: PACU Anesthesia Type: MAC Level of consciousness: awake and alert and oriented Pain management: satisfactory to patient Vital Signs Assessment: post-procedure vital signs reviewed and stable Respiratory status: spontaneous breathing, nonlabored ventilation and respiratory function stable Cardiovascular status: blood pressure returned to baseline and stable Postop Assessment: Adequate PO intake and No signs of nausea or vomiting Anesthetic complications: no    Raliegh Ip

## 2018-10-11 NOTE — Anesthesia Preprocedure Evaluation (Signed)
Anesthesia Evaluation  Patient identified by MRN, date of birth, ID band Patient awake    Reviewed: Allergy & Precautions, H&P , NPO status , Patient's Chart, lab work & pertinent test results  Airway Mallampati: IV  TM Distance: >3 FB Neck ROM: full    Dental no notable dental hx.    Pulmonary former smoker,    Pulmonary exam normal breath sounds clear to auscultation       Cardiovascular hypertension, + CAD, + Past MI and + Cardiac Stents (x 2 (2001, 2008))  Normal cardiovascular exam Rhythm:Regular Rate:Normal  HLD  TTE 2018 - normal EF   Neuro/Psych TIA   GI/Hepatic GERD  ,  Endo/Other  diabetes, Type 2  Renal/GU Renal InsufficiencyRenal disease     Musculoskeletal  (+) Arthritis ,   Abdominal   Peds  Hematology  (+) Blood dyscrasia, anemia ,   Anesthesia Other Findings   Reproductive/Obstetrics                             Anesthesia Physical  Anesthesia Plan  ASA: III  Anesthesia Plan: MAC   Post-op Pain Management:    Induction: Intravenous  PONV Risk Score and Plan: 1 and Midazolam, TIVA and Treatment may vary due to age or medical condition  Airway Management Planned: Natural Airway and Nasal Cannula  Additional Equipment:   Intra-op Plan:   Post-operative Plan:   Informed Consent: I have reviewed the patients History and Physical, chart, labs and discussed the procedure including the risks, benefits and alternatives for the proposed anesthesia with the patient or authorized representative who has indicated his/her understanding and acceptance.       Plan Discussed with: CRNA  Anesthesia Plan Comments:         Anesthesia Quick Evaluation

## 2018-10-11 NOTE — Op Note (Signed)
OPERATIVE NOTE  Gregory Tran [id number] 10/11/2018   PREOPERATIVE DIAGNOSIS:  Nuclear sclerotic cataract right eye.  H25.11   POSTOPERATIVE DIAGNOSIS:    Nuclear sclerotic cataract right eye.     PROCEDURE:  Phacoemusification with posterior chamber intraocular lens placement of the right eye   LENS:   Implant Name Type Inv. Item Serial No. Manufacturer Lot No. LRB No. Used Action  LENS IOL DIOP 22.0 - [id number] Intraocular Lens LENS IOL DIOP 22.0 [id number]  Right 1 Implanted       PCB00 +22.0   ULTRASOUND TIME: 1 minutes 14 seconds.  CDE 9.23   SURGEON:  Benay Pillow, MD, MPH  ANESTHESIOLOGIST: Anesthesiologist: Ronelle Nigh, MD CRNA: Cameron Ali, CRNA   ANESTHESIA:  Topical with tetracaine drops augmented with 1% preservative-free intracameral lidocaine.  ESTIMATED BLOOD LOSS: less than 1 mL.   COMPLICATIONS:  None.   DESCRIPTION OF PROCEDURE:  The patient was identified in the holding room and transported to the operating room and placed in the supine position under the operating microscope.  The right eye was identified as the operative eye and it was prepped and draped in the usual sterile ophthalmic fashion.   A 1.0 millimeter clear-corneal paracentesis was made at the 10:30 position. 0.5 ml of preservative-free 1% lidocaine with epinephrine was injected into the anterior chamber.  The anterior chamber was filled with Healon 5 viscoelastic.  A 2.4 millimeter keratome was used to make a near-clear corneal incision at the 8:00 position.  A curvilinear capsulorrhexis was made with a cystotome and capsulorrhexis forceps.  Balanced salt solution was used to hydrodissect and hydrodelineate the nucleus.   Phacoemulsification was then used in stop and chop fashion to remove the lens nucleus and epinucleus.  The remaining cortex was then removed using the irrigation and aspiration handpiece. Healon was then placed into the capsular bag to distend it for lens  placement.  A lens was then injected into the capsular bag.  The remaining viscoelastic was aspirated.   Wounds were hydrated with balanced salt solution.  The anterior chamber was inflated to a physiologic pressure with balanced salt solution.   Intracameral vigamox 0.1 mL undiluted was injected into the eye and a drop placed onto the ocular surface.  No wound leaks were noted.  The patient was taken to the recovery room in stable condition without complications of anesthesia or surgery  Benay Pillow 10/11/2018, 8:04 AM

## 2018-10-11 NOTE — Transfer of Care (Signed)
Immediate Anesthesia Transfer of Care Note  Patient: Gregory Tran  Procedure(s) Performed: CATARACT EXTRACTION PHACO AND INTRAOCULAR LENS PLACEMENT ([facility]) RIGHT DIABETES (Right Eye)  Patient Location: PACU  Anesthesia Type: MAC  Level of Consciousness: awake, alert  and patient cooperative  Airway and Oxygen Therapy: Patient Spontanous Breathing and Patient connected to supplemental oxygen  Post-op Assessment: Post-op Vital signs reviewed, Patient's Cardiovascular Status Stable, Respiratory Function Stable, Patent Airway and No signs of Nausea or vomiting  Post-op Vital Signs: Reviewed and stable  Complications: No apparent anesthesia complications

## 2018-10-12 ENCOUNTER — Encounter: Payer: Self-pay | Admitting: Ophthalmology

## 2018-12-03 ENCOUNTER — Inpatient Hospital Stay: Payer: Medicare Other

## 2018-12-06 ENCOUNTER — Ambulatory Visit: Payer: Medicare Other | Admitting: Hematology and Oncology

## 2018-12-06 ENCOUNTER — Ambulatory Visit: Payer: Medicare Other

## 2018-12-08 ENCOUNTER — Inpatient Hospital Stay: Payer: Medicare Other

## 2018-12-09 ENCOUNTER — Ambulatory Visit: Payer: Medicare Other

## 2018-12-09 ENCOUNTER — Ambulatory Visit: Payer: Medicare Other | Admitting: Hematology and Oncology

## 2018-12-14 ENCOUNTER — Other Ambulatory Visit: Payer: Self-pay

## 2018-12-14 NOTE — Progress Notes (Signed)
[facility]  [address], [address] [place], [place] [address] Phone: [phone]  Fax: [phone]   Clinic Day:  12/16/2018  Referring physician: Sallee Lange, *  Chief Complaint: Gregory Tran is a 83 y.o. male with iron deficiency anemia, B12 deficiency, andanemia of chronic kidney disease who is seen for 4 month assessment.  HPI: The patient was last seen in the hematology clinic on 08/09/2018.  At that time, he felt fatigued. He wanted to sleep a lot. Diet remained poor. Exam was stable. Hematocrit 31.5, hemoglobin 10.8 (improved slightly since initiation of IV iron), MCV 91. Ferritin was 72 with and iron saturation of 24% and a TIBC of 335. Creatinine was 1.41. B-12 was 498.    Patient underwent right eye cataract surgery by Dr. Benay Pillow on 10/11/2018.   Labs followed: 10/04/2018: Hematocrit 33.9, hemoglobin 11.9, MCV 90.6, platelets 250,000, WBC 6,800. Ferritin 57. 12/15/2018: Hematocrit 32.9, hemoglobin 11.4, MCV 91.9, platelets 251,000, WBC 5,300. Ferritin 28.  During the interim, the patient has been doing "ok".  His diet is improving. His energy level is unchanged. He denies chest pain, and shortness of breath. He notes a callus on his trigger finger on the left hand. He has constipation that can last 2-3 days. He is on stool softeners. I advised him to eat fruits with the letter "P". He reports having a low blood sugar. He is taking oral iron.   He reports a syncopal episode a few months ago when going to get water from the fridge at night. He fell to the floor. He denied any major trauma. He denies any irregular rhythm. He states he had vertigo.    Past Medical History:  Diagnosis Date  . Arthritis   . BPH (benign prostatic hyperplasia)   . Cancer (HCC)    Basal Cell  . Chronic kidney disease   . Coronary artery disease   . Diabetes mellitus ([place])   . Ejaculatory disorder   . Erectile dysfunction   . Frequency   . GERD  (gastroesophageal reflux disease)   . Gross hematuria   . Heart disease   . Hematuria   . HTN (hypertension)   . Hyperlipidemia   . Incomplete bladder emptying   . Microscopic hematuria   . Myocardial infarction ([place])   . Neuropathy   . Nocturia   . Rotator cuff tear left  . TIA (transient ischemic attack)   . Tick bite of multiple sites 5 days ago   chest, and groin area  . Vertigo    1-2x/yr    Past Surgical History:  Procedure Laterality Date  . cardiac stents    . CATARACT EXTRACTION W/PHACO Left 08/02/2018   Procedure: CATARACT EXTRACTION PHACO AND INTRAOCULAR LENS PLACEMENT ([place])  LEFT DIABETIC;  Surgeon: Eulogio Bear, MD;  Location: [place];  Service: Ophthalmology;  Laterality: Left;  diabetes - insulin and oral meds  . CATARACT EXTRACTION W/PHACO Right 10/11/2018   Procedure: CATARACT EXTRACTION PHACO AND INTRAOCULAR LENS PLACEMENT ([place]) RIGHT DIABETES;  Surgeon: Eulogio Bear, MD;  Location: [place];  Service: Ophthalmology;  Laterality: Right;  Diabetic - insulin and oral meds  . CORONARY ANGIOPLASTY  2001, 2008   [facility]  . DUPUYTREN CONTRACTURE RELEASE Right 04/30/2016   Procedure: DUPUYTREN CONTRACTURE RELEASE;  Surgeon: Leanor Kail, MD;  Location: [facility] ORS;  Service: Orthopedics;  Laterality: Right;  . KYPHOPLASTY N/A 01/26/2018   Procedure: Elwanda [place];  Surgeon: Hessie Knows, MD;  Location: [facility]  ORS;  Service: Orthopedics;  Laterality: N/A;  . ROTATOR CUFF REPAIR Bilateral   . TRANSURETHRAL RESECTION OF PROSTATE N/A 07/09/2015   Procedure: TRANSURETHRAL RESECTION OF THE PROSTATE (TURP);  Surgeon: Hollice Espy, MD;  Location: [facility] ORS;  Service: Urology;  Laterality: N/A;    Family History  Problem Relation Age of Onset  . Ovarian cancer Sister   . Kidney disease Brother        born one kidney  . Bladder Cancer Neg Hx   . Prostate cancer Neg Hx     Social History:  reports that he quit smoking about 17 months ago.  His smoking use included cigarettes. He has a 50.00 pack-year smoking history. He has never used smokeless tobacco. He reports that he does not drink alcohol or use drugs. His significant otheris [employer] He lives in [place]. The patient is alone today.  Allergies:  Allergies  Allergen Reactions  . Atorvastatin Other (See Comments)  . B Complex Formula 1 Hives    Current Medications: Current Outpatient Medications  Medication Sig Dispense Refill  . acetaminophen (TYLENOL) 650 MG CR tablet Take 650 mg by mouth every 8 (eight) hours as needed for pain.    Marland Kitchen amLODipine (NORVASC) 5 MG tablet TAKE 1 TABLET BY MOUTH DAILY  4  . aspirin EC 81 MG tablet Take 81 mg by mouth daily.     . cetirizine (ZYRTEC) 10 MG tablet Take 10 mg by mouth daily as needed for allergies.    . chlorthalidone (HYGROTON) 50 MG tablet Take 1 tablet by mouth daily.    . Cyanocobalamin (VITAMIN B-12 PO) Take by mouth daily.    Marland Kitchen docusate sodium (COLACE) 250 MG capsule Take 1 tablet by mouth 2 (two) times daily.    Marland Kitchen glucose blood (ONE TOUCH ULTRA TEST) test strip U 1 STRIP VIA METER 2 TO 3 XD UTD IN A ROTATING PATTERN    . hydrOXYzine (ATARAX/VISTARIL) 50 MG tablet Take 1 tablet by mouth 2 (two) times daily.    . Insulin Pen Needle (FIFTY50 PEN NEEDLES) 32G X 4 MM MISC by Does not apply route.    Marland Kitchen LEVEMIR FLEXTOUCH 100 UNIT/ML Pen 55 Units.   3  . metFORMIN (GLUCOPHAGE) 1000 MG tablet Take 1,000 mg by mouth 2 (two) times daily with a meal.     . metoprolol succinate (TOPROL-XL) 50 MG 24 hr tablet Take 25 mg by mouth daily.     Marland Kitchen NOVOLOG FLEXPEN 100 UNIT/ML FlexPen Inject 22 Units into the skin 3 (three) times daily with meals.   3  . pantoprazole (PROTONIX) 40 MG tablet Take 40 mg by mouth daily.     . rosuvastatin (CRESTOR) 5 MG tablet Take 5 mg by mouth every other day.   1  . Selenium 100 MCG TABS Take 1 tablet by mouth daily.     . tamsulosin (FLOMAX) 0.4 MG CAPS capsule TAKE 1 CAPSULE(0.4 MG) BY MOUTH DAILY  30 capsule 3  . Iron-Vitamin C 65-125 MG TABS Take 1 tablet by mouth 2 (two) times daily. (Patient not taking: Reported on 10/04/2018) 60 tablet 1  . triamcinolone ointment (KENALOG) 0.5 % Apply 1 application topically 2 (two) times daily as needed.     No current facility-administered medications for this visit.     Review of Systems  Constitutional: Positive for malaise/fatigue and weight loss (5 pounds). Negative for chills and fever.       Doing "ok".  Low energy.  HENT: Negative.  Negative for congestion, ear pain, hearing loss, nosebleeds, sinus pain and sore throat.   Eyes: Negative for blurred vision and double vision.       S/p cataract surgery (right eye).  Respiratory: Negative.  Negative for cough, hemoptysis, shortness of breath and wheezing.   Cardiovascular: Negative.  Negative for chest pain, palpitations, orthopnea, leg swelling and PND.  Gastrointestinal: Positive for constipation (last 2-3 days; on stool softeners). Negative for abdominal pain, diarrhea, heartburn, melena, nausea and vomiting.       Diet improving.  Genitourinary: Negative.  Negative for dysuria, frequency, hematuria and urgency.  Musculoskeletal: Positive for falls (few months ago). Negative for back pain, joint pain and myalgias.  Skin: Negative.  Negative for itching and rash.       Callus on trigger finger on left hand.  Neurological: Negative for dizziness, tingling, sensory change, focal weakness, weakness and headaches.       Syncopal episode a few months ago.  Endo/Heme/Allergies: Negative.  Does not bruise/bleed easily.  Psychiatric/Behavioral: Positive for memory loss ("my brain goes squirrely"). Negative for depression. The patient is not nervous/anxious and does not have insomnia.   All other systems reviewed and are negative.  Performance status (ECOG): 2  Vitals Blood pressure (!) 147/65, pulse 72, temperature (!) 97.1 F (36.2 C), temperature source Tympanic, resp. rate 20, height 5\' 8"   (1.727 m), weight 179 lb 10.8 oz (81.5 kg), SpO2 97 %.  Physical Exam  Constitutional: He is oriented to person, place, and time. He appears well-developed and well-nourished. No distress.  HENT:  Head: Normocephalic and atraumatic.  Mouth/Throat: Oropharynx is clear and moist. No oropharyngeal exudate.  Gray hair and [employer].  Male pattern baldness.  Mask.  Eyes: Pupils are equal, round, and reactive to light. Conjunctivae are normal. No scleral icterus.  Glasses.  Blue eyes.  Neck: Normal range of motion. Neck supple. No JVD present.  Cardiovascular: Normal rate, regular rhythm and normal heart sounds. Exam reveals no gallop and no friction rub.  No murmur heard. Pulmonary/Chest: Effort normal and breath sounds normal. No respiratory distress. He has no wheezes. He has no rales.  Abdominal: Soft. Bowel sounds are normal. He exhibits no distension and no mass. There is no abdominal tenderness. There is no rebound and no guarding.  Musculoskeletal: Normal range of motion.        General: No tenderness or edema.  Lymphadenopathy:    He has no cervical adenopathy.    He has no axillary adenopathy.       Right: No supraclavicular adenopathy present.       Left: No supraclavicular adenopathy present.  Neurological: He is alert and oriented to person, place, and time.  Skin: Skin is warm and dry. No rash noted. He is not diaphoretic. No erythema. No pallor.  Skin irritation from shaving.  Psychiatric: He has a normal mood and affect. His behavior is normal. Judgment and thought content normal.  Nursing note and vitals reviewed.   Appointment on 12/15/2018  Component Date Value Ref Range Status  . Ferritin 12/15/2018 28  24 - 336 ng/mL Final   Performed at [facility], [address] [place],  [address]  . WBC 12/15/2018 5.3  4.0 - 10.5 K/uL Final  . RBC 12/15/2018 3.58* 4.22 - 5.81 MIL/uL Final  . Hemoglobin 12/15/2018 11.4* 13.0 - 17.0 g/dL Final  . HCT 12/15/2018  32.9* 39.0 - 52.0 % Final  . MCV 12/15/2018 91.9  80.0 - 100.0 fL Final  .  MCH 12/15/2018 31.8  26.0 - 34.0 pg Final  . MCHC 12/15/2018 34.7  30.0 - 36.0 g/dL Final  . RDW 12/15/2018 13.8  11.5 - 15.5 % Final  . Platelets 12/15/2018 251  150 - 400 K/uL Final  . nRBC 12/15/2018 0.0  0.0 - 0.2 % Final  . Neutrophils Relative % 12/15/2018 55  % Final  . Neutro Abs 12/15/2018 2.9  1.7 - 7.7 K/uL Final  . Lymphocytes Relative 12/15/2018 28  % Final  . Lymphs Abs 12/15/2018 1.5  0.7 - 4.0 K/uL Final  . Monocytes Relative 12/15/2018 9  % Final  . Monocytes Absolute 12/15/2018 0.5  0.1 - 1.0 K/uL Final  . Eosinophils Relative 12/15/2018 7  % Final  . Eosinophils Absolute 12/15/2018 0.4  0.0 - 0.5 K/uL Final  . Basophils Relative 12/15/2018 1  % Final  . Basophils Absolute 12/15/2018 0.0  0.0 - 0.1 K/uL Final  . Immature Granulocytes 12/15/2018 0  % Final  . Abs Immature Granulocytes 12/15/2018 0.02  0.00 - 0.07 K/uL Final   Performed at [facility], [address] [place], [place] [address]    Assessment:  Gregory Tran is a 83 y.o. male with a normocytic anemiasecondarytoiron deficiency, B12 deficiency, andanemia of chronic renal disease.   Work-up on 02/13/2020revealed a hematocrit of 29.2, hemoglobin 9.3, MCV 85.1, platelets 287,000, WBC 6600 with an ANC of 3500. Normal studiesincluded: SPEP, folate, and TSH. Free light chain ratio was 2.10 (0.26-1.65). Epo level was 52.5. B12was 231. Ferritinwas 13. Iron saturation was 10% with a TIBC of 442. Retic was 2.1%.  He hasB12 deficiency. B12 was 231 on 02/13/2020and 302 on 04/27/2018. He tried oral B12 x 2-3 days but discontinued as it made him jittery. Anti-parietal antibodies and intrinsic factor antibodies were negative on 07/28/2018.  He has iron deficiency. Diet is poor. He denies any bleeding.He has not had a colonoscopy in years. Stool was guaiac - x 3 in 03/2018. Urinalysis revealed trace  hemoglobin on 04/15/2018. He has ice pica.  He received Venofer weekly x 3 (06/24/2018 - 07/08/2018).   Ferritin has been followed: 13 on 04/01/2018, 12 on 06/23/2018, 91 on 07/28/2018, 72 on 08/09/2018, 57 on 10/04/2018 and 28 on 12/15/2018.   He has chronic renal insufficiency. Creatinine was 1.5 on 04/15/2018 (CrCl 43 ml/min).  Symptomatically, he is doing "ok".  Exam is stable.  Plan: 1.   Review labs on 12/15/2018. 2.Iron deficiency anemia Hematocrit 31.5.  Hemoglobin 10.8.  MCV 91 on 08/09/2018.                Ferritin 72.  Iron saturation 24% with a TIBC of 335.               Hematocrit 32.8.  Hemoglobin 11.4.  MCV 91.9 on 12/15/2018.   Ferritin 28.  Etiology felt secondary to a poor diet. He denies any melena or hematochezia.             Guaiac cards x3 were negative. Urinalysis revealed trace hemoglobin. Follow-up with Dr. Holley Raring. Venofer today and weekly x 2 (total 3). 3.B12 deficiency B12 was 498 on 08/09/2018.    B12 goal is > 400 Continue oral B12.   4. Anemia of chronic renal disease Creatinine was 1.41 on 07/28/2018.             Ensure iron stores remain replete before initiation of Retacrit (if Hgb < 10) 5.   RTC in 3 months for labs (CBC with  diff, ferritin, Cr). 6.   RTC in 6 months for MD assessment, labs (CBC with diff, ferritin-day before), and +/- Venofer  I discussed the assessment and treatment plan with the patient.  The patient was provided an opportunity to ask questions and all were answered.  The patient agreed with the plan and demonstrated an understanding of the instructions.  The patient was advised to call back if the symptoms worsen or if the condition fails to improve as anticipated.  I provided 14 minutes of face-to-face time during this this encounter and > 50% was spent counseling as documented under my  assessment and plan.    Lequita Asal, MD, PhD    12/16/2018, 10:30 AM  I, Selena Batten, am acting as scribe for [employer]. Mike Gip, MD, PhD.  I, Melissa C. Mike Gip, MD, have reviewed the above documentation for accuracy and completeness, and I agree with the above.

## 2018-12-15 ENCOUNTER — Encounter: Payer: Self-pay | Admitting: Hematology and Oncology

## 2018-12-15 ENCOUNTER — Telehealth: Payer: Self-pay | Admitting: Hematology and Oncology

## 2018-12-15 ENCOUNTER — Inpatient Hospital Stay: Payer: Medicare Other | Attending: Hematology and Oncology

## 2018-12-15 DIAGNOSIS — Z8041 Family history of malignant neoplasm of ovary: Secondary | ICD-10-CM | POA: Diagnosis not present

## 2018-12-15 DIAGNOSIS — I252 Old myocardial infarction: Secondary | ICD-10-CM | POA: Insufficient documentation

## 2018-12-15 DIAGNOSIS — Z87891 Personal history of nicotine dependence: Secondary | ICD-10-CM | POA: Insufficient documentation

## 2018-12-15 DIAGNOSIS — Z79899 Other long term (current) drug therapy: Secondary | ICD-10-CM | POA: Insufficient documentation

## 2018-12-15 DIAGNOSIS — Z794 Long term (current) use of insulin: Secondary | ICD-10-CM | POA: Diagnosis not present

## 2018-12-15 DIAGNOSIS — Z8673 Personal history of transient ischemic attack (TIA), and cerebral infarction without residual deficits: Secondary | ICD-10-CM | POA: Insufficient documentation

## 2018-12-15 DIAGNOSIS — D631 Anemia in chronic kidney disease: Secondary | ICD-10-CM | POA: Diagnosis present

## 2018-12-15 DIAGNOSIS — R5383 Other fatigue: Secondary | ICD-10-CM | POA: Insufficient documentation

## 2018-12-15 DIAGNOSIS — E538 Deficiency of other specified B group vitamins: Secondary | ICD-10-CM | POA: Insufficient documentation

## 2018-12-15 DIAGNOSIS — K5900 Constipation, unspecified: Secondary | ICD-10-CM | POA: Insufficient documentation

## 2018-12-15 DIAGNOSIS — E1122 Type 2 diabetes mellitus with diabetic chronic kidney disease: Secondary | ICD-10-CM | POA: Diagnosis not present

## 2018-12-15 DIAGNOSIS — N189 Chronic kidney disease, unspecified: Secondary | ICD-10-CM | POA: Insufficient documentation

## 2018-12-15 DIAGNOSIS — E611 Iron deficiency: Secondary | ICD-10-CM | POA: Diagnosis not present

## 2018-12-15 DIAGNOSIS — Z841 Family history of disorders of kidney and ureter: Secondary | ICD-10-CM | POA: Diagnosis not present

## 2018-12-15 DIAGNOSIS — Z9079 Acquired absence of other genital organ(s): Secondary | ICD-10-CM | POA: Insufficient documentation

## 2018-12-15 DIAGNOSIS — D509 Iron deficiency anemia, unspecified: Secondary | ICD-10-CM

## 2018-12-15 LAB — FERRITIN: Ferritin: 28 ng/mL (ref 24–336)

## 2018-12-15 LAB — CBC WITH DIFFERENTIAL/PLATELET
Abs Immature Granulocytes: 0.02 10*3/uL (ref 0.00–0.07)
Basophils Absolute: 0 10*3/uL (ref 0.0–0.1)
Basophils Relative: 1 %
Eosinophils Absolute: 0.4 10*3/uL (ref 0.0–0.5)
Eosinophils Relative: 7 %
HCT: 32.9 % — ABNORMAL LOW (ref 39.0–52.0)
Hemoglobin: 11.4 g/dL — ABNORMAL LOW (ref 13.0–17.0)
Immature Granulocytes: 0 %
Lymphocytes Relative: 28 %
Lymphs Abs: 1.5 10*3/uL (ref 0.7–4.0)
MCH: 31.8 pg (ref 26.0–34.0)
MCHC: 34.7 g/dL (ref 30.0–36.0)
MCV: 91.9 fL (ref 80.0–100.0)
Monocytes Absolute: 0.5 10*3/uL (ref 0.1–1.0)
Monocytes Relative: 9 %
Neutro Abs: 2.9 10*3/uL (ref 1.7–7.7)
Neutrophils Relative %: 55 %
Platelets: 251 10*3/uL (ref 150–400)
RBC: 3.58 MIL/uL — ABNORMAL LOW (ref 4.22–5.81)
RDW: 13.8 % (ref 11.5–15.5)
WBC: 5.3 10*3/uL (ref 4.0–10.5)
nRBC: 0 % (ref 0.0–0.2)

## 2018-12-15 NOTE — Progress Notes (Signed)
No new changes noted today. The patient Name and DOB has been verified by phone. 

## 2018-12-16 ENCOUNTER — Inpatient Hospital Stay (HOSPITAL_BASED_OUTPATIENT_CLINIC_OR_DEPARTMENT_OTHER): Payer: Medicare Other | Admitting: Hematology and Oncology

## 2018-12-16 ENCOUNTER — Other Ambulatory Visit: Payer: Self-pay

## 2018-12-16 ENCOUNTER — Encounter: Payer: Self-pay | Admitting: Hematology and Oncology

## 2018-12-16 ENCOUNTER — Inpatient Hospital Stay: Payer: Medicare Other

## 2018-12-16 VITALS — BP 144/72 | HR 54 | Resp 18

## 2018-12-16 VITALS — BP 147/65 | HR 72 | Temp 97.1°F | Resp 20 | Ht 68.0 in | Wt 179.7 lb

## 2018-12-16 DIAGNOSIS — E538 Deficiency of other specified B group vitamins: Secondary | ICD-10-CM

## 2018-12-16 DIAGNOSIS — D509 Iron deficiency anemia, unspecified: Secondary | ICD-10-CM | POA: Diagnosis not present

## 2018-12-16 DIAGNOSIS — N189 Chronic kidney disease, unspecified: Secondary | ICD-10-CM | POA: Diagnosis not present

## 2018-12-16 MED ORDER — SODIUM CHLORIDE 0.9 % IV SOLN: 200.0000 mg | Freq: Once | INTRAVENOUS | Status: DC

## 2018-12-16 MED ORDER — SODIUM CHLORIDE 0.9 % IV SOLN
Freq: Once | INTRAVENOUS | Status: AC
  Administered 2018-12-16: 11:00:00 via INTRAVENOUS
  Filled 2018-12-16: qty 250

## 2018-12-16 MED ORDER — IRON SUCROSE 20 MG/ML IV SOLN
200.0000 mg | Freq: Once | INTRAVENOUS | Status: AC
  Administered 2018-12-16: 200 mg via INTRAVENOUS

## 2018-12-16 NOTE — Patient Instructions (Signed)

## 2018-12-22 ENCOUNTER — Other Ambulatory Visit: Payer: Self-pay

## 2018-12-23 ENCOUNTER — Other Ambulatory Visit: Payer: Self-pay | Admitting: Hematology and Oncology

## 2018-12-23 ENCOUNTER — Inpatient Hospital Stay: Payer: Medicare Other | Attending: Hematology and Oncology

## 2018-12-23 VITALS — BP 151/77 | HR 56 | Temp 97.5°F | Resp 15

## 2018-12-23 DIAGNOSIS — Z87891 Personal history of nicotine dependence: Secondary | ICD-10-CM | POA: Insufficient documentation

## 2018-12-23 DIAGNOSIS — D631 Anemia in chronic kidney disease: Secondary | ICD-10-CM | POA: Insufficient documentation

## 2018-12-23 DIAGNOSIS — N189 Chronic kidney disease, unspecified: Secondary | ICD-10-CM | POA: Insufficient documentation

## 2018-12-23 DIAGNOSIS — E611 Iron deficiency: Secondary | ICD-10-CM | POA: Diagnosis not present

## 2018-12-23 DIAGNOSIS — Z841 Family history of disorders of kidney and ureter: Secondary | ICD-10-CM | POA: Insufficient documentation

## 2018-12-23 DIAGNOSIS — K5900 Constipation, unspecified: Secondary | ICD-10-CM | POA: Insufficient documentation

## 2018-12-23 DIAGNOSIS — R5383 Other fatigue: Secondary | ICD-10-CM | POA: Diagnosis not present

## 2018-12-23 DIAGNOSIS — Z8041 Family history of malignant neoplasm of ovary: Secondary | ICD-10-CM | POA: Diagnosis not present

## 2018-12-23 DIAGNOSIS — R413 Other amnesia: Secondary | ICD-10-CM | POA: Insufficient documentation

## 2018-12-23 DIAGNOSIS — E538 Deficiency of other specified B group vitamins: Secondary | ICD-10-CM | POA: Insufficient documentation

## 2018-12-23 DIAGNOSIS — Z79899 Other long term (current) drug therapy: Secondary | ICD-10-CM | POA: Diagnosis not present

## 2018-12-23 DIAGNOSIS — D509 Iron deficiency anemia, unspecified: Secondary | ICD-10-CM

## 2018-12-23 MED ORDER — SODIUM CHLORIDE 0.9 % IV SOLN: 200.0000 mg | Freq: Once | INTRAVENOUS | Status: DC

## 2018-12-23 MED ORDER — SODIUM CHLORIDE 0.9 % IV SOLN
Freq: Once | INTRAVENOUS | Status: AC
  Administered 2018-12-23: 15:00:00 via INTRAVENOUS
  Filled 2018-12-23: qty 250

## 2018-12-23 MED ORDER — IRON SUCROSE 20 MG/ML IV SOLN
200.0000 mg | Freq: Once | INTRAVENOUS | Status: AC
  Administered 2018-12-23: 15:00:00 200 mg via INTRAVENOUS
  Filled 2018-12-23: qty 10

## 2018-12-23 NOTE — Patient Instructions (Signed)

## 2018-12-29 ENCOUNTER — Other Ambulatory Visit: Payer: Self-pay

## 2018-12-30 ENCOUNTER — Inpatient Hospital Stay: Payer: Medicare Other

## 2018-12-30 VITALS — BP 144/78 | HR 60 | Temp 97.9°F | Resp 16

## 2018-12-30 DIAGNOSIS — N189 Chronic kidney disease, unspecified: Secondary | ICD-10-CM | POA: Diagnosis not present

## 2018-12-30 DIAGNOSIS — D509 Iron deficiency anemia, unspecified: Secondary | ICD-10-CM

## 2018-12-30 MED ORDER — SODIUM CHLORIDE 0.9 % IV SOLN
Freq: Once | INTRAVENOUS | Status: AC
  Administered 2018-12-30: 14:00:00 via INTRAVENOUS
  Filled 2018-12-30: qty 250

## 2018-12-30 MED ORDER — IRON SUCROSE 20 MG/ML IV SOLN
200.0000 mg | Freq: Once | INTRAVENOUS | Status: AC
  Administered 2018-12-30: 200 mg via INTRAVENOUS

## 2018-12-30 MED ORDER — SODIUM CHLORIDE 0.9 % IV SOLN: 200.0000 mg | Freq: Once | INTRAVENOUS | Status: DC

## 2018-12-30 NOTE — Patient Instructions (Signed)

## 2019-03-16 ENCOUNTER — Inpatient Hospital Stay: Payer: Medicare Other | Attending: Hematology and Oncology

## 2019-04-01 IMAGING — XA DG C-ARM 61-120 MIN
1 series · 1 of 1 positions shown · non-contrast
Comparison: Lumbar spine MRI 01/21/2018

CLINICAL DATA: Kyphoplasty.

EXAM:
LUMBAR SPINE - 2-3 VIEW; DG C-ARM 61-120 MIN

[Series 1: ortho standard · 1 of 1 slices shown]
[im 1/1]
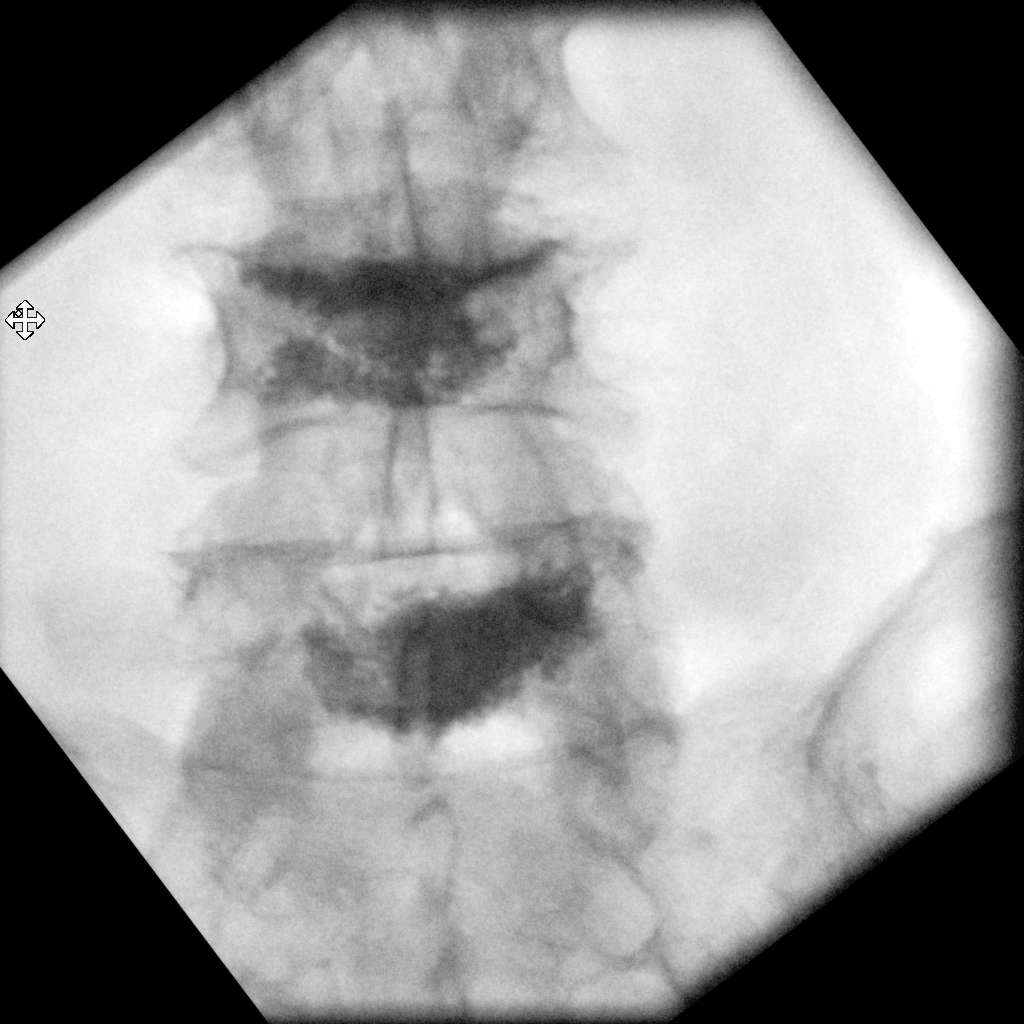

[1 of 1 positions shown; findings below may reference images not displayed]

FLUOROSCOPY TIME:  C-arm fluoroscopic images were obtained
intraoperatively and submitted for post operative interpretation.
Please see the performing provider's procedural report for the
fluoroscopy time utilized.
FINDINGS: Two intraoperative spot fluoroscopic images of the lower lumbar
spine are provided. Cement is present in the L4 and L5 vertebral
bodies.
IMPRESSION: Intraoperative images during L4 and L5 vertebral augmentation.

## 2019-06-14 NOTE — Progress Notes (Signed)
[facility]  [address], [address] [place],  [address] Phone: [phone]  Fax: [phone]   Clinic Day:  06/16/2019  Referring physician: Sallee Lange, *  Chief Complaint: Gregory Tran is a 84 y.o. male with iron deficiency anemia, B12 deficiency, andanemia of chronic kidney disease who is seen for 6 month assessment.  HPI: The patient was last seen in the hematology clinic on 12/16/2018.  At that time, he was doing "ok". Exam was stable. Hematocrit was 32.9, hemoglobin 11.4, MCV 91.9, platelets 251,000, WBC 5,300. Ferritin was 28. He continued oral B12.   Patient received Venofer weekly x 3 (12/16/2018 - 12/30/2018).   Labs on 06/15/2019 included hematocrit 33.2, hemoglobin 11.5, MCV 90.2, platelets 244,000, WBC 6,100. Ferritin was 73.  During the interim, the patient felt "about usual". He has no energy or ambition. He feels like he can sleep all day and all night. His fatigue has been going on for months now. Patient remains on oral B12. He has no desire to eat. He feels like his taste and smell have decreased. He is eating mostly fast food. He does not like to cook as much and Arbie Cookey does not cook a lot either. He has ongoing constipation.   He has occasional chest pains. He denies having a heart attack. He notes that tomorrow he will get and injection for his spinal stenosis. He likes to sit in his recliner all day and watch tv. He denies any recent falls or syncopal episodes. He feels like his short term memory is not the same. He notes the crown on his tooth broke off and he has to go get it fixed.    Past Medical History:  Diagnosis Date  . Arthritis   . BPH (benign prostatic hyperplasia)   . Cancer (HCC)    Basal Cell  . Chronic kidney disease   . Coronary artery disease   . Diabetes mellitus ([place])   . Ejaculatory disorder   . Erectile dysfunction   . Frequency   . GERD (gastroesophageal reflux disease)   . Gross  hematuria   . Heart disease   . Hematuria   . HTN (hypertension)   . Hyperlipidemia   . Incomplete bladder emptying   . Microscopic hematuria   . Myocardial infarction ([place])   . Neuropathy   . Nocturia   . Rotator cuff tear left  . TIA (transient ischemic attack)   . Tick bite of multiple sites 5 days ago   chest, and groin area  . Vertigo    1-2x/yr    Past Surgical History:  Procedure Laterality Date  . cardiac stents    . CATARACT EXTRACTION W/PHACO Left 08/02/2018   Procedure: CATARACT EXTRACTION PHACO AND INTRAOCULAR LENS PLACEMENT ([place])  LEFT DIABETIC;  Surgeon: Eulogio Bear, MD;  Location: [place];  Service: Ophthalmology;  Laterality: Left;  diabetes - insulin and oral meds  . CATARACT EXTRACTION W/PHACO Right 10/11/2018   Procedure: CATARACT EXTRACTION PHACO AND INTRAOCULAR LENS PLACEMENT ([place]) RIGHT DIABETES;  Surgeon: Eulogio Bear, MD;  Location: [place];  Service: Ophthalmology;  Laterality: Right;  Diabetic - insulin and oral meds  . CORONARY ANGIOPLASTY  2001, 2008   [facility]  . DUPUYTREN CONTRACTURE RELEASE Right 04/30/2016   Procedure: DUPUYTREN CONTRACTURE RELEASE;  Surgeon: Leanor Kail, MD;  Location: [facility];  Service: Orthopedics;  Laterality: Right;  . KYPHOPLASTY N/A 01/26/2018   Procedure: Elwanda [place];  Surgeon: Hessie Knows, MD;  Location:  [facility];  Service: Orthopedics;  Laterality: N/A;  . ROTATOR CUFF REPAIR Bilateral   . TRANSURETHRAL RESECTION OF PROSTATE N/A 07/09/2015   Procedure: TRANSURETHRAL RESECTION OF THE PROSTATE (TURP);  Surgeon: Hollice Espy, MD;  Location: [facility];  Service: Urology;  Laterality: N/A;    Family History  Problem Relation Age of Onset  . Ovarian cancer Sister   . Kidney disease Brother        born one kidney  . Bladder Cancer Neg Hx   . Prostate cancer Neg Hx     Social History:  reports that he quit smoking about 1 years ago. His smoking use included cigarettes. He has a  50.00 pack-year smoking history. He has never used smokeless tobacco. He reports that he does not drink alcohol or use drugs. His significant otheris [employer] He lives in [place]. The patient is alone today.  Allergies:  Allergies  Allergen Reactions  . Atorvastatin Other (See Comments)  . B Complex Formula 1 Hives    Current Medications: Current Outpatient Medications  Medication Sig Dispense Refill  . acetaminophen (TYLENOL) 650 MG CR tablet Take 650 mg by mouth every 8 (eight) hours as needed for pain.    Marland Kitchen amLODipine (NORVASC) 5 MG tablet TAKE 1 TABLET BY MOUTH DAILY  4  . aspirin EC 81 MG tablet Take 81 mg by mouth daily.     . cetirizine (ZYRTEC) 10 MG tablet Take 10 mg by mouth daily as needed for allergies.    . chlorthalidone (HYGROTON) 50 MG tablet Take 1 tablet by mouth daily.    . Cyanocobalamin (VITAMIN B-12 PO) Take by mouth daily.    Marland Kitchen docusate sodium (COLACE) 250 MG capsule Take 1 tablet by mouth 2 (two) times daily.    Marland Kitchen glucose blood (ONE TOUCH ULTRA TEST) test strip U 1 STRIP VIA METER 2 TO 3 XD UTD IN A ROTATING PATTERN    . hydrOXYzine (ATARAX/VISTARIL) 50 MG tablet Take 1 tablet by mouth 2 (two) times daily.    . Insulin Pen Needle (FIFTY50 PEN NEEDLES) 32G X 4 MM MISC by Does not apply route.    Marland Kitchen LEVEMIR FLEXTOUCH 100 UNIT/ML Pen 55 Units.   3  . metFORMIN (GLUCOPHAGE) 1000 MG tablet Take 1,000 mg by mouth 2 (two) times daily with a meal.     . metoprolol succinate (TOPROL-XL) 50 MG 24 hr tablet Take 25 mg by mouth daily.     Marland Kitchen NOVOLOG FLEXPEN 100 UNIT/ML FlexPen Inject 22 Units into the skin 3 (three) times daily with meals.   3  . pantoprazole (PROTONIX) 40 MG tablet Take 40 mg by mouth daily.     . rosuvastatin (CRESTOR) 5 MG tablet Take 5 mg by mouth every other day.   1  . Selenium 100 MCG TABS Take 1 tablet by mouth daily.     Marland Kitchen senna-docusate (SENOKOT-S) 8.6-50 MG tablet Take by mouth.    . tamsulosin (FLOMAX) 0.4 MG CAPS capsule TAKE 1 CAPSULE(0.4  MG) BY MOUTH DAILY 30 capsule 3  . triamcinolone ointment (KENALOG) 0.5 % Apply 1 application topically 2 (two) times daily as needed.    . Iron-Vitamin C 65-125 MG TABS Take 1 tablet by mouth 2 (two) times daily. (Patient not taking: Reported on 10/04/2018) 60 tablet 1   No current facility-administered medications for this visit.    Review of Systems  Constitutional: Positive for malaise/fatigue (no energy; sleeping all day). Negative for chills, fever and weight loss (  stable).       Feels "about usual". Cane by his side.  HENT: Negative.  Negative for congestion, ear pain, hearing loss, nosebleeds, sinus pain and sore throat.        Decreased taste and smell. Broken crown on tooth.  Eyes: Negative for blurred vision and double vision.       S/p cataract surgery (right eye).  Respiratory: Negative.  Negative for cough, hemoptysis, shortness of breath and wheezing.   Cardiovascular: Positive for chest pain (occasional). Negative for palpitations, orthopnea, leg swelling and PND.  Gastrointestinal: Positive for constipation (ongoing; on stool softeners). Negative for abdominal pain, diarrhea, heartburn, melena, nausea and vomiting.       No desire to eat.  Genitourinary: Negative.  Negative for dysuria, frequency, hematuria and urgency.  Musculoskeletal: Negative for back pain, falls, joint pain and myalgias.       Spinal stenosis.  Skin: Negative.  Negative for itching and rash.  Neurological: Negative for dizziness, tingling, sensory change, focal weakness, weakness and headaches.  Endo/Heme/Allergies: Negative.  Does not bruise/bleed easily.  Psychiatric/Behavioral: Positive for memory loss ("my brain goes squirrely"). Negative for depression. The patient is not nervous/anxious and does not have insomnia.   All other systems reviewed and are negative.  Performance status (ECOG): 2  Vitals Blood pressure (!) 155/74, pulse 80, temperature 97.9 F (36.6 C), temperature source Tympanic,  resp. rate 18, weight 178 lb (80.7 kg), SpO2 96 %.  Physical Exam  Constitutional: He is oriented to person, place, and time. He appears well-developed and well-nourished. No distress.  HENT:  Head: Normocephalic and atraumatic.  Mouth/Throat: Oropharynx is clear and moist. No oropharyngeal exudate.  Gray hair, mustache, and beard.  Male pattern baldness.  Mask.  Eyes: Pupils are equal, round, and reactive to light. Conjunctivae are normal. No scleral icterus.  Glasses.  Blue eyes.  Neck: No JVD present.  Cardiovascular: Normal rate, regular rhythm and normal heart sounds. Exam reveals no gallop and no friction rub.  No murmur heard. Pulmonary/Chest: Effort normal and breath sounds normal. No respiratory distress. He has no wheezes. He has no rales.  Abdominal: Soft. Bowel sounds are normal. He exhibits no distension and no mass. There is no abdominal tenderness. There is no rebound and no guarding.  Musculoskeletal:        General: No tenderness or edema. Normal range of motion.     Cervical back: Normal range of motion and neck supple.     Comments: Dupuytren's contracture.  Lymphadenopathy:    He has no cervical adenopathy.    He has no axillary adenopathy.       Right: No supraclavicular adenopathy present.       Left: No supraclavicular adenopathy present.  Neurological: He is alert and oriented to person, place, and time.  Skin: Skin is warm and dry. No rash noted. He is not diaphoretic. No erythema. No pallor.  Psychiatric: He has a normal mood and affect. His behavior is normal. Judgment and thought content normal.  Nursing note and vitals reviewed.   Appointment on 06/15/2019  Component Date Value Ref Range Status  . Ferritin 06/15/2019 73  24 - 336 ng/mL Final   Performed at [facility], [address] [place], [place] [address]  . WBC 06/15/2019 6.1  4.0 - 10.5 K/uL Final  . RBC 06/15/2019 3.68* 4.22 - 5.81 MIL/uL Final  . Hemoglobin 06/15/2019 11.5* 13.0 -  17.0 g/dL Final  . HCT 06/15/2019 33.2* 39.0 - 52.0 %  Final  . MCV 06/15/2019 90.2  80.0 - 100.0 fL Final  . MCH 06/15/2019 31.3  26.0 - 34.0 pg Final  . MCHC 06/15/2019 34.6  30.0 - 36.0 g/dL Final  . RDW 06/15/2019 13.5  11.5 - 15.5 % Final  . Platelets 06/15/2019 244  150 - 400 K/uL Final  . nRBC 06/15/2019 0.3* 0.0 - 0.2 % Final  . Neutrophils Relative % 06/15/2019 51  % Final  . Neutro Abs 06/15/2019 3.2  1.7 - 7.7 K/uL Final  . Lymphocytes Relative 06/15/2019 32  % Final  . Lymphs Abs 06/15/2019 1.9  0.7 - 4.0 K/uL Final  . Monocytes Relative 06/15/2019 8  % Final  . Monocytes Absolute 06/15/2019 0.5  0.1 - 1.0 K/uL Final  . Eosinophils Relative 06/15/2019 7  % Final  . Eosinophils Absolute 06/15/2019 0.4  0.0 - 0.5 K/uL Final  . Basophils Relative 06/15/2019 1  % Final  . Basophils Absolute 06/15/2019 0.0  0.0 - 0.1 K/uL Final  . Immature Granulocytes 06/15/2019 1  % Final  . Abs Immature Granulocytes 06/15/2019 0.03  0.00 - 0.07 K/uL Final   Performed at [facility], [address] [place], [place] [address]    Assessment:  Gregory Tran is a 84 y.o. male with a normocytic anemiasecondarytoiron deficiency, B12 deficiency, andanemia of chronic renal disease.   Work-up on 02/13/2020revealed a hematocrit of 29.2, hemoglobin 9.3, MCV 85.1, platelets 287,000, WBC 6600 with an ANC of 3500. Normal studiesincluded: SPEP, folate, and TSH. Free light chain ratio was 2.10 (0.26-1.65). Epo level was 52.5. B12was 231. Ferritinwas 13. Iron saturation was 10% with a TIBC of 442. Retic was 2.1%.  He hasB12 deficiency. B12 was 231 on 02/13/2020and 498 on 08/09/2018. He tried oral B12 x 2-3 days but discontinued as it made him jittery. Anti-parietal antibodies and intrinsic factor antibodies were negative on 07/28/2018.  He has iron deficiency. Diet is poor. He denies any bleeding.He has not had a colonoscopy in years. Stool was guaiac - x 3 in  03/2018. Urinalysis revealed trace hemoglobin on 04/15/2018. He has ice pica.  He received Venofer weekly x 3 (06/24/2018 - 07/08/2018) and weekly x 3 (12/16/2018 - 12/30/2018).   Ferritin has been followed: 13 on 04/01/2018, 12 on 06/23/2018, 91 on 07/28/2018, 72 on 08/09/2018, 57 on 10/04/2018, 28 on 12/15/2018, and 73 on 06/15/2019.   He has chronic renal insufficiency. Creatinine was 1.5 on 04/15/2018 (CrCl 43 ml/min).  Symptomatically, he notes chronic fatigue for months.  Exam is stable.  Plan: 1.   Review labs from 06/15/2019. 2.Iron deficiency anemia             Hematocrit 32.8.  Hemoglobin 11.4.  MCV 91.9 on 12/15/2018.   Ferritin 28.  Hematocrit 33.2.  Hemoglobin 11.5.  MCV 90.2 on 06/15/2019.                Ferritin 73.  He received Venofer (last 12/30/2018).  He has a poor diet.  He eats fast food. He denies any melena or hematochezia.             Guaiac cards x3 were negative. Urinalysis revealed trace hemoglobin. Continue to monitor. 3.B12 deficiency B12 was 498 on 08/09/2018.    B12 goal is > 400 Continue oral B12.   4. Anemia of chronic renal disease Creatinine was 1.41 on 07/28/2018.             Initiate Retacrit if hemoglobin< 10 and all deficiencies corrected.  5.   RTC in 3 months for labs (CBC with diff, ferritin). 6.   RTC in 6 months for MD assessment, labs (CBC with diff, ferritin-day before), and +/- Venofer.  I discussed the assessment and treatment plan with the patient.  The patient was provided an opportunity to ask questions and all were answered.  The patient agreed with the plan and demonstrated an understanding of the instructions.  The patient was advised to call back if the symptoms worsen or if the condition fails to improve as anticipated.   Lequita Asal, MD, PhD    06/16/2019, 11:17 AM  I, Selena Batten, am acting as scribe for [employer]. Mike Gip, MD, PhD.  I, Yosgart Pavey C. Mike Gip, MD, have reviewed the above documentation for accuracy and completeness, and I agree with the above.

## 2019-06-15 ENCOUNTER — Other Ambulatory Visit: Payer: Self-pay

## 2019-06-15 ENCOUNTER — Inpatient Hospital Stay: Payer: Medicare Other | Attending: Hematology and Oncology

## 2019-06-15 DIAGNOSIS — D509 Iron deficiency anemia, unspecified: Secondary | ICD-10-CM | POA: Insufficient documentation

## 2019-06-15 LAB — CBC WITH DIFFERENTIAL/PLATELET
Abs Immature Granulocytes: 0.03 10*3/uL (ref 0.00–0.07)
Basophils Absolute: 0 10*3/uL (ref 0.0–0.1)
Basophils Relative: 1 %
Eosinophils Absolute: 0.4 10*3/uL (ref 0.0–0.5)
Eosinophils Relative: 7 %
HCT: 33.2 % — ABNORMAL LOW (ref 39.0–52.0)
Hemoglobin: 11.5 g/dL — ABNORMAL LOW (ref 13.0–17.0)
Immature Granulocytes: 1 %
Lymphocytes Relative: 32 %
Lymphs Abs: 1.9 10*3/uL (ref 0.7–4.0)
MCH: 31.3 pg (ref 26.0–34.0)
MCHC: 34.6 g/dL (ref 30.0–36.0)
MCV: 90.2 fL (ref 80.0–100.0)
Monocytes Absolute: 0.5 10*3/uL (ref 0.1–1.0)
Monocytes Relative: 8 %
Neutro Abs: 3.2 10*3/uL (ref 1.7–7.7)
Neutrophils Relative %: 51 %
Platelets: 244 10*3/uL (ref 150–400)
RBC: 3.68 MIL/uL — ABNORMAL LOW (ref 4.22–5.81)
RDW: 13.5 % (ref 11.5–15.5)
WBC: 6.1 10*3/uL (ref 4.0–10.5)
nRBC: 0.3 % — ABNORMAL HIGH (ref 0.0–0.2)

## 2019-06-15 LAB — FERRITIN: Ferritin: 73 ng/mL (ref 24–336)

## 2019-06-16 ENCOUNTER — Inpatient Hospital Stay: Payer: Medicare Other

## 2019-06-16 ENCOUNTER — Encounter: Payer: Self-pay | Admitting: Hematology and Oncology

## 2019-06-16 ENCOUNTER — Inpatient Hospital Stay (HOSPITAL_BASED_OUTPATIENT_CLINIC_OR_DEPARTMENT_OTHER): Payer: Medicare Other | Admitting: Hematology and Oncology

## 2019-06-16 VITALS — BP 155/74 | HR 80 | Temp 97.9°F | Resp 18 | Wt 178.0 lb

## 2019-06-16 DIAGNOSIS — D509 Iron deficiency anemia, unspecified: Secondary | ICD-10-CM

## 2019-06-16 DIAGNOSIS — E538 Deficiency of other specified B group vitamins: Secondary | ICD-10-CM

## 2019-06-16 NOTE — Progress Notes (Signed)
No new changes noted today 

## 2019-08-11 ENCOUNTER — Other Ambulatory Visit: Payer: Self-pay | Admitting: Physical Medicine and Rehabilitation

## 2019-08-11 DIAGNOSIS — M5416 Radiculopathy, lumbar region: Secondary | ICD-10-CM

## 2019-08-21 ENCOUNTER — Other Ambulatory Visit: Payer: Self-pay

## 2019-08-21 ENCOUNTER — Ambulatory Visit
Admission: RE | Admit: 2019-08-21 | Discharge: 2019-08-21 | Disposition: A | Discharge status: Home / Self Care | Payer: Medicare Other | Source: Ambulatory Visit | Attending: Physical Medicine and Rehabilitation | Admitting: Physical Medicine and Rehabilitation

## 2019-08-21 DIAGNOSIS — M5416 Radiculopathy, lumbar region: Secondary | ICD-10-CM

## 2019-09-15 ENCOUNTER — Inpatient Hospital Stay: Payer: Medicare Other | Attending: Hematology and Oncology

## 2019-09-15 ENCOUNTER — Other Ambulatory Visit: Payer: Self-pay

## 2019-09-15 DIAGNOSIS — D509 Iron deficiency anemia, unspecified: Secondary | ICD-10-CM | POA: Diagnosis not present

## 2019-09-15 LAB — CBC WITH DIFFERENTIAL/PLATELET
Abs Immature Granulocytes: 0.07 10*3/uL (ref 0.00–0.07)
Basophils Absolute: 0.1 10*3/uL (ref 0.0–0.1)
Basophils Relative: 1 %
Eosinophils Absolute: 0.7 10*3/uL — ABNORMAL HIGH (ref 0.0–0.5)
Eosinophils Relative: 11 %
HCT: 33.4 % — ABNORMAL LOW (ref 39.0–52.0)
Hemoglobin: 11.7 g/dL — ABNORMAL LOW (ref 13.0–17.0)
Immature Granulocytes: 1 %
Lymphocytes Relative: 28 %
Lymphs Abs: 1.8 10*3/uL (ref 0.7–4.0)
MCH: 31.7 pg (ref 26.0–34.0)
MCHC: 35 g/dL (ref 30.0–36.0)
MCV: 90.5 fL (ref 80.0–100.0)
Monocytes Absolute: 0.6 10*3/uL (ref 0.1–1.0)
Monocytes Relative: 9 %
Neutro Abs: 3.3 10*3/uL (ref 1.7–7.7)
Neutrophils Relative %: 50 %
Platelets: 287 10*3/uL (ref 150–400)
RBC: 3.69 MIL/uL — ABNORMAL LOW (ref 4.22–5.81)
RDW: 14.1 % (ref 11.5–15.5)
WBC: 6.6 10*3/uL (ref 4.0–10.5)
nRBC: 0 % (ref 0.0–0.2)

## 2019-09-15 LAB — FERRITIN: Ferritin: 67 ng/mL (ref 24–336)

## 2019-10-06 ENCOUNTER — Other Ambulatory Visit (HOSPITAL_COMMUNITY): Payer: Self-pay | Admitting: Family Medicine

## 2019-12-14 ENCOUNTER — Inpatient Hospital Stay: Payer: Medicare Other | Attending: Hematology and Oncology

## 2019-12-14 ENCOUNTER — Other Ambulatory Visit: Payer: Self-pay

## 2019-12-14 ENCOUNTER — Other Ambulatory Visit: Payer: Medicare Other

## 2019-12-14 DIAGNOSIS — R5383 Other fatigue: Secondary | ICD-10-CM | POA: Insufficient documentation

## 2019-12-14 DIAGNOSIS — E538 Deficiency of other specified B group vitamins: Secondary | ICD-10-CM | POA: Insufficient documentation

## 2019-12-14 DIAGNOSIS — D509 Iron deficiency anemia, unspecified: Secondary | ICD-10-CM

## 2019-12-14 LAB — CBC WITH DIFFERENTIAL/PLATELET
Abs Immature Granulocytes: 0.04 10*3/uL (ref 0.00–0.07)
Basophils Absolute: 0 10*3/uL (ref 0.0–0.1)
Basophils Relative: 1 %
Eosinophils Absolute: 0.4 10*3/uL (ref 0.0–0.5)
Eosinophils Relative: 8 %
HCT: 32 % — ABNORMAL LOW (ref 39.0–52.0)
Hemoglobin: 11 g/dL — ABNORMAL LOW (ref 13.0–17.0)
Immature Granulocytes: 1 %
Lymphocytes Relative: 29 %
Lymphs Abs: 1.6 10*3/uL (ref 0.7–4.0)
MCH: 32.4 pg (ref 26.0–34.0)
MCHC: 34.4 g/dL (ref 30.0–36.0)
MCV: 94.1 fL (ref 80.0–100.0)
Monocytes Absolute: 0.4 10*3/uL (ref 0.1–1.0)
Monocytes Relative: 7 %
Neutro Abs: 3 10*3/uL (ref 1.7–7.7)
Neutrophils Relative %: 54 %
Platelets: 265 10*3/uL (ref 150–400)
RBC: 3.4 MIL/uL — ABNORMAL LOW (ref 4.22–5.81)
RDW: 14 % (ref 11.5–15.5)
WBC: 5.5 10*3/uL (ref 4.0–10.5)
nRBC: 0.4 % — ABNORMAL HIGH (ref 0.0–0.2)

## 2019-12-14 LAB — FERRITIN: Ferritin: 34 ng/mL (ref 24–336)

## 2019-12-14 NOTE — Progress Notes (Signed)
[facility]  [address], [address] [place], [place] [address] Phone: [phone]  Fax: [phone]   Clinic Day:  12/15/2019  Referring physician: Sallee Lange, *  Chief Complaint: Gregory Tran is a 84 y.o. male with iron deficiency anemia, B12 deficiency, andanemia of chronic kidney disease who is seen for 6 month assessment.  HPI: The patient was last seen in the hematology clinic on 06/16/2019.  At that time, he noted chronic fatigue for months.  Exam was stable. Hematocrit was 33.2, hemoglobin 11.5, MCV 90.2, platelets 244,000, WBC 6,100. Ferritin was 73. He continued oral B12.  Labs on 09/15/2019 revealed a hematocrit of 33.4, hemoglobin 11.7, MCV was 90.5, platelets 287,000, WBC 6,600. Ferritin was 67.  During the interim, he has been fine. He states that he sleeps all the time, which is normal for him. Whenever he sits down, he falls asleep. He is eating well and takes Vitamin B12. He reports constipation. He denies blood in the stool, black stools, and hematuria. His chest pain has resolved. He needs a tooth implant.   The patient reports occasional dizziness. A few months ago, he was drinking water while standing and fainted. He has not had any similar episodes since then.  He reports short term memory loss. He is still able to drive without any problems.  The patient has not seen Dr. Holley Raring recently.  He agrees to IV iron today.   Past Medical History:  Diagnosis Date  . Arthritis   . BPH (benign prostatic hyperplasia)   . Cancer (HCC)    Basal Cell  . Chronic kidney disease   . Coronary artery disease   . Diabetes mellitus ([place])   . Ejaculatory disorder   . Erectile dysfunction   . Frequency   . GERD (gastroesophageal reflux disease)   . Gross hematuria   . Heart disease   . Hematuria   . HTN (hypertension)   . Hyperlipidemia   . Incomplete bladder emptying   . Microscopic hematuria   . Myocardial infarction ([place])    . Neuropathy   . Nocturia   . Rotator cuff tear left  . TIA (transient ischemic attack)   . Tick bite of multiple sites 5 days ago   chest, and groin area  . Vertigo    1-2x/yr    Past Surgical History:  Procedure Laterality Date  . cardiac stents    . CATARACT EXTRACTION W/PHACO Left 08/02/2018   Procedure: CATARACT EXTRACTION PHACO AND INTRAOCULAR LENS PLACEMENT ([place])  LEFT DIABETIC;  Surgeon: Eulogio Bear, MD;  Location: [facility];  Service: Ophthalmology;  Laterality: Left;  diabetes - insulin and oral meds  . CATARACT EXTRACTION W/PHACO Right 10/11/2018   Procedure: CATARACT EXTRACTION PHACO AND INTRAOCULAR LENS PLACEMENT ([place]) RIGHT DIABETES;  Surgeon: Eulogio Bear, MD;  Location: [facility];  Service: Ophthalmology;  Laterality: Right;  Diabetic - insulin and oral meds  . CORONARY ANGIOPLASTY  2001, 2008   [facility]  . DUPUYTREN CONTRACTURE RELEASE Right 04/30/2016   Procedure: DUPUYTREN CONTRACTURE RELEASE;  Surgeon: Leanor Kail, MD;  Location: [facility];  Service: Orthopedics;  Laterality: Right;  . KYPHOPLASTY N/A 01/26/2018   Procedure: Elwanda [place];  Surgeon: Hessie Knows, MD;  Location: [facility];  Service: Orthopedics;  Laterality: N/A;  . ROTATOR CUFF REPAIR Bilateral   . TRANSURETHRAL RESECTION OF PROSTATE N/A 07/09/2015   Procedure: TRANSURETHRAL RESECTION OF THE PROSTATE (TURP);  Surgeon: Hollice Espy, MD;  Location: [facility];  Service:  Urology;  Laterality: N/A;    Family History  Problem Relation Age of Onset  . Ovarian cancer Sister   . Kidney disease Brother        born one kidney  . Bladder Cancer Neg Hx   . Prostate cancer Neg Hx     Social History:  reports that he quit smoking about 2 years ago. His smoking use included cigarettes. He has a 50.00 pack-year smoking history. He has never used smokeless tobacco. He reports that he does not drink alcohol and does not use drugs. His significant otheris [employer] He lives  in [place]. The patient is alone today.  Allergies:  Allergies  Allergen Reactions  . Atorvastatin Other (See Comments)  . B Complex-Folic Acid Hives    Current Medications: Current Outpatient Medications  Medication Sig Dispense Refill  . acetaminophen (TYLENOL) 650 MG CR tablet Take 650 mg by mouth every 8 (eight) hours as needed for pain.    Marland Kitchen amLODipine (NORVASC) 5 MG tablet TAKE 1 TABLET BY MOUTH DAILY  4  . aspirin EC 81 MG tablet Take 81 mg by mouth daily.     . cetirizine (ZYRTEC) 10 MG tablet Take 10 mg by mouth daily as needed for allergies.    . chlorthalidone (HYGROTON) 50 MG tablet Take 1 tablet by mouth daily.    . Cyanocobalamin (VITAMIN B-12 PO) Take by mouth daily.    Marland Kitchen docusate sodium (COLACE) 250 MG capsule Take 1 tablet by mouth 2 (two) times daily.    Marland Kitchen glucose blood (ONE TOUCH ULTRA TEST) test strip U 1 STRIP VIA METER 2 TO 3 XD UTD IN A ROTATING PATTERN    . hydrOXYzine (ATARAX/VISTARIL) 50 MG tablet Take 1 tablet by mouth 2 (two) times daily.    . Insulin Pen Needle (FIFTY50 PEN NEEDLES) 32G X 4 MM MISC by Does not apply route.    Marland Kitchen LEVEMIR FLEXTOUCH 100 UNIT/ML Pen 55 Units.   3  . metFORMIN (GLUCOPHAGE) 1000 MG tablet Take 1,000 mg by mouth 2 (two) times daily with a meal.     . metoprolol succinate (TOPROL-XL) 50 MG 24 hr tablet Take 25 mg by mouth daily.     Marland Kitchen NOVOLOG FLEXPEN 100 UNIT/ML FlexPen Inject 22 Units into the skin 3 (three) times daily with meals.   3  . pantoprazole (PROTONIX) 40 MG tablet Take 40 mg by mouth daily.     . rosuvastatin (CRESTOR) 5 MG tablet Take 5 mg by mouth every other day.   1  . Selenium 100 MCG TABS Take 1 tablet by mouth daily.     Marland Kitchen senna-docusate (SENOKOT-S) 8.6-50 MG tablet Take by mouth.    . tamsulosin (FLOMAX) 0.4 MG CAPS capsule TAKE 1 CAPSULE(0.4 MG) BY MOUTH DAILY 30 capsule 3  . triamcinolone ointment (KENALOG) 0.5 % Apply 1 application topically 2 (two) times daily as needed.    . Iron-Vitamin C 65-125 MG TABS  Take 1 tablet by mouth 2 (two) times daily. (Patient not taking: Reported on 10/04/2018) 60 tablet 1   No current facility-administered medications for this visit.    Review of Systems  Constitutional: Positive for malaise/fatigue (sleeping all day). Negative for chills, diaphoresis, fever and weight loss (up 8 lbs).  HENT: Negative.  Negative for congestion, ear discharge, ear pain, hearing loss, nosebleeds, sinus pain, sore throat and tinnitus.        Needs tooth implant.  Eyes: Negative for blurred vision and double vision.  S/p cataract surgery (right eye).  Respiratory: Negative.  Negative for cough, hemoptysis, shortness of breath and wheezing.   Cardiovascular: Negative.  Negative for chest pain, palpitations and leg swelling.  Gastrointestinal: Positive for constipation (ongoing; on stool softeners). Negative for abdominal pain, diarrhea, heartburn, melena, nausea and vomiting.       Eating well. Good appetite.  Genitourinary: Negative.  Negative for dysuria, frequency, hematuria and urgency.  Musculoskeletal: Negative for back pain, joint pain, myalgias and neck pain.       Spinal stenosis.  Skin: Negative.  Negative for itching and rash.  Neurological: Positive for dizziness and loss of consciousness (several months ago). Negative for tingling, sensory change, focal weakness, weakness and headaches.  Endo/Heme/Allergies: Bruises/bleeds easily.  Psychiatric/Behavioral: Positive for memory loss ("my brain goes squirrely"). Negative for depression. The patient is not nervous/anxious and does not have insomnia.   All other systems reviewed and are negative.  Performance status (ECOG): 1  Vitals Blood pressure (!) 143/59, pulse (!) 57, temperature (!) 95.8 F (35.4 C), temperature source Tympanic, weight 186 lb 2.9 oz (84.5 kg), SpO2 99 %.  Physical Exam Vitals and nursing note reviewed.  Constitutional:      General: He is not in acute distress.    Appearance: He is  well-developed. He is not diaphoretic.  HENT:     Head: Normocephalic and atraumatic.     Comments: Gray hair.    Mouth/Throat:     Mouth: Mucous membranes are moist.     Pharynx: Oropharynx is clear. No oropharyngeal exudate.  Eyes:     General: No scleral icterus.    Extraocular Movements: Extraocular movements intact.     Conjunctiva/sclera: Conjunctivae normal.     Pupils: Pupils are equal, round, and reactive to light.     Comments: Glasses.  Blue eyes.  Neck:     Vascular: No JVD.  Cardiovascular:     Rate and Rhythm: Normal rate and regular rhythm.     Heart sounds: Normal heart sounds. No murmur heard.  No friction rub. No gallop.   Pulmonary:     Effort: Pulmonary effort is normal. No respiratory distress.     Breath sounds: Normal breath sounds. No wheezing or rales.  Abdominal:     General: Bowel sounds are normal. There is no distension.     Palpations: Abdomen is soft. There is no mass.     Tenderness: There is no abdominal tenderness. There is no guarding or rebound.  Musculoskeletal:        General: No swelling or tenderness. Normal range of motion.     Cervical back: Normal range of motion and neck supple.     Right lower leg: No edema.     Left lower leg: No edema.  Lymphadenopathy:     Head:     Right side of head: No preauricular, posterior auricular or occipital adenopathy.     Left side of head: No preauricular, posterior auricular or occipital adenopathy.     Cervical: No cervical adenopathy.     Upper Body:     Right upper body: No supraclavicular or axillary adenopathy.     Left upper body: No supraclavicular or axillary adenopathy.     Lower Body: No right inguinal adenopathy. No left inguinal adenopathy.  Skin:    General: Skin is warm and dry.     Coloration: Skin is not pale.     Findings: No erythema or rash.     Comments: Bruise on left  hand and on abdomen.  Neurological:     Mental Status: He is alert and oriented to person, place, and  time.  Psychiatric:        Behavior: Behavior normal.        Thought Content: Thought content normal.        Judgment: Judgment normal.    Appointment on 12/14/2019  Component Date Value Ref Range Status  . Ferritin 12/14/2019 34  24 - 336 ng/mL Final   Performed at [facility], [address] [place], [place] [address]  . WBC 12/14/2019 5.5  4.0 - 10.5 K/uL Final  . RBC 12/14/2019 3.40* 4.22 - 5.81 MIL/uL Final  . Hemoglobin 12/14/2019 11.0* 13.0 - 17.0 g/dL Final  . HCT 12/14/2019 32.0* 39 - 52 % Final  . MCV 12/14/2019 94.1  80.0 - 100.0 fL Final  . MCH 12/14/2019 32.4  26.0 - 34.0 pg Final  . MCHC 12/14/2019 34.4  30.0 - 36.0 g/dL Final  . RDW 12/14/2019 14.0  11.5 - 15.5 % Final  . Platelets 12/14/2019 265  150 - 400 K/uL Final  . nRBC 12/14/2019 0.4* 0.0 - 0.2 % Final  . Neutrophils Relative % 12/14/2019 54  % Final  . Neutro Abs 12/14/2019 3.0  1.7 - 7.7 K/uL Final  . Lymphocytes Relative 12/14/2019 29  % Final  . Lymphs Abs 12/14/2019 1.6  0.7 - 4.0 K/uL Final  . Monocytes Relative 12/14/2019 7  % Final  . Monocytes Absolute 12/14/2019 0.4  0.1 - 1.0 K/uL Final  . Eosinophils Relative 12/14/2019 8  % Final  . Eosinophils Absolute 12/14/2019 0.4  0.0 - 0.5 K/uL Final  . Basophils Relative 12/14/2019 1  % Final  . Basophils Absolute 12/14/2019 0.0  0.0 - 0.1 K/uL Final  . Immature Granulocytes 12/14/2019 1  % Final  . Abs Immature Granulocytes 12/14/2019 0.04  0.00 - 0.07 K/uL Final   Performed at [facility], [address] [place], [place] [address]    Assessment:  Gregory Tran is a 84 y.o. male with a normocytic anemiasecondarytoiron deficiency, B12 deficiency, andanemia of chronic renal disease.   Work-up on 02/13/2020revealed a hematocrit of 29.2, hemoglobin 9.3, MCV 85.1, platelets 287,000, WBC 6600 with an ANC of 3500. Normal studiesincluded: SPEP, folate, and TSH. Free light chain ratio was 2.10 (0.26-1.65). Epo level was  52.5. B12was 231. Ferritinwas 13. Iron saturation was 10% with a TIBC of 442. Retic was 2.1%.  He hasB12 deficiency. B12 was 231 on 02/13/2020and 498 on 08/09/2018. He tried oral B12 x 2-3 days but discontinued as it made him jittery. Anti-parietal antibodies and intrinsic factor antibodies were negative on 07/28/2018.  He has iron deficiency. Diet is poor. He denies any bleeding.He has not had a colonoscopy in years. Stool was guaiac - x 3 in 03/2018. Urinalysis revealed trace hemoglobin on 04/15/2018. He has ice pica.  He received Venofer weekly x 3 (06/24/2018 - 07/08/2018) and weekly x 3 (12/16/2018 - 12/30/2018).   Ferritin has been followed: 13 on 04/01/2018, 12 on 06/23/2018, 91 on 07/28/2018, 72 on 08/09/2018, 57 on 10/04/2018, 28 on 12/15/2018, 73 on 06/15/2019, 67 on 09/15/2019, and 34 on 12/14/2019.   He has chronic renal insufficiency. Creatinine was 1.5 on 04/15/2018 (CrCl 43 ml/min).  Symptomatically, he states that he sleeps all the time. Whenever he sits down, he falls asleep. He is eating well and takes Vitamin B12.  He denies blood in the stool, black stools, and hematuria.  Chest pain has resolved.  Exam is stable.   Plan: 1.   Review labs from 12/14/2019. 2.Iron deficiency anemia  Hematocrit 33.4.  Hemoglobin 11.7.  MCV 90.5 on 09/15/2019.   Ferritin 67.  Hematocrit 32.0.  Hemoglobin 11.0.  MCV 94.1 on 12/14/2019.   Ferritin 34.  He received Venofer (last 12/30/2018).  Diet is modest; he eats fast food. He denies any melena or hematochezia.             Guaiac cards x3 were negative in the past. Urinalysis revealed trace hemoglobin.  Discuss Venofer secondary to hemoglobin drifting down as well as ferritin.  Venofer today and weekly x1 (total 2). 3.B12 deficiency B12 was 498 on 08/09/2018.    B12 goal is > 400 Continue oral B12.   Check B12 at next lab draw.  4.  Anemia of chronic renal disease Creatinine was  1.48 on 03/21/2019.             Initiate Retacrit if hemoglobin< 10 and all deficiencies corrected. 5.   Venofer today and weekly x 1 (total 2). 6.   RTC in 3 months for labs (CBC with diff, ferritin, B12, folate, TSH). 7.   RTC in 6 months for MD assessment, labs (CBC with diff, ferritin-day before), and +/- Venofer.  I discussed the assessment and treatment plan with the patient.  The patient was provided an opportunity to ask questions and all were answered.  The patient agreed with the plan and demonstrated an understanding of the instructions.  The patient was advised to call back if the symptoms worsen or if the condition fails to improve as anticipated.   Lequita Asal, MD, PhD    12/15/2019, 11:08 AM  I, Mirian Mo Tufford, am acting as Education administrator for [employer]. Mike Gip, MD, PhD.  I, Tameika Heckmann C. Mike Gip, MD, have reviewed the above documentation for accuracy and completeness, and I agree with the above.

## 2019-12-15 ENCOUNTER — Encounter: Payer: Self-pay | Admitting: Hematology and Oncology

## 2019-12-15 ENCOUNTER — Inpatient Hospital Stay (HOSPITAL_BASED_OUTPATIENT_CLINIC_OR_DEPARTMENT_OTHER): Payer: Medicare Other | Admitting: Hematology and Oncology

## 2019-12-15 ENCOUNTER — Inpatient Hospital Stay: Payer: Medicare Other

## 2019-12-15 VITALS — BP 143/59 | HR 57 | Temp 95.8°F | Wt 186.2 lb

## 2019-12-15 VITALS — BP 150/69 | HR 66

## 2019-12-15 DIAGNOSIS — E538 Deficiency of other specified B group vitamins: Secondary | ICD-10-CM | POA: Diagnosis not present

## 2019-12-15 DIAGNOSIS — D509 Iron deficiency anemia, unspecified: Secondary | ICD-10-CM | POA: Diagnosis not present

## 2019-12-15 DIAGNOSIS — R5383 Other fatigue: Secondary | ICD-10-CM | POA: Diagnosis not present

## 2019-12-15 MED ORDER — IRON SUCROSE 20 MG/ML IV SOLN
200.0000 mg | Freq: Once | INTRAVENOUS | Status: AC
  Administered 2019-12-15: 200 mg via INTRAVENOUS
  Filled 2019-12-15: qty 10

## 2019-12-15 MED ORDER — SODIUM CHLORIDE 0.9 % IV SOLN: 200.0000 mg | Freq: Once | INTRAVENOUS | Status: DC

## 2019-12-15 MED ORDER — SODIUM CHLORIDE 0.9 % IV SOLN
Freq: Once | INTRAVENOUS | Status: AC
  Filled 2019-12-15: qty 250

## 2019-12-15 NOTE — Progress Notes (Signed)
No new changes noted today 

## 2019-12-15 NOTE — Progress Notes (Signed)
Patient received prescribed treatment in clinic. Patient stable at discharge. 

## 2019-12-22 ENCOUNTER — Inpatient Hospital Stay: Payer: Medicare Other | Attending: Hematology and Oncology

## 2020-01-02 ENCOUNTER — Inpatient Hospital Stay: Payer: Medicare Other

## 2020-01-03 ENCOUNTER — Inpatient Hospital Stay: Payer: Medicare Other

## 2020-03-14 ENCOUNTER — Inpatient Hospital Stay: Payer: Medicare Other | Attending: Hematology and Oncology

## 2020-03-14 ENCOUNTER — Telehealth: Payer: Self-pay | Admitting: Hematology and Oncology

## 2020-03-14 NOTE — Telephone Encounter (Signed)
Spoke to patient about missing lab appointment today. He stated he had death in the family which he lost his brother. He stated to please call him next week to schedule appointment.

## 2020-06-13 ENCOUNTER — Other Ambulatory Visit: Payer: Medicare Other

## 2020-06-14 ENCOUNTER — Ambulatory Visit: Payer: Medicare Other

## 2020-06-14 ENCOUNTER — Ambulatory Visit: Payer: Medicare Other | Admitting: Oncology

## 2020-06-19 ENCOUNTER — Other Ambulatory Visit: Payer: Self-pay

## 2020-06-19 ENCOUNTER — Inpatient Hospital Stay: Payer: Medicare Other | Attending: Nurse Practitioner

## 2020-06-19 DIAGNOSIS — R413 Other amnesia: Secondary | ICD-10-CM | POA: Insufficient documentation

## 2020-06-19 DIAGNOSIS — Z87891 Personal history of nicotine dependence: Secondary | ICD-10-CM | POA: Diagnosis not present

## 2020-06-19 DIAGNOSIS — D509 Iron deficiency anemia, unspecified: Secondary | ICD-10-CM | POA: Insufficient documentation

## 2020-06-19 DIAGNOSIS — Z8041 Family history of malignant neoplasm of ovary: Secondary | ICD-10-CM | POA: Insufficient documentation

## 2020-06-19 DIAGNOSIS — F32A Depression, unspecified: Secondary | ICD-10-CM | POA: Insufficient documentation

## 2020-06-19 DIAGNOSIS — R5383 Other fatigue: Secondary | ICD-10-CM | POA: Insufficient documentation

## 2020-06-19 DIAGNOSIS — E538 Deficiency of other specified B group vitamins: Secondary | ICD-10-CM | POA: Diagnosis not present

## 2020-06-19 DIAGNOSIS — R42 Dizziness and giddiness: Secondary | ICD-10-CM | POA: Diagnosis not present

## 2020-06-19 DIAGNOSIS — R55 Syncope and collapse: Secondary | ICD-10-CM | POA: Diagnosis not present

## 2020-06-19 DIAGNOSIS — E669 Obesity, unspecified: Secondary | ICD-10-CM | POA: Insufficient documentation

## 2020-06-19 DIAGNOSIS — Z841 Family history of disorders of kidney and ureter: Secondary | ICD-10-CM | POA: Insufficient documentation

## 2020-06-19 DIAGNOSIS — Z9079 Acquired absence of other genital organ(s): Secondary | ICD-10-CM | POA: Insufficient documentation

## 2020-06-19 DIAGNOSIS — Z79899 Other long term (current) drug therapy: Secondary | ICD-10-CM | POA: Diagnosis not present

## 2020-06-19 DIAGNOSIS — N189 Chronic kidney disease, unspecified: Secondary | ICD-10-CM | POA: Diagnosis not present

## 2020-06-19 LAB — CBC WITH DIFFERENTIAL/PLATELET
Abs Immature Granulocytes: 0.04 10*3/uL (ref 0.00–0.07)
Basophils Absolute: 0 10*3/uL (ref 0.0–0.1)
Basophils Relative: 1 %
Eosinophils Absolute: 0.4 10*3/uL (ref 0.0–0.5)
Eosinophils Relative: 6 %
HCT: 32.8 % — ABNORMAL LOW (ref 39.0–52.0)
Hemoglobin: 11.7 g/dL — ABNORMAL LOW (ref 13.0–17.0)
Immature Granulocytes: 1 %
Lymphocytes Relative: 22 %
Lymphs Abs: 1.4 10*3/uL (ref 0.7–4.0)
MCH: 32.4 pg (ref 26.0–34.0)
MCHC: 35.7 g/dL (ref 30.0–36.0)
MCV: 90.9 fL (ref 80.0–100.0)
Monocytes Absolute: 0.5 10*3/uL (ref 0.1–1.0)
Monocytes Relative: 8 %
Neutro Abs: 3.9 10*3/uL (ref 1.7–7.7)
Neutrophils Relative %: 62 %
Platelets: 289 10*3/uL (ref 150–400)
RBC: 3.61 MIL/uL — ABNORMAL LOW (ref 4.22–5.81)
RDW: 13.7 % (ref 11.5–15.5)
WBC: 6.2 10*3/uL (ref 4.0–10.5)
nRBC: 0.3 % — ABNORMAL HIGH (ref 0.0–0.2)

## 2020-06-19 LAB — FERRITIN: Ferritin: 22 ng/mL — ABNORMAL LOW (ref 24–336)

## 2020-06-19 LAB — VITAMIN B12: Vitamin B-12: 901 pg/mL (ref 180–914)

## 2020-06-26 ENCOUNTER — Other Ambulatory Visit: Payer: Medicare Other

## 2020-06-27 ENCOUNTER — Encounter: Payer: Self-pay | Admitting: Oncology

## 2020-06-27 ENCOUNTER — Inpatient Hospital Stay: Payer: Medicare Other

## 2020-06-27 ENCOUNTER — Other Ambulatory Visit: Payer: Self-pay

## 2020-06-27 ENCOUNTER — Inpatient Hospital Stay (HOSPITAL_BASED_OUTPATIENT_CLINIC_OR_DEPARTMENT_OTHER): Payer: Medicare Other | Admitting: Oncology

## 2020-06-27 VITALS — BP 135/53 | HR 56 | Temp 98.0°F | Resp 16 | Wt 187.3 lb

## 2020-06-27 VITALS — BP 152/68 | HR 62 | Resp 18

## 2020-06-27 DIAGNOSIS — D509 Iron deficiency anemia, unspecified: Secondary | ICD-10-CM

## 2020-06-27 DIAGNOSIS — E538 Deficiency of other specified B group vitamins: Secondary | ICD-10-CM

## 2020-06-27 MED ORDER — SODIUM CHLORIDE 0.9 % IV SOLN: 200.0000 mg | Freq: Once | INTRAVENOUS | Status: DC

## 2020-06-27 MED ORDER — SODIUM CHLORIDE 0.9 % IV SOLN
Freq: Once | INTRAVENOUS | Status: AC
Start: 2020-06-27 — End: 2020-06-27
  Filled 2020-06-27: qty 250

## 2020-06-27 MED ORDER — IRON SUCROSE 20 MG/ML IV SOLN
200.0000 mg | Freq: Once | INTRAVENOUS | Status: AC
Start: 2020-06-27 — End: 2020-06-27
  Administered 2020-06-27: 200 mg via INTRAVENOUS
  Filled 2020-06-27: qty 10

## 2020-06-27 MED ORDER — IRON SUCROSE 20 MG/ML IV SOLN: 200.0000 mg | Freq: Once | INTRAVENOUS | Status: AC

## 2020-06-27 NOTE — Patient Instructions (Signed)

## 2020-06-27 NOTE — Progress Notes (Signed)
[facility]  [address], [address] [place], [place] [address] Phone: [phone]  Fax: [phone]   Clinic Day:  06/27/2020  Referring physician: Sallee Lange, *  Chief Complaint: Gregory Tran is a 85 y.o. male with iron deficiency anemia, B12 deficiency, andanemia of chronic kidney disease who is seen for 6 month assessment.  HPI: The patient was last seen in the hematology clinic on 12/15/2019.    The patient reports occasional dizziness. A few months ago, he was drinking water while standing and fainted. He has not had any similar episodes since then.  He reports short term memory loss. He is still able to drive without any problems.  The patient has not seen Dr. Holley Raring recently.    During the interim, he has lost his wife (February 15th, 2022), and his brother (December 2021). He reports "I have been lazy" & he cares for his cats. He reports he has niece that lives in town, and his 2 siblings that live locally. He is now living alone.   He reports eating only fast food. He does not cook at home.  He reports drinking "a little"  water, diet soda (16 ounces), 1 coffee per day.   He feels fatigued.   Past Medical History:  Diagnosis Date  . Arthritis   . BPH (benign prostatic hyperplasia)   . Cancer (HCC)    Basal Cell  . Chronic kidney disease   . Coronary artery disease   . Diabetes mellitus ([place])   . Ejaculatory disorder   . Erectile dysfunction   . Frequency   . GERD (gastroesophageal reflux disease)   . Gross hematuria   . Heart disease   . Hematuria   . HTN (hypertension)   . Hyperlipidemia   . Incomplete bladder emptying   . Microscopic hematuria   . Myocardial infarction ([place])   . Neuropathy   . Nocturia   . Rotator cuff tear left  . TIA (transient ischemic attack)   . Tick bite of multiple sites 5 days ago   chest, and groin area  . Vertigo    1-2x/yr    Past Surgical History:  Procedure Laterality Date   . cardiac stents    . CATARACT EXTRACTION W/PHACO Left 08/02/2018   Procedure: CATARACT EXTRACTION PHACO AND INTRAOCULAR LENS PLACEMENT ([place])  LEFT DIABETIC;  Surgeon: Eulogio Bear, MD;  Location: [facility];  Service: Ophthalmology;  Laterality: Left;  diabetes - insulin and oral meds  . CATARACT EXTRACTION W/PHACO Right 10/11/2018   Procedure: CATARACT EXTRACTION PHACO AND INTRAOCULAR LENS PLACEMENT ([place]) RIGHT DIABETES;  Surgeon: Eulogio Bear, MD;  Location: [facility];  Service: Ophthalmology;  Laterality: Right;  Diabetic - insulin and oral meds  . CORONARY ANGIOPLASTY  2001, 2008   [facility]  . DUPUYTREN CONTRACTURE RELEASE Right 04/30/2016   Procedure: DUPUYTREN CONTRACTURE RELEASE;  Surgeon: Leanor Kail, MD;  Location: [facility];  Service: Orthopedics;  Laterality: Right;  . KYPHOPLASTY N/A 01/26/2018   Procedure: Elwanda [place];  Surgeon: Hessie Knows, MD;  Location: [facility];  Service: Orthopedics;  Laterality: N/A;  . ROTATOR CUFF REPAIR Bilateral   . TRANSURETHRAL RESECTION OF PROSTATE N/A 07/09/2015   Procedure: TRANSURETHRAL RESECTION OF THE PROSTATE (TURP);  Surgeon: Hollice Espy, MD;  Location: [facility];  Service: Urology;  Laterality: N/A;    Family History  Problem Relation Age of Onset  . Ovarian cancer Sister   . Kidney disease Brother  born one kidney  . Bladder Cancer Neg Hx   . Prostate cancer Neg Hx     Social History:  reports that he quit smoking about 3 years ago. His smoking use included cigarettes. He has a 50.00 pack-year smoking history. He has never used smokeless tobacco. He reports that he does not drink alcohol and does not use drugs. His significant otheris [employer] He lives in [place]. The patient is alone today.  Allergies:  Allergies  Allergen Reactions  . Atorvastatin Other (See Comments)  . B Complex-Folic Acid Hives    Current Medications: Current Outpatient Medications  Medication Sig Dispense  Refill  . acetaminophen (TYLENOL) 650 MG CR tablet Take 650 mg by mouth every 8 (eight) hours as needed for pain.    Marland Kitchen amLODipine (NORVASC) 5 MG tablet TAKE 1 TABLET BY MOUTH DAILY  4  . aspirin EC 81 MG tablet Take 81 mg by mouth daily.     . cetirizine (ZYRTEC) 10 MG tablet Take 10 mg by mouth daily as needed for allergies.    . chlorthalidone (HYGROTON) 50 MG tablet Take 1 tablet by mouth daily.    . Cyanocobalamin (VITAMIN B-12 PO) Take by mouth daily.    Marland Kitchen docusate sodium (COLACE) 250 MG capsule Take 1 tablet by mouth 2 (two) times daily.    Marland Kitchen glucose blood (ONE TOUCH ULTRA TEST) test strip U 1 STRIP VIA METER 2 TO 3 XD UTD IN A ROTATING PATTERN    . hydrOXYzine (ATARAX/VISTARIL) 50 MG tablet Take 1 tablet by mouth 2 (two) times daily.    . Insulin Pen Needle (FIFTY50 PEN NEEDLES) 32G X 4 MM MISC by Does not apply route.    . Iron-Vitamin C 65-125 MG TABS Take 1 tablet by mouth 2 (two) times daily. (Patient not taking: Reported on 10/04/2018) 60 tablet 1  . LEVEMIR FLEXTOUCH 100 UNIT/ML Pen 55 Units.   3  . metFORMIN (GLUCOPHAGE) 1000 MG tablet Take 1,000 mg by mouth 2 (two) times daily with a meal.     . metoprolol succinate (TOPROL-XL) 50 MG 24 hr tablet Take 25 mg by mouth daily.     Marland Kitchen NOVOLOG FLEXPEN 100 UNIT/ML FlexPen Inject 22 Units into the skin 3 (three) times daily with meals.   3  . pantoprazole (PROTONIX) 40 MG tablet Take 40 mg by mouth daily.     . rosuvastatin (CRESTOR) 5 MG tablet Take 5 mg by mouth every other day.   1  . Selenium 100 MCG TABS Take 1 tablet by mouth daily.     Marland Kitchen senna-docusate (SENOKOT-S) 8.6-50 MG tablet Take by mouth.    . tamsulosin (FLOMAX) 0.4 MG CAPS capsule TAKE 1 CAPSULE(0.4 MG) BY MOUTH DAILY 30 capsule 3  . triamcinolone ointment (KENALOG) 0.5 % Apply 1 application topically 2 (two) times daily as needed.     No current facility-administered medications for this visit.    Review of Systems  Constitutional: Negative for fever, malaise/fatigue  and weight loss.  HENT: Negative for congestion and hearing loss.   Eyes: Negative for blurred vision and double vision.  Respiratory: Negative for cough.   Cardiovascular: Negative for chest pain and palpitations.  Gastrointestinal: Negative for abdominal pain, constipation, diarrhea, nausea and vomiting.  Genitourinary: Negative for frequency and urgency.  Skin: Negative for rash.  Neurological: Negative for dizziness, tingling and headaches.  Endo/Heme/Allergies: Does not bruise/bleed easily.  Psychiatric/Behavioral: Positive for depression and memory loss. The patient is not nervous/anxious and  does not have insomnia.        "Ive been sad since my wife died"    Performance status (ECOG): 1  Vitals Blood pressure (!) 135/53, pulse (!) 56, temperature 98 F (36.7 C), resp. rate 16, weight 84.9 kg, SpO2 98 %.  Physical Exam Vitals and nursing note reviewed.  Constitutional:      Appearance: Normal appearance. He is obese.  HENT:     Head: Normocephalic and atraumatic.     Nose: No congestion.     Mouth/Throat:     Mouth: Mucous membranes are moist.     Pharynx: Oropharynx is clear.  Eyes:     General:        Right eye: No discharge.        Left eye: No discharge.     Extraocular Movements: Extraocular movements intact.     Pupils: Pupils are equal, round, and reactive to light.  Cardiovascular:     Rate and Rhythm: Normal rate and regular rhythm.     Pulses: Normal pulses.     Heart sounds: No murmur heard.   Pulmonary:     Effort: Pulmonary effort is normal.  Abdominal:     General: Bowel sounds are normal. There is no distension.     Tenderness: There is no abdominal tenderness. There is no guarding.  Musculoskeletal:        General: No swelling or deformity.     Right lower leg: No edema.     Left lower leg: No edema.     Comments: Cane at side.  Skin:    General: Skin is warm and dry.     Capillary Refill: Capillary refill takes 2 to 3 seconds.   Neurological:     Mental Status: He is alert and oriented to person, place, and time. Mental status is at baseline.     Comments: "My brain just stops sometimes"  Psychiatric:        Mood and Affect: Mood normal.        Thought Content: Thought content normal.     No visits with results within 3 Day(s) from this visit.  Latest known visit with results is:  Appointment on 06/19/2020  Component Date Value Ref Range Status  . Ferritin 06/19/2020 22* 24 - 336 ng/mL Final   Performed at [facility], [address] [place], [place] [address]  . WBC 06/19/2020 6.2  4.0 - 10.5 K/uL Final  . RBC 06/19/2020 3.61* 4.22 - 5.81 MIL/uL Final  . Hemoglobin 06/19/2020 11.7* 13.0 - 17.0 g/dL Final  . HCT 06/19/2020 32.8* 39.0 - 52.0 % Final  . MCV 06/19/2020 90.9  80.0 - 100.0 fL Final  . MCH 06/19/2020 32.4  26.0 - 34.0 pg Final  . MCHC 06/19/2020 35.7  30.0 - 36.0 g/dL Final  . RDW 06/19/2020 13.7  11.5 - 15.5 % Final  . Platelets 06/19/2020 289  150 - 400 K/uL Final  . nRBC 06/19/2020 0.3* 0.0 - 0.2 % Final  . Neutrophils Relative % 06/19/2020 62  % Final  . Neutro Abs 06/19/2020 3.9  1.7 - 7.7 K/uL Final  . Lymphocytes Relative 06/19/2020 22  % Final  . Lymphs Abs 06/19/2020 1.4  0.7 - 4.0 K/uL Final  . Monocytes Relative 06/19/2020 8  % Final  . Monocytes Absolute 06/19/2020 0.5  0.1 - 1.0 K/uL Final  . Eosinophils Relative 06/19/2020 6  % Final  . Eosinophils Absolute 06/19/2020 0.4  0.0 - 0.5 K/uL  Final  . Basophils Relative 06/19/2020 1  % Final  . Basophils Absolute 06/19/2020 0.0  0.0 - 0.1 K/uL Final  . Immature Granulocytes 06/19/2020 1  % Final  . Abs Immature Granulocytes 06/19/2020 0.04  0.00 - 0.07 K/uL Final   Performed at [facility], [address] [place], [place] [address]  . Vitamin B-12 06/19/2020 901  180 - 914 pg/mL Final   Comment: (NOTE) This assay is not validated for testing neonatal or myeloproliferative syndrome specimens for Vitamin  B12 levels. Performed at [facility], [place] [address], [place], [place] [address]     Assessment:  RODD HEFT is a 85 y.o. male with a normocytic anemiasecondarytoiron deficiency, B12 deficiency, andanemia of chronic renal disease.   Work-up on 02/13/2020revealed a hematocrit of 29.2, hemoglobin 9.3, MCV 85.1, platelets 287,000, WBC 6600 with an ANC of 3500. Normal studiesincluded: SPEP, folate, and TSH. Free light chain ratio was 2.10 (0.26-1.65). Epo level was 52.5. B12was 231. Ferritinwas 13. Iron saturation was 10% with a TIBC of 442. Retic was 2.1%.  He hasB12 deficiency. B12 was 231 on 02/13/2020and 498 on 08/09/2018. He tried oral B12 x 2-3 days but discontinued as it made him jittery. Anti-parietal antibodies and intrinsic factor antibodies were negative on 07/28/2018.  He has iron deficiency. Diet is poor. He denies any bleeding.He has not had a colonoscopy in years. Stool was guaiac - x 3 in 03/2018. Urinalysis revealed trace hemoglobin on 04/15/2018. He has ice pica.  He received Venofer weekly x 3 (06/24/2018 - 07/08/2018) and weekly x 3 (12/16/2018 - 12/30/2018).   Ferritin has been followed: 13 on 04/01/2018, 12 on 06/23/2018, 91 on 07/28/2018, 72 on 08/09/2018, 57 on 10/04/2018, 28 on 12/15/2018, 73 on 06/15/2019, 67 on 09/15/2019, and 34 on 12/14/2019.   He has chronic renal insufficiency. Creatinine was 1.5 on 04/15/2018 (CrCl 43 ml/min).  Symptomatically, he states that he has been lazy. He feels fatigued but this is stable. He lost his wife and his brother recently. He eats out for every meal.  Exam is stable.   Plan: 1.   Review labs from 06/27/2020.  2.Iron deficiency anemia  Labs from 06/27/2020 show a HGB 11.7, MCV 32.8 and Ferritin 22.   Goal for Ferritin is close to 100.   He received Venofer last on 12/15/2019.  Diet is modest; he eats fast food. He denies any melena or hematochezia.   Guaiac cards x3 were negative in the past. Urinalysis revealed trace hemoglobin.  proceed with IV Venofer x4 doses given weekly per patient request. 3.B12 deficiency  B12 is 901 on 06/19/20    B12 goal is > 400 Continue oral B12.  4. Anemia of chronic renal disease  Hemoglobin from 06/27/2020 was 11.7.  Creatinine was 1.41 on 2.21.             Initiate Retacrit if hemoglobin< 10 and all deficiencies corrected. Disposition Proceed with Venofer today and weekly (total 4 doses) RTC in 3 months for MD assessment, labs (CBC with diff, ferritin-day before), and +/- Venofer.  The patient's diagnosis, an outline of the further diagnostic and laboratory studies which will be required, the recommendation for surgery, and alternatives were discussed with her and her accompanying family members.  All questions were answered to their satisfaction.  I personally had a face to face interaction and evaluated the patient jointly with the NP Student, Mrs. Benedetto Goad.  I have reviewed her history and available records and have  performed the key portions of the physical exam including general, HEENT, abdominal exam, pelvic exam with my findings confirming those documented above by the APP student.  I have discussed the case with the APP student and the patient.  I agree with the above documentation, assessment and plan which was fully formulated by me.  Counseling was completed by me.    Benedetto Goad, Student FNP  Greater than 50% was spent in counseling and coordination of care with this patient including but not limited to discussion of the relevant topics above (See A&P) including, but not limited to diagnosis and management of acute and chronic medical conditions.   Faythe Casa, NP 06/28/2020 1:31 PM

## 2020-07-04 ENCOUNTER — Inpatient Hospital Stay: Payer: Medicare Other | Attending: Nurse Practitioner

## 2020-07-11 ENCOUNTER — Inpatient Hospital Stay: Payer: Medicare Other

## 2020-07-18 ENCOUNTER — Inpatient Hospital Stay: Payer: Medicare Other | Attending: Nurse Practitioner

## 2020-07-18 DIAGNOSIS — D509 Iron deficiency anemia, unspecified: Secondary | ICD-10-CM | POA: Insufficient documentation

## 2020-08-01 ENCOUNTER — Other Ambulatory Visit: Payer: Self-pay

## 2020-08-01 ENCOUNTER — Inpatient Hospital Stay: Payer: Medicare Other

## 2020-08-01 VITALS — BP 165/65 | HR 58 | Temp 96.7°F | Resp 18

## 2020-08-01 DIAGNOSIS — D509 Iron deficiency anemia, unspecified: Secondary | ICD-10-CM | POA: Diagnosis present

## 2020-08-01 MED ORDER — IRON SUCROSE 20 MG/ML IV SOLN
200.0000 mg | Freq: Once | INTRAVENOUS | Status: AC
  Administered 2020-08-01: 200 mg via INTRAVENOUS
  Filled 2020-08-01: qty 10

## 2020-08-01 MED ORDER — SODIUM CHLORIDE 0.9 % IV SOLN
Freq: Once | INTRAVENOUS | Status: AC
  Filled 2020-08-01: qty 250

## 2020-08-01 MED ORDER — SODIUM CHLORIDE 0.9 % IV SOLN: 200.0000 mg | Freq: Once | INTRAVENOUS | Status: DC

## 2020-08-01 NOTE — Patient Instructions (Signed)

## 2020-08-01 NOTE — Progress Notes (Signed)
Received IV Venofer today. Tolerated infusion well. VSS. Discharged to home.

## 2020-09-27 ENCOUNTER — Inpatient Hospital Stay: Payer: Medicare Other | Attending: Oncology

## 2020-09-27 ENCOUNTER — Other Ambulatory Visit: Payer: Self-pay

## 2020-09-27 ENCOUNTER — Inpatient Hospital Stay: Payer: Medicare Other

## 2020-09-27 ENCOUNTER — Inpatient Hospital Stay (HOSPITAL_BASED_OUTPATIENT_CLINIC_OR_DEPARTMENT_OTHER): Payer: Medicare Other | Admitting: Oncology

## 2020-09-27 ENCOUNTER — Encounter: Payer: Self-pay | Admitting: Oncology

## 2020-09-27 VITALS — BP 140/57 | HR 57 | Temp 97.8°F | Resp 16 | Wt 180.4 lb

## 2020-09-27 DIAGNOSIS — E1122 Type 2 diabetes mellitus with diabetic chronic kidney disease: Secondary | ICD-10-CM | POA: Insufficient documentation

## 2020-09-27 DIAGNOSIS — Z8041 Family history of malignant neoplasm of ovary: Secondary | ICD-10-CM | POA: Insufficient documentation

## 2020-09-27 DIAGNOSIS — R5383 Other fatigue: Secondary | ICD-10-CM

## 2020-09-27 DIAGNOSIS — Z8673 Personal history of transient ischemic attack (TIA), and cerebral infarction without residual deficits: Secondary | ICD-10-CM | POA: Insufficient documentation

## 2020-09-27 DIAGNOSIS — E538 Deficiency of other specified B group vitamins: Secondary | ICD-10-CM

## 2020-09-27 DIAGNOSIS — Z79899 Other long term (current) drug therapy: Secondary | ICD-10-CM | POA: Diagnosis not present

## 2020-09-27 DIAGNOSIS — K5900 Constipation, unspecified: Secondary | ICD-10-CM | POA: Diagnosis not present

## 2020-09-27 DIAGNOSIS — Z841 Family history of disorders of kidney and ureter: Secondary | ICD-10-CM | POA: Insufficient documentation

## 2020-09-27 DIAGNOSIS — I2510 Atherosclerotic heart disease of native coronary artery without angina pectoris: Secondary | ICD-10-CM | POA: Diagnosis not present

## 2020-09-27 DIAGNOSIS — N1832 Chronic kidney disease, stage 3b: Secondary | ICD-10-CM | POA: Insufficient documentation

## 2020-09-27 DIAGNOSIS — D631 Anemia in chronic kidney disease: Secondary | ICD-10-CM | POA: Insufficient documentation

## 2020-09-27 DIAGNOSIS — D509 Iron deficiency anemia, unspecified: Secondary | ICD-10-CM

## 2020-09-27 DIAGNOSIS — Z9079 Acquired absence of other genital organ(s): Secondary | ICD-10-CM | POA: Diagnosis not present

## 2020-09-27 DIAGNOSIS — Z87891 Personal history of nicotine dependence: Secondary | ICD-10-CM | POA: Insufficient documentation

## 2020-09-27 DIAGNOSIS — R413 Other amnesia: Secondary | ICD-10-CM | POA: Insufficient documentation

## 2020-09-27 DIAGNOSIS — I252 Old myocardial infarction: Secondary | ICD-10-CM | POA: Diagnosis not present

## 2020-09-27 LAB — CBC WITH DIFFERENTIAL/PLATELET
Abs Immature Granulocytes: 0.03 10*3/uL (ref 0.00–0.07)
Basophils Absolute: 0 10*3/uL (ref 0.0–0.1)
Basophils Relative: 1 %
Eosinophils Absolute: 0.4 10*3/uL (ref 0.0–0.5)
Eosinophils Relative: 6 %
HCT: 33.7 % — ABNORMAL LOW (ref 39.0–52.0)
Hemoglobin: 12 g/dL — ABNORMAL LOW (ref 13.0–17.0)
Immature Granulocytes: 1 %
Lymphocytes Relative: 27 %
Lymphs Abs: 1.6 10*3/uL (ref 0.7–4.0)
MCH: 32.8 pg (ref 26.0–34.0)
MCHC: 35.6 g/dL (ref 30.0–36.0)
MCV: 92.1 fL (ref 80.0–100.0)
Monocytes Absolute: 0.4 10*3/uL (ref 0.1–1.0)
Monocytes Relative: 7 %
Neutro Abs: 3.5 10*3/uL (ref 1.7–7.7)
Neutrophils Relative %: 58 %
Platelets: 270 10*3/uL (ref 150–400)
RBC: 3.66 MIL/uL — ABNORMAL LOW (ref 4.22–5.81)
RDW: 13.9 % (ref 11.5–15.5)
WBC: 6 10*3/uL (ref 4.0–10.5)
nRBC: 0 % (ref 0.0–0.2)

## 2020-09-27 LAB — IRON AND TIBC
Iron: 153 ug/dL (ref 45–182)
Saturation Ratios: 39 % (ref 17.9–39.5)
TIBC: 391 ug/dL (ref 250–450)
UIBC: 238 ug/dL

## 2020-09-27 LAB — TSH: TSH: 1.482 u[IU]/mL (ref 0.350–4.500)

## 2020-09-27 LAB — FERRITIN: Ferritin: 61 ng/mL (ref 24–336)

## 2020-09-27 NOTE — Progress Notes (Signed)
[facility]  [address] [place], [place] [address] Phone: [phone]  Fax: [phone]   Clinic Day:  09/27/2020  Referring physician: Sallee Lange, *  Chief Complaint: Gregory Tran is a 85 y.o. male iron deficiency anemia, B12 deficiency, and anemia of chronic kidney disease   PERTINENT HEMATOLOGY HISTORY  #History of iron deficiency patient has received IV Venofer treatments in the past.  Tolerated well.  # He has B12 deficiency.  B12 was 231 on 04/01/2018 and 498 on 08/09/2018.  He tried oral B12 x 2-3 days but discontinued as it made him jittery. Anti-parietal antibodies and intrinsic factor antibodies were negative on 07/28/2018.  #He has chronic renal insufficiency.  Creatinine was 1.5 on 04/15/2018 (CrCl 43 ml/min). 04/01/2018 SPEP negative for M protein, light chain ration 2.1  INTERVAL HISTORY Gregory Tran is a 85 y.o. male who has above history reviewed by me today presents for follow up visit for management of iron deficiency anemia and history of vitamin B12 deficiency. Patient reports feeling well today. No new complaints.  Past Medical History:  Diagnosis Date   Arthritis    BPH (benign prostatic hyperplasia)    Cancer (HCC)    Basal Cell   Chronic kidney disease    Coronary artery disease    Diabetes mellitus (HCC)    Ejaculatory disorder    Erectile dysfunction    Frequency    GERD (gastroesophageal reflux disease)    Gross hematuria    Heart disease    Hematuria    HTN (hypertension)    Hyperlipidemia    Incomplete bladder emptying    Microscopic hematuria    Myocardial infarction (HCC)    Neuropathy    Nocturia    Rotator cuff tear left   TIA (transient ischemic attack)    Tick bite of multiple sites 5 days ago   chest, and groin area   Vertigo    1-2x/yr    Past Surgical History:  Procedure Laterality Date   cardiac stents     CATARACT EXTRACTION W/PHACO Left 08/02/2018    Procedure: CATARACT EXTRACTION PHACO AND INTRAOCULAR LENS PLACEMENT ([place])  LEFT DIABETIC;  Surgeon: Eulogio Bear, MD;  Location: [place];  Service: Ophthalmology;  Laterality: Left;  diabetes - insulin and oral meds   CATARACT EXTRACTION W/PHACO Right 10/11/2018   Procedure: CATARACT EXTRACTION PHACO AND INTRAOCULAR LENS PLACEMENT ([place]) RIGHT DIABETES;  Surgeon: Eulogio Bear, MD;  Location: [place];  Service: Ophthalmology;  Laterality: Right;  Diabetic - insulin and oral meds   CORONARY ANGIOPLASTY  2001, 2008   [facility]   DUPUYTREN CONTRACTURE RELEASE Right 04/30/2016   Procedure: DUPUYTREN CONTRACTURE RELEASE;  Surgeon: Leanor Kail, MD;  Location: [facility];  Service: Orthopedics;  Laterality: Right;   KYPHOPLASTY N/A 01/26/2018   Procedure: [place] [place];  Surgeon: Hessie Knows, MD;  Location: [facility];  Service: Orthopedics;  Laterality: N/A;   ROTATOR CUFF REPAIR Bilateral    TRANSURETHRAL RESECTION OF PROSTATE N/A 07/09/2015   Procedure: TRANSURETHRAL RESECTION OF THE PROSTATE (TURP);  Surgeon: Hollice Espy, MD;  Location: [facility];  Service: Urology;  Laterality: N/A;    Family History  Problem Relation Age of Onset   Ovarian cancer Sister    Kidney disease Brother        born one kidney   Bladder Cancer Neg Hx    Prostate cancer Neg Hx     Social History:  reports that he quit smoking  about 3 years ago. His smoking use included cigarettes. He has a 50.00 pack-year smoking history. He has never used smokeless tobacco. He reports that he does not drink alcohol and does not use drugs. His significant other is Lyndee Leo.  He lives in [place]. The patient is alone today.  Allergies:  Allergies  Allergen Reactions   Atorvastatin Other (See Comments)   B Complex-Folic Acid Hives    Current Medications: Current Outpatient Medications  Medication Sig Dispense Refill   acetaminophen (TYLENOL) 650 MG CR tablet Take 650 mg by mouth every 8  (eight) hours as needed for pain.     amLODipine (NORVASC) 10 MG tablet Take 1 tablet by mouth daily.     aspirin EC 81 MG tablet Take 81 mg by mouth daily.      chlorthalidone (HYGROTON) 50 MG tablet Take 1 tablet by mouth daily.     Cholecalciferol 50 MCG (2000 UT) CAPS Take by mouth.     docusate sodium (COLACE) 250 MG capsule Take 1 tablet by mouth 2 (two) times daily.     gabapentin (NEURONTIN) 100 MG capsule Take 1 capsule by mouth every morning and take 3 capsules by mouth every night at bedtime.     glucose blood test strip U 1 STRIP VIA METER 2 TO 3 XD UTD IN A ROTATING PATTERN     hydrOXYzine (ATARAX/VISTARIL) 50 MG tablet Take 1 tablet by mouth 2 (two) times daily.     Insulin Pen Needle 32G X 4 MM MISC by Does not apply route.     LEVEMIR FLEXTOUCH 100 UNIT/ML Pen 55 Units.   3   metFORMIN (GLUCOPHAGE) 1000 MG tablet Take 1,000 mg by mouth 2 (two) times daily with a meal.      metoprolol succinate (TOPROL-XL) 50 MG 24 hr tablet Take 25 mg by mouth daily.      NOVOLOG FLEXPEN 100 UNIT/ML FlexPen Inject 22 Units into the skin 3 (three) times daily with meals.   3   pantoprazole (PROTONIX) 40 MG tablet Take 40 mg by mouth daily.      rosuvastatin (CRESTOR) 5 MG tablet Take 5 mg by mouth every other day.   1   tamsulosin (FLOMAX) 0.4 MG CAPS capsule TAKE 1 CAPSULE(0.4 MG) BY MOUTH DAILY 30 capsule 3   amLODipine (NORVASC) 5 MG tablet TAKE 1 TABLET BY MOUTH DAILY (Patient not taking: No sig reported)  4   cetirizine (ZYRTEC) 10 MG tablet Take 10 mg by mouth daily as needed for allergies. (Patient not taking: No sig reported)     Cyanocobalamin (VITAMIN B-12 PO) Take by mouth daily. (Patient not taking: No sig reported)     docusate sodium (COLACE) 50 MG capsule Take by mouth. (Patient not taking: Reported on 09/27/2020)     Iron-Vitamin C 65-125 MG TABS Take 1 tablet by mouth 2 (two) times daily. (Patient not taking: No sig reported) 60 tablet 1   Selenium 100 MCG TABS Take 1 tablet by  mouth daily.  (Patient not taking: No sig reported)     senna-docusate (SENOKOT-S) 8.6-50 MG tablet Take by mouth. (Patient not taking: No sig reported)     triamcinolone ointment (KENALOG) 0.5 % Apply 1 application topically 2 (two) times daily as needed. (Patient not taking: No sig reported)     No current facility-administered medications for this visit.    Review of Systems  Constitutional:  Negative for chills, diaphoresis, fever, malaise/fatigue (sleeping all day) and weight loss.  HENT: Negative.  Negative for congestion, ear discharge, ear pain, hearing loss, nosebleeds, sinus pain, sore throat and tinnitus.   Eyes:  Negative for blurred vision and double vision.       S/p cataract surgery (right eye).  Respiratory: Negative.  Negative for cough, hemoptysis, shortness of breath and wheezing.   Cardiovascular: Negative.  Negative for chest pain, palpitations and leg swelling.  Gastrointestinal:  Positive for constipation (ongoing; on stool softeners). Negative for abdominal pain, diarrhea, heartburn, melena, nausea and vomiting.       Eating well. Good appetite.  Genitourinary: Negative.  Negative for dysuria, frequency, hematuria and urgency.  Musculoskeletal:  Negative for back pain, joint pain, myalgias and neck pain.       Spinal stenosis.  Skin: Negative.  Negative for itching and rash.  Neurological:  Negative for dizziness, tingling, sensory change, focal weakness, loss of consciousness, weakness and headaches.  Endo/Heme/Allergies:  Does not bruise/bleed easily.  Psychiatric/Behavioral:  Positive for memory loss ("my brain goes squirrely"). Negative for depression. The patient is not nervous/anxious and does not have insomnia.   Performance status (ECOG): 1  Vitals Blood pressure (!) 140/57, pulse (!) 57, temperature 97.8 F (36.6 C), resp. rate 16, weight 180 lb 7.1 oz (81.8 kg), SpO2 97 %.  Physical Exam Vitals and nursing note reviewed.  Constitutional:      General:  He is not in acute distress.    Appearance: He is well-developed. He is not diaphoretic.     Comments: Patient ambulates independently  HENT:     Head: Normocephalic and atraumatic.     Mouth/Throat:     Mouth: Mucous membranes are moist.     Pharynx: Oropharynx is clear. No oropharyngeal exudate.  Eyes:     General: No scleral icterus.    Pupils: Pupils are equal, round, and reactive to light.  Neck:     Vascular: No JVD.  Cardiovascular:     Rate and Rhythm: Normal rate and regular rhythm.     Heart sounds: Normal heart sounds. No murmur heard.   No friction rub. No gallop.  Pulmonary:     Effort: Pulmonary effort is normal. No respiratory distress.     Breath sounds: Normal breath sounds. No wheezing or rales.  Abdominal:     General: Bowel sounds are normal. There is no distension.     Palpations: Abdomen is soft. There is no mass.     Tenderness: There is no abdominal tenderness. There is no guarding or rebound.  Musculoskeletal:        General: No swelling or tenderness. Normal range of motion.     Cervical back: Normal range of motion and neck supple.     Right lower leg: No edema.     Left lower leg: No edema.  Lymphadenopathy:     Head:     Right side of head: No preauricular, posterior auricular or occipital adenopathy.     Left side of head: No preauricular, posterior auricular or occipital adenopathy.     Cervical: No cervical adenopathy.     Upper Body:     Right upper body: No supraclavicular or axillary adenopathy.     Left upper body: No supraclavicular or axillary adenopathy.     Lower Body: No right inguinal adenopathy. No left inguinal adenopathy.  Skin:    General: Skin is warm and dry.     Coloration: Skin is not pale.     Findings: No erythema or rash.  Neurological:  Mental Status: He is alert and oriented to person, place, and time.  Psychiatric:        Mood and Affect: Mood normal.   Laboratory studies, CBC    Component Value Date/Time    WBC 6.0 09/27/2020 1350   RBC 3.66 (L) 09/27/2020 1350   HGB 12.0 (L) 09/27/2020 1350   HGB 13.5 06/20/2011 1133   HCT 33.7 (L) 09/27/2020 1350   HCT 38.6 (L) 06/20/2011 1133   PLT 270 09/27/2020 1350   PLT 192 06/20/2011 1133   MCV 92.1 09/27/2020 1350   MCV 96 06/20/2011 1133   MCH 32.8 09/27/2020 1350   MCHC 35.6 09/27/2020 1350   RDW 13.9 09/27/2020 1350   RDW 13.0 06/20/2011 1133   LYMPHSABS 1.6 09/27/2020 1350   MONOABS 0.4 09/27/2020 1350   EOSABS 0.4 09/27/2020 1350   BASOSABS 0.0 09/27/2020 1350   CMP Latest Ref Rng & Units 07/28/2018 04/15/2018 01/19/2017  Glucose 70 - 99 mg/dL 155(H) 208(H) 143(H)  BUN 8 - 23 mg/dL 23 29(H) 20  Creatinine 0.61 - 1.24 mg/dL 1.41(H) 1.50(H) 1.03  Sodium 135 - 145 mmol/L 139 136 139  Potassium 3.5 - 5.1 mmol/L 3.7 3.7 4.1  Chloride 98 - 111 mmol/L 104 101 105  CO2 22 - 32 mmol/L '24 24 22  '$ Calcium 8.9 - 10.3 mg/dL 9.2 9.2 9.8  Total Protein 6.5 - 8.1 g/dL 8.5(H) 8.7(H) 7.8  Total Bilirubin 0.3 - 1.2 mg/dL 0.6 0.2(L) 0.7  Alkaline Phos 38 - 126 U/L 69 67 60  AST 15 - 41 U/L '18 18 31  '$ ALT 0 - 44 U/L '14 12 21      '$ Assessment and Plan.  1. Iron deficiency anemia, unspecified iron deficiency anemia type   2. Other fatigue   3. B12 deficiency     # Iron deficiency anemia Patient's iron labs were available after patient's clinic encounter. Hemoglobin is stable at 12. Iron panel showed ferritin 61, iron saturation 39, TIBC 391. I will hold off IV Venofer treatment at this point.  #History of vitamin B12 deficiency 06/19/2020, vitamin B12 901. Continue monitor.  I will check B12 level at the next visit.  #Anemia of chronic kidney disease. Recommend patient to avoid nephrotoxins Hemoglobin is above 10. Will monitor iron level No need for erythropoietin replacement therapy at this point.  #Fatigue, check TSH-normal today.    RTC in 3 months for MD assessment, labs prior (CBC with diff, ferritin B12, Folate), and +/- Venofer.  I  discussed the assessment and treatment plan with the patient.  The patient was provided an opportunity to ask questions and all were answered.  The patient agreed with the plan and demonstrated an understanding of the instructions.  The patient was advised to call back if the symptoms worsen or if the condition fails to improve as anticipated.  Earlie Server, MD, PhD Hematology Oncology [place] [facility] 09/27/2020 :

## 2020-09-28 ENCOUNTER — Telehealth: Payer: Self-pay

## 2020-09-28 NOTE — Telephone Encounter (Signed)
Pt.notified

## 2020-09-28 NOTE — Telephone Encounter (Signed)
-----   Message from Earlie Server, MD sent at 09/27/2020  9:23 PM EDT ----- Let him know that iron level came back improved. No need to do IV venofer. Keep same plan

## 2021-01-02 ENCOUNTER — Telehealth: Payer: Self-pay | Admitting: Oncology

## 2021-01-02 ENCOUNTER — Other Ambulatory Visit: Payer: Medicare Other

## 2021-01-02 NOTE — Telephone Encounter (Signed)
Pt called to cancel his appt for 11-17. Please call to reschedule at [phone]

## 2021-01-03 ENCOUNTER — Telehealth: Payer: Self-pay | Admitting: Oncology

## 2021-01-03 ENCOUNTER — Ambulatory Visit: Payer: Medicare Other

## 2021-01-03 ENCOUNTER — Ambulatory Visit: Payer: Medicare Other | Admitting: Oncology

## 2021-01-03 ENCOUNTER — Inpatient Hospital Stay: Payer: Medicare Other

## 2021-01-03 ENCOUNTER — Inpatient Hospital Stay: Payer: Medicare Other | Admitting: Oncology

## 2021-01-03 NOTE — Telephone Encounter (Signed)
Will you please contact pt to r/s appts

## 2021-01-03 NOTE — Telephone Encounter (Signed)
Called patient to reschedule his appointment today. He stated that he was too sick to do so right now and would call back when feeling better.

## 2021-01-05 ENCOUNTER — Ambulatory Visit: Admit: 2021-01-05 | Discharge status: Absent without leave | Payer: Self-pay

## 2022-04-03 ENCOUNTER — Other Ambulatory Visit: Payer: Self-pay

## 2022-04-03 ENCOUNTER — Emergency Department: Payer: Medicare Other

## 2022-04-03 ENCOUNTER — Encounter: Payer: Self-pay | Admitting: Internal Medicine

## 2022-04-03 ENCOUNTER — Inpatient Hospital Stay
Admission: EM | Admit: 2022-04-03 | Discharge: 2022-04-05 | DRG: 565 | Disposition: A | Discharge status: Home Health | Payer: Medicare Other | Attending: Family Medicine | Admitting: Family Medicine

## 2022-04-03 DIAGNOSIS — R531 Weakness: Secondary | ICD-10-CM

## 2022-04-03 DIAGNOSIS — Z794 Long term (current) use of insulin: Secondary | ICD-10-CM | POA: Diagnosis not present

## 2022-04-03 DIAGNOSIS — Z888 Allergy status to other drugs, medicaments and biological substances status: Secondary | ICD-10-CM

## 2022-04-03 DIAGNOSIS — Z841 Family history of disorders of kidney and ureter: Secondary | ICD-10-CM | POA: Diagnosis not present

## 2022-04-03 DIAGNOSIS — I2510 Atherosclerotic heart disease of native coronary artery without angina pectoris: Secondary | ICD-10-CM | POA: Diagnosis present

## 2022-04-03 DIAGNOSIS — E785 Hyperlipidemia, unspecified: Secondary | ICD-10-CM | POA: Diagnosis present

## 2022-04-03 DIAGNOSIS — W07XXXA Fall from chair, initial encounter: Secondary | ICD-10-CM | POA: Diagnosis present

## 2022-04-03 DIAGNOSIS — I129 Hypertensive chronic kidney disease with stage 1 through stage 4 chronic kidney disease, or unspecified chronic kidney disease: Secondary | ICD-10-CM | POA: Diagnosis present

## 2022-04-03 DIAGNOSIS — Z602 Problems related to living alone: Secondary | ICD-10-CM | POA: Diagnosis present

## 2022-04-03 DIAGNOSIS — E1140 Type 2 diabetes mellitus with diabetic neuropathy, unspecified: Secondary | ICD-10-CM | POA: Diagnosis present

## 2022-04-03 DIAGNOSIS — N1831 Chronic kidney disease, stage 3a: Secondary | ICD-10-CM | POA: Diagnosis present

## 2022-04-03 DIAGNOSIS — Z8673 Personal history of transient ischemic attack (TIA), and cerebral infarction without residual deficits: Secondary | ICD-10-CM | POA: Diagnosis not present

## 2022-04-03 DIAGNOSIS — N400 Enlarged prostate without lower urinary tract symptoms: Secondary | ICD-10-CM | POA: Diagnosis present

## 2022-04-03 DIAGNOSIS — Z1152 Encounter for screening for COVID-19: Secondary | ICD-10-CM

## 2022-04-03 DIAGNOSIS — T796XXA Traumatic ischemia of muscle, initial encounter: Secondary | ICD-10-CM | POA: Diagnosis present

## 2022-04-03 DIAGNOSIS — Y92009 Unspecified place in unspecified non-institutional (private) residence as the place of occurrence of the external cause: Secondary | ICD-10-CM | POA: Diagnosis not present

## 2022-04-03 DIAGNOSIS — N390 Urinary tract infection, site not specified: Secondary | ICD-10-CM | POA: Diagnosis present

## 2022-04-03 DIAGNOSIS — Z7982 Long term (current) use of aspirin: Secondary | ICD-10-CM | POA: Diagnosis not present

## 2022-04-03 DIAGNOSIS — Z87891 Personal history of nicotine dependence: Secondary | ICD-10-CM

## 2022-04-03 DIAGNOSIS — Z955 Presence of coronary angioplasty implant and graft: Secondary | ICD-10-CM

## 2022-04-03 DIAGNOSIS — Z8041 Family history of malignant neoplasm of ovary: Secondary | ICD-10-CM | POA: Diagnosis not present

## 2022-04-03 DIAGNOSIS — W19XXXA Unspecified fall, initial encounter: Secondary | ICD-10-CM | POA: Diagnosis present

## 2022-04-03 DIAGNOSIS — M6282 Rhabdomyolysis: Secondary | ICD-10-CM | POA: Diagnosis present

## 2022-04-03 DIAGNOSIS — I252 Old myocardial infarction: Secondary | ICD-10-CM

## 2022-04-03 DIAGNOSIS — K219 Gastro-esophageal reflux disease without esophagitis: Secondary | ICD-10-CM | POA: Diagnosis present

## 2022-04-03 DIAGNOSIS — M1990 Unspecified osteoarthritis, unspecified site: Secondary | ICD-10-CM | POA: Diagnosis present

## 2022-04-03 DIAGNOSIS — Z79899 Other long term (current) drug therapy: Secondary | ICD-10-CM

## 2022-04-03 DIAGNOSIS — E1165 Type 2 diabetes mellitus with hyperglycemia: Secondary | ICD-10-CM | POA: Diagnosis present

## 2022-04-03 DIAGNOSIS — Z7984 Long term (current) use of oral hypoglycemic drugs: Secondary | ICD-10-CM | POA: Diagnosis not present

## 2022-04-03 DIAGNOSIS — R296 Repeated falls: Secondary | ICD-10-CM | POA: Diagnosis present

## 2022-04-03 DIAGNOSIS — E1122 Type 2 diabetes mellitus with diabetic chronic kidney disease: Secondary | ICD-10-CM | POA: Diagnosis present

## 2022-04-03 LAB — CBG MONITORING, ED
Glucose-Capillary: 392 mg/dL — ABNORMAL HIGH (ref 70–99)
Glucose-Capillary: 307 mg/dL — ABNORMAL HIGH (ref 70–99)

## 2022-04-03 LAB — CBC WITH DIFFERENTIAL/PLATELET
Abs Immature Granulocytes: 0.12 10*3/uL — ABNORMAL HIGH (ref 0.00–0.07)
Basophils Absolute: 0.1 10*3/uL (ref 0.0–0.1)
Basophils Relative: 1 %
Eosinophils Absolute: 0 10*3/uL (ref 0.0–0.5)
Eosinophils Relative: 0 %
HCT: 36.3 % — ABNORMAL LOW (ref 39.0–52.0)
Hemoglobin: 12.4 g/dL — ABNORMAL LOW (ref 13.0–17.0)
Immature Granulocytes: 1 %
Lymphocytes Relative: 4 %
Lymphs Abs: 0.7 10*3/uL (ref 0.7–4.0)
MCH: 32.5 pg (ref 26.0–34.0)
MCHC: 34.2 g/dL (ref 30.0–36.0)
MCV: 95 fL (ref 80.0–100.0)
Monocytes Absolute: 1 10*3/uL (ref 0.1–1.0)
Monocytes Relative: 6 %
Neutro Abs: 15.1 10*3/uL — ABNORMAL HIGH (ref 1.7–7.7)
Neutrophils Relative %: 88 %
Platelets: 341 10*3/uL (ref 150–400)
RBC: 3.82 MIL/uL — ABNORMAL LOW (ref 4.22–5.81)
RDW: 15.9 % — ABNORMAL HIGH (ref 11.5–15.5)
WBC: 17 10*3/uL — ABNORMAL HIGH (ref 4.0–10.5)
nRBC: 0.3 % — ABNORMAL HIGH (ref 0.0–0.2)

## 2022-04-03 LAB — COMPREHENSIVE METABOLIC PANEL
ALT: 22 U/L (ref 0–44)
AST: 31 U/L (ref 15–41)
Albumin: 3.8 g/dL (ref 3.5–5.0)
Alkaline Phosphatase: 81 U/L (ref 38–126)
Anion gap: 12 (ref 5–15)
BUN: 20 mg/dL (ref 8–23)
CO2: 22 mmol/L (ref 22–32)
Calcium: 8.9 mg/dL (ref 8.9–10.3)
Chloride: 102 mmol/L (ref 98–111)
Creatinine, Ser: 1.32 mg/dL — ABNORMAL HIGH (ref 0.61–1.24)
GFR, Estimated: 53 mL/min — ABNORMAL LOW (ref >60)
Glucose, Bld: 384 mg/dL — ABNORMAL HIGH (ref 70–99)
Potassium: 3.8 mmol/L (ref 3.5–5.1)
Sodium: 136 mmol/L (ref 135–145)
Total Bilirubin: 1.9 mg/dL — ABNORMAL HIGH (ref 0.3–1.2)
Total Protein: 7.6 g/dL (ref 6.5–8.1)

## 2022-04-03 LAB — HEMOGLOBIN A1C
Hgb A1c MFr Bld: 9.4 % — ABNORMAL HIGH (ref 4.8–5.6)
Mean Plasma Glucose: 223.08 mg/dL

## 2022-04-03 LAB — TROPONIN I (HIGH SENSITIVITY)
Troponin I (High Sensitivity): 44 ng/L — ABNORMAL HIGH (ref <18)
Troponin I (High Sensitivity): 20 ng/L — ABNORMAL HIGH (ref <18)

## 2022-04-03 LAB — URINALYSIS, ROUTINE W REFLEX MICROSCOPIC
Bilirubin Urine: NEGATIVE
Glucose, UA: >=500 mg/dL — AB
Ketones, ur: 20 mg/dL — AB
Nitrite: NEGATIVE
Protein, ur: 100 mg/dL — AB
Specific Gravity, Urine: 1.024 (ref 1.005–1.030)
pH: 5 (ref 5.0–8.0)

## 2022-04-03 LAB — RESP PANEL BY RT-PCR (RSV, FLU A&B, COVID)  RVPGX2
Influenza A by PCR: NEGATIVE
Influenza B by PCR: NEGATIVE
Resp Syncytial Virus by PCR: NEGATIVE
SARS Coronavirus 2 by RT PCR: NEGATIVE

## 2022-04-03 LAB — LACTIC ACID, PLASMA
Lactic Acid, Venous: 1.7 mmol/L (ref 0.5–1.9)
Lactic Acid, Venous: 0.7 mmol/L (ref 0.5–1.9)

## 2022-04-03 LAB — GLUCOSE, CAPILLARY
Glucose-Capillary: 196 mg/dL — ABNORMAL HIGH (ref 70–99)
Glucose-Capillary: 365 mg/dL — ABNORMAL HIGH (ref 70–99)

## 2022-04-03 LAB — CK: Total CK: 498 U/L — ABNORMAL HIGH (ref 49–397)

## 2022-04-03 MED ORDER — ROSUVASTATIN CALCIUM 10 MG PO TABS
5.0000 mg | ORAL_TABLET | ORAL | Status: DC
  Administered 2022-04-04: 5 mg via ORAL
  Filled 2022-04-03: qty 1

## 2022-04-03 MED ORDER — INSULIN GLARGINE-YFGN 100 UNIT/ML ~~LOC~~ SOLN
50.0000 [IU] | Freq: Every day | SUBCUTANEOUS | Status: DC
  Administered 2022-04-03 – 2022-04-05 (×3): 50 [IU] via SUBCUTANEOUS
  Filled 2022-04-03 (×3): qty 0.5

## 2022-04-03 MED ORDER — GABAPENTIN 100 MG PO CAPS
100.0000 mg | ORAL_CAPSULE | Freq: Every day | ORAL | Status: DC
  Administered 2022-04-03 – 2022-04-05 (×3): 100 mg via ORAL
  Filled 2022-04-03 (×3): qty 1

## 2022-04-03 MED ORDER — SODIUM CHLORIDE 0.9 % IV SOLN: INTRAVENOUS | Status: AC

## 2022-04-03 MED ORDER — HYDROXYZINE HCL 50 MG PO TABS
50.0000 mg | ORAL_TABLET | Freq: Two times a day (BID) | ORAL | Status: DC | PRN
  Administered 2022-04-05: 50 mg via ORAL
  Filled 2022-04-03: qty 1

## 2022-04-03 MED ORDER — ONDANSETRON HCL 4 MG PO TABS: 4.0000 mg | ORAL_TABLET | Freq: Four times a day (QID) | ORAL | Status: DC | PRN

## 2022-04-03 MED ORDER — SODIUM CHLORIDE 0.9 % IV BOLUS
1000.0000 mL | Freq: Once | INTRAVENOUS | Status: AC
  Administered 2022-04-03: 1000 mL via INTRAVENOUS

## 2022-04-03 MED ORDER — METOPROLOL SUCCINATE ER 25 MG PO TB24
25.0000 mg | ORAL_TABLET | Freq: Every day | ORAL | Status: DC
  Administered 2022-04-03 – 2022-04-05 (×3): 25 mg via ORAL
  Filled 2022-04-03 (×3): qty 1

## 2022-04-03 MED ORDER — SODIUM CHLORIDE 0.9 % IV SOLN
1.0000 g | Freq: Once | INTRAVENOUS | Status: AC
  Administered 2022-04-03: 1 g via INTRAVENOUS
  Filled 2022-04-03: qty 10

## 2022-04-03 MED ORDER — AMLODIPINE BESYLATE 10 MG PO TABS
10.0000 mg | ORAL_TABLET | Freq: Every day | ORAL | Status: DC
  Administered 2022-04-03 – 2022-04-05 (×3): 10 mg via ORAL
  Filled 2022-04-03: qty 2
  Filled 2022-04-03 (×2): qty 1

## 2022-04-03 MED ORDER — ACETAMINOPHEN 325 MG PO TABS: 650.0000 mg | ORAL_TABLET | Freq: Three times a day (TID) | ORAL | Status: DC | PRN

## 2022-04-03 MED ORDER — ONDANSETRON HCL 4 MG/2ML IJ SOLN: 4.0000 mg | Freq: Four times a day (QID) | INTRAMUSCULAR | Status: DC | PRN

## 2022-04-03 MED ORDER — INSULIN ASPART 100 UNIT/ML IJ SOLN
0.0000 [IU] | Freq: Three times a day (TID) | INTRAMUSCULAR | Status: DC
  Administered 2022-04-03: 8 [IU] via SUBCUTANEOUS
  Administered 2022-04-03: 11 [IU] via SUBCUTANEOUS
  Administered 2022-04-04: 5 [IU] via SUBCUTANEOUS
  Administered 2022-04-04: 8 [IU] via SUBCUTANEOUS
  Administered 2022-04-04: 3 [IU] via SUBCUTANEOUS
  Administered 2022-04-05: 11 [IU] via SUBCUTANEOUS
  Administered 2022-04-05: 4 [IU] via SUBCUTANEOUS
  Filled 2022-04-03 (×8): qty 1

## 2022-04-03 MED ORDER — SODIUM CHLORIDE 0.9 % IV SOLN
1.0000 g | INTRAVENOUS | Status: DC
  Administered 2022-04-04 – 2022-04-05 (×2): 1 g via INTRAVENOUS
  Filled 2022-04-03: qty 1
  Filled 2022-04-03: qty 10

## 2022-04-03 MED ORDER — ENOXAPARIN SODIUM 40 MG/0.4ML IJ SOSY
40.0000 mg | PREFILLED_SYRINGE | INTRAMUSCULAR | Status: DC
  Administered 2022-04-03 – 2022-04-04 (×2): 40 mg via SUBCUTANEOUS
  Filled 2022-04-03 (×2): qty 0.4

## 2022-04-03 MED ORDER — ASPIRIN 81 MG PO TBEC
81.0000 mg | DELAYED_RELEASE_TABLET | Freq: Every day | ORAL | Status: DC
  Administered 2022-04-03 – 2022-04-05 (×3): 81 mg via ORAL
  Filled 2022-04-03 (×3): qty 1

## 2022-04-03 NOTE — Assessment & Plan Note (Signed)
Traumatic rhabdomyolysis disease following a fall (mild) Continue IV fluid hydration Repeat CK levels in a.m.

## 2022-04-03 NOTE — Evaluation (Signed)
Physical Therapy Evaluation Patient Details Name: Gregory Tran MRN: [id number] DOB: 12/03/1935 Today's Date: 04/03/2022  History of Present Illness  87 y.o. male with medical history significant for diabetes mellitus with complications of stage IIIa chronic kidney disease, GERD, coronary artery disease, hypertension who was brought into the ER by EMS after a fall.  Clinical Impression  Pt was able and willing to participate with PT exam and subsequent session/gait training but ultimately was far more limited than what would be expected from someone living at home alone.  He could not get to sitting EOB w/o significant assist (apparently sleeps in recliner), could not get close to putting on a sock on his own, struggled with getting to standing from standard height surface and though he was able to ambulate ~60 ft it was choppy, unsafe, highly reliant on the walker and generally did not at all look as though he was someone able to typically ambulate w/o AD, drive to and walk around the grocery store etc.  Pt with general confusion, pleasant and willing to participate but with decreased safety awareness and generally far from his ostensible baseline.  Pt will benefit from continued PT to address functional limitations.       Recommendations for follow up therapy are one component of a multi-disciplinary discharge planning process, led by the attending physician.  Recommendations may be updated based on patient status, additional functional criteria and insurance authorization.  Follow Up Recommendations Skilled nursing-short term rehab (<3 hours/day) Can patient physically be transported by private vehicle: Yes    Assistance Recommended at Discharge Frequent or constant Supervision/Assistance  Patient can return home with the following  A lot of help with walking and/or transfers;A lot of help with bathing/dressing/bathroom;Assist for transportation;Help with stairs or ramp for  entrance;Assistance with cooking/housework    Equipment Recommendations  (TBD at rehab)  Recommendations for Other Services       Functional Status Assessment Patient has had a recent decline in their functional status and demonstrates the ability to make significant improvements in function in a reasonable and predictable amount of time.     Precautions / Restrictions Precautions Precautions: Fall Restrictions Weight Bearing Restrictions: No      Mobility  Bed Mobility Overal bed mobility: Needs Assistance Bed Mobility: Supine to Sit     Supine to sit: Mod assist     General bed mobility comments: Pt very much struggled to get to sitting at EOB, needed assist with LEs, trunk control, scooting to EOB once upright and generally needed consistent cuing to maintain the effort    Transfers Overall transfer level: Needs assistance Equipment used: Rolling walker (2 wheels) Transfers: Sit to/from Stand Sit to Stand: Min assist           General transfer comment: Pt showing poor awareness for self selecting positioning to set up for standing, cuing for U&LE placement, sequencing and AD use.  MinA to rise.    Ambulation/Gait Ambulation/Gait assistance: Min assist Gait Distance (Feet): 60 Feet Assistive device: Rolling walker (2 wheels)         General Gait Details: Pt with very choppy, Parkinsonian-type gait with poor walker awareness/control.  No LOBs but with R LE trailing and inability to makemeaningful adjustments as cued to improve cadence.  Apparently pt uses cane or no AD at baseline but showed inability to even consider this today.  Stairs            Wheelchair Mobility    Modified Rankin (Stroke Patients  Only)       Balance Overall balance assessment: Needs assistance Sitting-balance support: Bilateral upper extremity supported Sitting balance-Leahy Scale: Fair     Standing balance support: Bilateral upper extremity supported, Reliant on  assistive device for balance Standing balance-Leahy Scale: Poor                               Pertinent Vitals/Pain Pain Assessment Pain Assessment: Faces Faces Pain Scale: Hurts little more Pain Location: R shoulder    Home Living Family/patient expects to be discharged to:: Unsure Living Arrangements: Alone     Home Access: Stairs to enter (multiple entry points with steps, 4 steps from garage which is hisprimary entry) Entrance Stairs-Rails:  (yes) Entrance Stairs-Number of Steps: 4     Home Equipment: Rollator (4 wheels);Cane - single point      Prior Function Prior Level of Function : History of Falls (last six months);Independent/Modified Independent             Mobility Comments: Apparently he was driving and running errands, grocery shopping, etc ADLs Comments: per niece seems that he has been less consistent with bathing, managing in the home     Hand Dominance        Extremity/Trunk Assessment   Upper Extremity Assessment Upper Extremity Assessment: Generalized weakness (R UE grossly 4-/5 apart from shoulder 3/5, L UE grossly 4/5)    Lower Extremity Assessment Lower Extremity Assessment: Generalized weakness (R LE grossly 3+/5, L grossly 4/5)       Communication      Cognition Arousal/Alertness: Awake/alert Behavior During Therapy: Restless, Impulsive Overall Cognitive Status: Impaired/Different from baseline Area of Impairment: Orientation, Safety/judgement                               General Comments: Pt alert to self but somewhat confused about his situation, knee the date apart from the year, unable to give a lot of information about PLOF - niece needing to field most of the questions        General Comments General comments (skin integrity, edema, etc.): Unsure of true baseline, however per today's performance it is difficult to see how he was appropriately managing living at home alone, driving, etc     Exercises     Assessment/Plan    PT Assessment Patient needs continued PT services  PT Problem List Decreased strength;Decreased range of motion;Decreased activity tolerance;Decreased balance;Decreased mobility;Decreased coordination;Decreased cognition;Decreased knowledge of use of DME;Decreased safety awareness;Pain       PT Treatment Interventions DME instruction;Gait training;Functional mobility training;Therapeutic activities;Therapeutic exercise;Balance training;Neuromuscular re-education;Cognitive remediation;Patient/family education    PT Goals (Current goals can be found in the Care Plan section)  Acute Rehab PT Goals Patient Stated Goal: go home PT Goal Formulation: With patient/family Time For Goal Achievement: 04/16/22 Potential to Achieve Goals: Fair    Frequency Min 2X/week     Co-evaluation               AM-PAC PT "6 Clicks" Mobility  Outcome Measure Help needed turning from your back to your side while in a flat bed without using bedrails?: A Little Help needed moving from lying on your back to sitting on the side of a flat bed without using bedrails?: A Lot Help needed moving to and from a bed to a chair (including a wheelchair)?: A Little Help needed standing up from a  chair using your arms (e.g., wheelchair or bedside chair)?: A Little Help needed to walk in hospital room?: A Lot Help needed climbing 3-5 steps with a railing? : A Lot 6 Click Score: 15    End of Session Equipment Utilized During Treatment: Gait belt Activity Tolerance: Patient limited by fatigue Patient left: with chair alarm set;with call bell/phone within reach;with family/visitor present Nurse Communication: Mobility status PT Visit Diagnosis: Unsteadiness on feet (R26.81);Muscle weakness (generalized) (M62.81);Difficulty in walking, not elsewhere classified (R26.2);Repeated falls (R29.6)    Time: [id number] PT Time Calculation (min) (ACUTE ONLY): 27 min   Charges:   PT  Evaluation $PT Eval Low Complexity: 1 Low PT Treatments $Gait Training: 8-22 mins        Kreg Shropshire, DPT 04/03/2022, 6:24 PM

## 2022-04-03 NOTE — Assessment & Plan Note (Signed)
Patient has a white count of 17,000 and urine analysis is concerning for possible UTI but not really convincing Continue Rocephin 1 g IV daily initiated in the ER Follow-up results of urine culture

## 2022-04-03 NOTE — ED Notes (Signed)
Informed RN bed assigned 

## 2022-04-03 NOTE — ED Provider Notes (Signed)
[facility] Provider Note    Event Date/Time   First MD Initiated Contact with Patient 04/03/22 [phone]     (approximate)   History   Fall   HPI  Gregory Tran is a 87 y.o. male with history of iron deficiency anemia, CKD, diabetes, hypertension, hyperlipidemia, neuropathy, TIA, and arthritis who presents with a fall and generalized weakness.  The patient normally walks with a walker.  He states that he fell out of his recliner last night around 8 PM and then was unable to get up off the floor although he was able to crawl and try to pull himself up on a chair.  The patient states that he has been feeling weak.  He states he hurt his right shoulder but is able to move it.  He denies any other acute pain.  He did not hit his head or lose consciousness.  Per EMS, blood glucose was over 500.  I reviewed the past medical records.  The patient has no recent ED visits or admissions.  His most recent outpatient encounter was with Dr. Ellison Hughs from family medicine on 02/21/2022 for follow-up of his chronic conditions.  The patient is on metformin and Tresiba for his diabetes.  He is not on any anticoagulation.   Physical Exam   Triage Vital Signs: ED Triage Vitals  Enc Vitals Group     BP 04/03/22 0856 (!) 164/75     Pulse Rate 04/03/22 0856 94     Resp 04/03/22 0856 16     Temp 04/03/22 0856 98.4 F (36.9 C)     Temp src --      SpO2 04/03/22 0855 96 %     Weight 04/03/22 0856 176 lb 14.4 oz (80.2 kg)     Height 04/03/22 0856 5' 8"$  (1.727 m)     Head Circumference --      Peak Flow --      Pain Score --      Pain Loc --      Pain Edu? --      Excl. in [place]? --     Most recent vital signs: Vitals:   04/03/22 0855 04/03/22 0856  BP:  (!) 164/75  Pulse:  94  Resp:  16  Temp:  98.4 F (36.9 C)  SpO2: 96%      General: Alert, oriented x 2, no acute distress. CV:  Good peripheral perfusion.  Normal heart sounds. Resp:  Normal effort.  Lungs  CTAB. Abd:  Soft and nontender.  No distention.  Other:  Full range of motion to bilateral shoulders, hips, knees.  No visible trauma.  No midline cervical spinal tenderness.  EOMI.  PERRLA.  Motor intact in all extremities.  Dry mucous membranes.   ED Results / Procedures / Treatments   Labs (all labs ordered are listed, but only abnormal results are displayed) Labs Reviewed  COMPREHENSIVE METABOLIC PANEL - Abnormal; Notable for the following components:      Result Value   Glucose, Bld 384 (*)    Creatinine, Ser 1.32 (*)    Total Bilirubin 1.9 (*)    GFR, Estimated 53 (*)    All other components within normal limits  CBC WITH DIFFERENTIAL/PLATELET - Abnormal; Notable for the following components:   WBC 17.0 (*)    RBC 3.82 (*)    Hemoglobin 12.4 (*)    HCT 36.3 (*)    RDW 15.9 (*)    nRBC 0.3 (*)  Neutro Abs 15.1 (*)    Abs Immature Granulocytes 0.12 (*)    All other components within normal limits  CK - Abnormal; Notable for the following components:   Total CK 498 (*)    All other components within normal limits  URINALYSIS, ROUTINE W REFLEX MICROSCOPIC - Abnormal; Notable for the following components:   Color, Urine YELLOW (*)    APPearance HAZY (*)    Glucose, UA >=500 (*)    Hgb urine dipstick LARGE (*)    Ketones, ur 20 (*)    Protein, ur 100 (*)    Leukocytes,Ua TRACE (*)    Bacteria, UA FEW (*)    All other components within normal limits  BLOOD GAS, VENOUS - Abnormal; Notable for the following components:   Acid-base deficit 2.5 (*)    All other components within normal limits  CBG MONITORING, ED - Abnormal; Notable for the following components:   Glucose-Capillary 392 (*)    All other components within normal limits  TROPONIN I (HIGH SENSITIVITY) - Abnormal; Notable for the following components:   Troponin I (High Sensitivity) 44 (*)    All other components within normal limits  RESP PANEL BY RT-PCR (RSV, FLU A&B, COVID)  RVPGX2  LACTIC ACID, PLASMA   LACTIC ACID, PLASMA  TROPONIN I (HIGH SENSITIVITY)     EKG  ED ECG REPORT I, Arta Silence, the attending physician, personally viewed and interpreted this ECG.  Date: 04/03/2022 EKG Time: 0854 Rate: 96 Rhythm: normal sinus rhythm QRS Axis: normal Intervals: Nonspecific IVCD ST/T Wave abnormalities: normal Narrative Interpretation: no evidence of acute ischemia    RADIOLOGY  Chest x-ray: I independently viewed and interpreted the images; there is no focal consolidation or edema   PROCEDURES:  Critical Care performed: No  Procedures   MEDICATIONS ORDERED IN ED: Medications  cefTRIAXone (ROCEPHIN) 1 g in sodium chloride 0.9 % 100 mL IVPB (has no administration in time range)  sodium chloride 0.9 % bolus 1,000 mL (1,000 mLs Intravenous New Bag/Given 04/03/22 0919)     IMPRESSION / MDM / ASSESSMENT AND PLAN / ED COURSE  I reviewed the triage vital signs and the nursing notes.  87 year old male with PMH as noted above presents with a fall out of his recliner and generalized weakness although he has no significant trauma.  He was noted to be hyperglycemic.  On exam his vital signs are normal.  He is alert with a nonfocal neurologic exam.  Differential diagnosis includes, but is not limited to, HHS, DKA, other hyperglycemic crisis, dehydration, electrolyte abnormality, other metabolic etiology, rhabdomyolysis, acute renal insufficiency, COVID-19 or other viral infection, UTI or other acute infection/sepsis, less likely primary cardiac cause.  We will obtain lab workup, chest x-ray, give fluids, and reassess.  Patient's presentation is most consistent with acute presentation with potential threat to life or bodily function.  The patient is on the cardiac monitor to evaluate for evidence of arrhythmia and/or significant heart rate changes.   ----------------------------------------- 11:20 AM on 04/03/2022 -----------------------------------------  CK is elevated  consistent with rhabdomyolysis.  WBC count is also elevated.  Chest x-ray and respiratory panel are negative.  Lactate is normal.  Urinalysis shows equivocal findings for UTI.  I have ordered IV ceftriaxone for empiric treatment.  I consulted Dr. Francine Graven from the hospitalist service; based on discussion she agrees to admit the patient.  FINAL CLINICAL IMPRESSION(S) / ED DIAGNOSES   Final diagnoses:  Weakness  Non-traumatic rhabdomyolysis     Rx / DC  Orders   ED Discharge Orders     None        Note:  This document was prepared using Dragon voice recognition software and may include unintentional dictation errors.    Arta Silence, MD 04/03/22 1121

## 2022-04-03 NOTE — H&P (Signed)
History and Physical    Patient: Gregory Tran [id number] DOB: [birth year] DOA: 04/03/2022 DOS: the patient was seen and examined on 04/03/2022 PCP: Sallee Lange, NP  Patient coming from: Home  Chief Complaint:  Chief Complaint  Patient presents with   Fall    Patient is a very poor historian HPI: Gregory Tran is a 87 y.o. male with medical history significant for diabetes mellitus with complications of stage IIIa chronic kidney disease, GERD, coronary artery disease, hypertension who was brought into the ER by EMS after a fall. Per EMR patient fell the night prior to his admission while trying to get out of his recliner and was on the floor all night.  Patient states he fell on the morning of his admission and thinks it happened at about 8 AM.  He does not remember the events surrounding the fall but states that he was tangled up with some chairs.  He lives alone and has had several falls over the last couple of weeks.  At baseline he ambulates with a cane.  He has a life alert and was able to get help after the fall. He complains of pain in his right shoulder and has a cough that is productive of clear phlegm per patient.  He is unable to tell me if he has had fever and chills at home. He denies having any chest pain but has shortness of breath with exertion.  Has no abdominal pain, no nausea, no vomiting or diarrhea.  He denies having any urinary symptoms and denies having any headache, no blurred vision or focal deficit. Upon arrival to the ER, he was afebrile, blood pressure was elevated and the rest of his vital signs were stable. Abnormal labs include glucose 384, serum creatinine 1.32 which is about his baseline, troponin of 44, total CK4 98, white count of 17 Respiratory viral panel was negative Chest x-ray showed clear lungs He received 1 L of IV fluid and a dose of Rocephin 1 g for presumed UTI. He will be admitted to the hospital for further  evaluation     Review of Systems: As mentioned in the history of present illness. All other systems reviewed and are negative. Past Medical History:  Diagnosis Date   Arthritis    BPH (benign prostatic hyperplasia)    Cancer (HCC)    Basal Cell   Chronic kidney disease    Coronary artery disease    Diabetes mellitus (HCC)    Ejaculatory disorder    Erectile dysfunction    Frequency    GERD (gastroesophageal reflux disease)    Gross hematuria    Heart disease    Hematuria    HTN (hypertension)    Hyperlipidemia    Incomplete bladder emptying    Microscopic hematuria    Myocardial infarction (HCC)    Neuropathy    Nocturia    Rotator cuff tear left   TIA (transient ischemic attack)    Tick bite of multiple sites 5 days ago   chest, and groin area   Vertigo    1-2x/yr   Past Surgical History:  Procedure Laterality Date   cardiac stents     CATARACT EXTRACTION W/PHACO Left 08/02/2018   Procedure: CATARACT EXTRACTION PHACO AND INTRAOCULAR LENS PLACEMENT ([place])  LEFT DIABETIC;  Surgeon: Eulogio Bear, MD;  Location: [place];  Service: Ophthalmology;  Laterality: Left;  diabetes - insulin and oral meds   CATARACT EXTRACTION W/PHACO Right 10/11/2018   Procedure: CATARACT  EXTRACTION PHACO AND INTRAOCULAR LENS PLACEMENT ([facility]) RIGHT DIABETES;  Surgeon: Eulogio Bear, MD;  Location: [place];  Service: Ophthalmology;  Laterality: Right;  Diabetic - insulin and oral meds   CORONARY ANGIOPLASTY  2001, 2008   [facility]   DUPUYTREN CONTRACTURE RELEASE Right 04/30/2016   Procedure: DUPUYTREN CONTRACTURE RELEASE;  Surgeon: Leanor Kail, MD;  Location: [facility];  Service: Orthopedics;  Laterality: Right;   KYPHOPLASTY N/A 01/26/2018   Procedure: [place];  Surgeon: Hessie Knows, MD;  Location: [facility];  Service: Orthopedics;  Laterality: N/A;   ROTATOR CUFF REPAIR Bilateral    TRANSURETHRAL RESECTION OF PROSTATE N/A 07/09/2015   Procedure:  TRANSURETHRAL RESECTION OF THE PROSTATE (TURP);  Surgeon: Hollice Espy, MD;  Location: [facility];  Service: Urology;  Laterality: N/A;   Social History:  reports that he quit smoking about 4 years ago. His smoking use included cigarettes. He has a 50.00 pack-year smoking history. He has never used smokeless tobacco. He reports that he does not drink alcohol and does not use drugs.  Allergies  Allergen Reactions   Atorvastatin Other (See Comments)   B Complex-Folic Acid Hives    Family History  Problem Relation Age of Onset   Ovarian cancer Sister    Kidney disease Brother        born one kidney   Bladder Cancer Neg Hx    Prostate cancer Neg Hx     Prior to Admission medications   Medication Sig Start Date End Date Taking? Authorizing Provider  acetaminophen (TYLENOL) 650 MG CR tablet Take 650 mg by mouth every 8 (eight) hours as needed for pain.    [provider]  amLODipine (NORVASC) 10 MG tablet Take 1 tablet by mouth daily. 06/11/20   [provider]  amLODipine (NORVASC) 5 MG tablet TAKE 1 TABLET BY MOUTH DAILY Patient not taking: No sig reported 09/18/14   [provider]  aspirin EC 81 MG tablet Take 81 mg by mouth daily.     [provider]  cetirizine (ZYRTEC) 10 MG tablet Take 10 mg by mouth daily as needed for allergies. Patient not taking: No sig reported    [provider]  chlorthalidone (HYGROTON) 50 MG tablet Take 1 tablet by mouth daily. 02/02/18   [provider]  Cholecalciferol 50 MCG (2000 UT) CAPS Take by mouth.    [provider]  Cyanocobalamin (VITAMIN B-12 PO) Take by mouth daily. Patient not taking: No sig reported    [provider]  docusate sodium (COLACE) 250 MG capsule Take 1 tablet by mouth 2 (two) times daily.    [provider]  docusate sodium (COLACE) 50 MG capsule Take by mouth. Patient not taking: Reported on 09/27/2020    [provider]  gabapentin  (NEURONTIN) 100 MG capsule Take 1 capsule by mouth every morning and take 3 capsules by mouth every night at bedtime. 02/13/20   [provider]  glucose blood test strip U 1 STRIP VIA METER 2 TO 3 XD UTD IN A ROTATING PATTERN 07/22/17   [provider]  hydrOXYzine (ATARAX/VISTARIL) 50 MG tablet Take 1 tablet by mouth 2 (two) times daily. 02/11/18   [provider]  Insulin Pen Needle 32G X 4 MM MISC by Does not apply route. 06/01/17   [provider]  Iron-Vitamin C 65-125 MG TABS Take 1 tablet by mouth 2 (two) times daily. Patient not taking: No sig reported 04/15/18   Karen Kitchens, NP  LEVEMIR FLEXTOUCH 100 UNIT/ML Pen 55 Units.  02/02/15   [provider]  metFORMIN (GLUCOPHAGE) 1000 MG tablet Take 1,000 mg by mouth 2 (two) times daily with a meal.     [provider]  metoprolol succinate (TOPROL-XL) 50 MG 24 hr tablet Take 25 mg by mouth daily.     [provider]  NOVOLOG FLEXPEN 100 UNIT/ML FlexPen Inject 22 Units into the skin 3 (three) times daily with meals.  09/27/14   [provider]  pantoprazole (PROTONIX) 40 MG tablet Take 40 mg by mouth daily.  08/04/13   [provider]  rosuvastatin (CRESTOR) 5 MG tablet Take 5 mg by mouth every other day.  04/20/15   [provider]  Selenium 100 MCG TABS Take 1 tablet by mouth daily.  Patient not taking: No sig reported 03/11/04   [provider]  senna-docusate (SENOKOT-S) 8.6-50 MG tablet Take by mouth. Patient not taking: No sig reported 06/13/19   [provider]  tamsulosin (FLOMAX) 0.4 MG CAPS capsule TAKE 1 CAPSULE(0.4 MG) BY MOUTH DAILY 12/30/16   Hollice Espy, MD  triamcinolone ointment (KENALOG) 0.5 % Apply 1 application topically 2 (two) times daily as needed. Patient not taking: No sig reported    [provider]    Physical Exam: Vitals:   04/03/22 0855 04/03/22 0856  BP:  (!) 164/75  Pulse:  94  Resp:  16  Temp:   98.4 F (36.9 C)  SpO2: 96%   Weight:  80.2 kg  Height:  5' 8"$  (1.727 m)   Physical Exam Vitals and nursing note reviewed.  Constitutional:      Appearance: Normal appearance.     Comments: Oriented to person and place, not to time  HENT:     Head: Normocephalic and atraumatic.     Nose: Nose normal.     Mouth/Throat:     Mouth: Mucous membranes are moist.  Eyes:     Conjunctiva/sclera: Conjunctivae normal.  Cardiovascular:     Rate and Rhythm: Normal rate and regular rhythm.  Pulmonary:     Effort: Pulmonary effort is normal.     Breath sounds: Normal breath sounds.  Abdominal:     General: Bowel sounds are normal.     Palpations: Abdomen is soft.     Comments: Central adiposity  Musculoskeletal:        General: Normal range of motion.     Cervical back: Normal range of motion and neck supple.  Skin:    General: Skin is warm and dry.  Neurological:     Mental Status: He is alert.     Motor: Weakness present.     Comments: Oriented to person and place but not to time  Psychiatric:        Mood and Affect: Mood normal.        Behavior: Behavior normal.     Data Reviewed: Relevant notes from primary care and specialist visits, past discharge summaries as available in EHR, including Care Everywhere. Prior diagnostic testing as pertinent to current admission diagnoses Updated medications and problem lists for reconciliation ED course, including vitals, labs, imaging, treatment and response to treatment Triage notes, nursing and pharmacy notes and ED provider's notes Notable results as noted in HPI Labs reviewed.  Sodium 136, potassium 3.8, chloride 102, bicarb 22, glucose 384, BUN 20, creatinine 1.32, calcium 8.9, total protein 7.6, albumin 3.8, AST 31, ALT 22, alkaline phosphatase 81, total bilirubin 1.9, troponin 44, total CK4 98,  lactic acid 1.7, white count 17.0, hemoglobin 12.4, hematocrit 36.3, platelet 341 VBG 7.32/47 47/24.2 Twelve-lead EKG reviewed by me shows  normal sinus rhythm There are no new results to review at this time.  Assessment and Plan: * Weakness generalized Patient presents to the emergency room for evaluation after a fall at home with inability to get up. He lives alone and at baseline ambulates with a cane and sometimes a rolling walker Will place patient on fall precautions Consult physical therapy  Rhabdomyolysis Traumatic rhabdomyolysis disease following a fall (mild) Continue IV fluid hydration Repeat CK levels in a.m.  UTI (urinary tract infection) Patient has a white count of 17,000 and urine analysis is concerning for possible UTI but not really convincing Continue Rocephin 1 g IV daily initiated in the ER Follow-up results of urine culture  Fall Treatment as outlined in 1  Uncontrolled type 2 diabetes mellitus with hyperglycemia, with long-term current use of insulin ([place]) Patient with a history of diabetes mellitus on insulin and oral agents Noted to have hyperglycemia with blood sugar over 300 Hold glipizide and metformin Continue IV fluid hydration Continue long-acting insulin I will place patient on sliding scale coverage with NovoLog Maintain consistent carbohydrate diet      Advance Care Planning:   Code Status: Full Code   Consults: Physical therapy  Family Communication: Greater than 50% of time was spent discussing plan of care with patient at the bedside.  He lists his son as his healthcare power of attorney.  CODE STATUS was discussed and he wishes to be full code  Severity of Illness: The appropriate patient status for this patient is INPATIENT. Inpatient status is judged to be reasonable and necessary in order to provide the required intensity of service to ensure the patient's safety. The patient's presenting symptoms, physical exam findings, and initial radiographic and laboratory data in the context of their chronic comorbidities is felt to place them at high risk for further clinical  deterioration. Furthermore, it is not anticipated that the patient will be medically stable for discharge from the hospital within 2 midnights of admission.   * I certify that at the point of admission it is my clinical judgment that the patient will require inpatient hospital care spanning beyond 2 midnights from the point of admission due to high intensity of service, high risk for further deterioration and high frequency of surveillance required.*  Author: Collier Bullock, MD 04/03/2022 11:58 AM  For on call review [email or web address].

## 2022-04-03 NOTE — Assessment & Plan Note (Signed)
Patient with a history of diabetes mellitus on insulin and oral agents Noted to have hyperglycemia with blood sugar over 300 Hold glipizide and metformin Continue IV fluid hydration Continue long-acting insulin I will place patient on sliding scale coverage with NovoLog Maintain consistent carbohydrate diet

## 2022-04-03 NOTE — ED Triage Notes (Signed)
Patient presents to the ER via EMS from home after a fall. Patient had a fall last night while trying to get out of his chair and was on the floor all night. Patient c/o right shoulder pain and productive cough.

## 2022-04-03 NOTE — Assessment & Plan Note (Signed)
Treatment as outlined in 1 

## 2022-04-03 NOTE — Assessment & Plan Note (Signed)
Patient presents to the emergency room for evaluation after a fall at home with inability to get up. He lives alone and at baseline ambulates with a cane and sometimes a rolling walker Will place patient on fall precautions Consult physical therapy

## 2022-04-04 DIAGNOSIS — R531 Weakness: Secondary | ICD-10-CM | POA: Diagnosis not present

## 2022-04-04 LAB — BASIC METABOLIC PANEL
Anion gap: 11 (ref 5–15)
BUN: 19 mg/dL (ref 8–23)
CO2: 19 mmol/L — ABNORMAL LOW (ref 22–32)
Calcium: 7.5 mg/dL — ABNORMAL LOW (ref 8.9–10.3)
Chloride: 101 mmol/L (ref 98–111)
Creatinine, Ser: 1.17 mg/dL (ref 0.61–1.24)
GFR, Estimated: >60 mL/min (ref >60)
Glucose, Bld: 236 mg/dL — ABNORMAL HIGH (ref 70–99)
Potassium: 3.1 mmol/L — ABNORMAL LOW (ref 3.5–5.1)
Sodium: 131 mmol/L — ABNORMAL LOW (ref 135–145)

## 2022-04-04 LAB — GLUCOSE, CAPILLARY
Glucose-Capillary: 173 mg/dL — ABNORMAL HIGH (ref 70–99)
Glucose-Capillary: 227 mg/dL — ABNORMAL HIGH (ref 70–99)
Glucose-Capillary: 232 mg/dL — ABNORMAL HIGH (ref 70–99)
Glucose-Capillary: 295 mg/dL — ABNORMAL HIGH (ref 70–99)
Glucose-Capillary: 336 mg/dL — ABNORMAL HIGH (ref 70–99)

## 2022-04-04 LAB — CBC
HCT: 28.4 % — ABNORMAL LOW (ref 39.0–52.0)
Hemoglobin: 9.7 g/dL — ABNORMAL LOW (ref 13.0–17.0)
MCH: 32.3 pg (ref 26.0–34.0)
MCHC: 34.2 g/dL (ref 30.0–36.0)
MCV: 94.7 fL (ref 80.0–100.0)
Platelets: 259 10*3/uL (ref 150–400)
RBC: 3 MIL/uL — ABNORMAL LOW (ref 4.22–5.81)
RDW: 15.9 % — ABNORMAL HIGH (ref 11.5–15.5)
WBC: 11.8 10*3/uL — ABNORMAL HIGH (ref 4.0–10.5)
nRBC: 0.3 % — ABNORMAL HIGH (ref 0.0–0.2)

## 2022-04-04 LAB — BLOOD GAS, VENOUS
Acid-base deficit: 2.5 mmol/L — ABNORMAL HIGH (ref 0.0–2.0)
Bicarbonate: 24.2 mmol/L (ref 20.0–28.0)
O2 Saturation: 29.7 %
Patient temperature: 37
pCO2, Ven: 47 mmHg (ref 44–60)
pH, Ven: 7.32 (ref 7.25–7.43)

## 2022-04-04 LAB — CK: Total CK: 244 U/L (ref 49–397)

## 2022-04-04 MED ORDER — POTASSIUM CHLORIDE 20 MEQ PO PACK
40.0000 meq | PACK | Freq: Once | ORAL | Status: AC
  Administered 2022-04-04: 40 meq via ORAL
  Filled 2022-04-04: qty 2

## 2022-04-04 NOTE — Progress Notes (Signed)
PROGRESS NOTE    ACESYN MAIA  [id number] DOB: [birth year] DOA: 04/03/2022  PCP: Sallee Lange, NP   Brief Narrative:  This 87 yrs old Male with PMH significant for diabetes mellitus, CKD stage IIIa, GERD, coronary artery disease, hypertension presented in the ED s/p fall.  Per EMS patient fell last night prior to his admission and he was unable to get up.  He remained on the floor throughout the night.  Patient stated he fell onto the ground in the morning and thinks it happened at about 8 AM.  Patient does not remember the event surrounding the fall but he stated that he was tangled up with some chair.  He lives alone and had several falls over the last couple of weeks.  At baseline patient ambulates with a cane. He has a life alert and was able to get help after the fall.  Patient reports having shoulder pain and mild cough.  Pertinent labs in the ED include glucose 384, serum creatinine 1.32, troponin 44, total CK 498, WBC 17K.  COVID, Influenza, RSV negative.  Patient was admitted for further evaluation and started on empiric antibiotics for presumed UTI.  Assessment & Plan:   Principal Problem:   Weakness generalized Active Problems:   Uncontrolled type 2 diabetes mellitus with hyperglycemia, with long-term current use of insulin (HCC)   Fall   UTI (urinary tract infection)   Rhabdomyolysis  Generalized weakness: Patient presented to the emergency room for evaluation after a fall at home with inability to get up. He lives alone and at baseline ambulates with a cane and sometimes a rolling walker Continue fall precautions.  PT and OT evaluation.   Rhabdomyolysis: Traumatic rhabdomyolysis disease following a fall (mild) Continue IV fluid resuscitation. Recheck CK level improved. 498 > 244  Urinary tract infection: Patient has a white count of 17,000 and urine analysis is concerning for possible UTI but not really convincing Continue Rocephin 1 g IV daily initiated  in the ER Follow-up results of urine culture   Fall : Fall precautions. PT/OT evaluation   DM II with hyperglycemia: Patient with a history of diabetes mellitus on insulin and oral agents Noted to have hyperglycemia with blood sugar over 300 Hold glipizide and metformin Continue IV fluid hydration Continue long-acting insulin,  I will place patient on sliding scale coverage with NovoLog Maintain consistent carbohydrate diet.   DVT prophylaxis:  Lovenox Code Status: Full code Family Communication: No family at bed side. Disposition Plan:    Status is: Inpatient Remains inpatient appropriate because:  Admitted s/p mechanical fall remained on the floor throughout the night found to have high CK levels consistent with traumatic rhabdomyolysis requiring IV fluid resuscitation.  Consultants:  None  Procedures: None Antimicrobials: None  Subjective: Patient was seen and examined at bedside.  Overnight events noted.   Patient report doing better still feels weak and tired.  Patient denies any burning urination.  Objective: Vitals:   04/04/22 0034 04/04/22 0610 04/04/22 0740 04/04/22 1138  BP: 134/66 (!) 126/59 (!) 140/59 (!) 112/99  Pulse: 70 68 66 76  Resp: 18 18 17 18  $ Temp:   97.7 F (36.5 C) 98.1 F (36.7 C)  SpO2: 97% 96% 97% 98%  Weight:      Height:        Intake/Output Summary (Last 24 hours) at 04/04/2022 1419 Last data filed at 04/04/2022 0757 Gross per 24 hour  Intake 1100 ml  Output 100 ml  Net 1000 ml  Filed Weights   04/03/22 0856  Weight: 80.2 kg    Examination:  General exam: Appears calm and comfortable , very deconditioned. Respiratory system: Clear to auscultation. Respiratory effort normal.  RR 16 Cardiovascular system: S1 & S2 heard, regular rate and rhythm, no murmur. Gastrointestinal system: Abdomen is soft, non tender, non distended, BS+ Central nervous system: Alert and oriented x 2. No focal neurological deficits. Extremities: No  edema, no cyanosis, no clubbing Skin: No rashes, lesions or ulcers Psychiatry:  Mood & affect appropriate.     Data Reviewed: I have personally reviewed following labs and imaging studies  CBC: Recent Labs  Lab 04/03/22 0859 04/04/22 0303  WBC 17.0* 11.8*  NEUTROABS 15.1*  --   HGB 12.4* 9.7*  HCT 36.3* 28.4*  MCV 95.0 94.7  PLT 341 [id number]   Basic Metabolic Panel: Recent Labs  Lab 04/03/22 0859 04/04/22 0303  NA 136 131*  K 3.8 3.1*  CL 102 101  CO2 22 19*  GLUCOSE 384* 236*  BUN 20 19  CREATININE 1.32* 1.17  CALCIUM 8.9 7.5*   GFR: Estimated Creatinine Clearance: 45.5 mL/min (by C-G formula based on SCr of 1.17 mg/dL). Liver Function Tests: Recent Labs  Lab 04/03/22 0859  AST 31  ALT 22  ALKPHOS 81  BILITOT 1.9*  PROT 7.6  ALBUMIN 3.8   No results for input(s): "LIPASE", "AMYLASE" in the last 168 hours. No results for input(s): "AMMONIA" in the last 168 hours. Coagulation Profile: No results for input(s): "INR", "PROTIME" in the last 168 hours. Cardiac Enzymes: Recent Labs  Lab 04/03/22 0930 04/04/22 0303  CKTOTAL 498* 244   BNP (last 3 results) No results for input(s): "PROBNP" in the last 8760 hours. HbA1C: Recent Labs    04/03/22 0859  HGBA1C 9.4*   CBG: Recent Labs  Lab 04/03/22 1535 04/03/22 2131 04/04/22 0039 04/04/22 0737 04/04/22 1139  GLUCAP 196* 365* 336* 173* 227*   Lipid Profile: No results for input(s): "CHOL", "HDL", "LDLCALC", "TRIG", "CHOLHDL", "LDLDIRECT" in the last 72 hours. Thyroid Function Tests: No results for input(s): "TSH", "T4TOTAL", "FREET4", "T3FREE", "THYROIDAB" in the last 72 hours. Anemia Panel: No results for input(s): "VITAMINB12", "FOLATE", "FERRITIN", "TIBC", "IRON", "RETICCTPCT" in the last 72 hours. Sepsis Labs: Recent Labs  Lab 04/03/22 0859 04/03/22 1059  LATICACIDVEN 1.7 0.7    Recent Results (from the past 240 hour(s))  Resp panel by RT-PCR (RSV, Flu A&B, Covid) Anterior Nasal Swab      Status: None   Collection Time: 04/03/22  8:59 AM   Specimen: Anterior Nasal Swab  Result Value Ref Range Status   SARS Coronavirus 2 by RT PCR NEGATIVE NEGATIVE Final    Comment: (NOTE) SARS-CoV-2 target nucleic acids are NOT DETECTED.  The SARS-CoV-2 RNA is generally detectable in upper respiratory specimens during the acute phase of infection. The lowest concentration of SARS-CoV-2 viral copies this assay can detect is 138 copies/mL. A negative result does not preclude SARS-Cov-2 infection and should not be used as the sole basis for treatment or other patient management decisions. A negative result may occur with  improper specimen collection/handling, submission of specimen other than nasopharyngeal swab, presence of viral mutation(s) within the areas targeted by this assay, and inadequate number of viral copies(<138 copies/mL). A negative result must be combined with clinical observations, patient history, and epidemiological information. The expected result is Negative.  Fact Sheet for Patients:  EntrepreneurPulse.com.au  Fact Sheet for Healthcare Providers:  IncredibleEmployment.be  This test is no t yet  approved or cleared by the [place] and  has been authorized for detection and/or diagnosis of SARS-CoV-2 by FDA under an Emergency Use Authorization (EUA). This EUA will remain  in effect (meaning this test can be used) for the duration of the COVID-19 declaration under Section 564(b)(1) of the Act, 21 U.S.C.section 360bbb-3(b)(1), unless the authorization is terminated  or revoked sooner.       Influenza A by PCR NEGATIVE NEGATIVE Final   Influenza B by PCR NEGATIVE NEGATIVE Final    Comment: (NOTE) The Xpert Xpress SARS-CoV-2/FLU/RSV plus assay is intended as an aid in the diagnosis of influenza from Nasopharyngeal swab specimens and should not be used as a sole basis for treatment. Nasal washings and aspirates are  unacceptable for Xpert Xpress SARS-CoV-2/FLU/RSV testing.  Fact Sheet for Patients: EntrepreneurPulse.com.au  Fact Sheet for Healthcare Providers: IncredibleEmployment.be  This test is not yet approved or cleared by the [place] FDA and has been authorized for detection and/or diagnosis of SARS-CoV-2 by FDA under an Emergency Use Authorization (EUA). This EUA will remain in effect (meaning this test can be used) for the duration of the COVID-19 declaration under Section 564(b)(1) of the Act, 21 U.S.C. section 360bbb-3(b)(1), unless the authorization is terminated or revoked.     Resp Syncytial Virus by PCR NEGATIVE NEGATIVE Final    Comment: (NOTE) Fact Sheet for Patients: EntrepreneurPulse.com.au  Fact Sheet for Healthcare Providers: IncredibleEmployment.be  This test is not yet approved or cleared by the [place] FDA and has been authorized for detection and/or diagnosis of SARS-CoV-2 by FDA under an Emergency Use Authorization (EUA). This EUA will remain in effect (meaning this test can be used) for the duration of the COVID-19 declaration under Section 564(b)(1) of the Act, 21 U.S.C. section 360bbb-3(b)(1), unless the authorization is terminated or revoked.  Performed at [facility], [address], [place], [place] [address]     Radiology Studies: DG Chest 2 View  Result Date: 04/03/2022 CLINICAL DATA:  Cough EXAM: CHEST - 2 VIEW COMPARISON:  None available. FINDINGS: The heart size and mediastinal contours are within normal limits. Both lungs are clear. No pneumothorax or pleural effusion. Aorta is calcified. There are thoracic spine and bilateral shoulder degenerative changes. IMPRESSION: No active cardiopulmonary disease. Electronically Signed   By: Sammie Bench M.D.   On: 04/03/2022 09:32    Scheduled Meds:  amLODipine  10 mg Oral Daily   aspirin EC  81 mg Oral Daily    enoxaparin (LOVENOX) injection  40 mg Subcutaneous Q24H   gabapentin  100 mg Oral Daily   insulin aspart  0-15 Units Subcutaneous TID WC   insulin glargine-yfgn  50 Units Subcutaneous Daily   metoprolol succinate  25 mg Oral Daily   rosuvastatin  5 mg Oral QODAY   Continuous Infusions:  cefTRIAXone (ROCEPHIN)  IV 1 g (04/04/22 1233)     LOS: 1 day    Time spent: 50 mins    Prospero Mahnke, MD [facility]   If 7PM-7AM, please contact night-coverage
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
## 2022-04-04 NOTE — TOC Initial Note (Addendum)
Transition of Care [facility]) - Initial/Assessment Note    Patient Details  Name: Gregory Tran MRN: [id number] Date of Birth: February 25, 1935  [facility] of [facility]) CM/SW Contact:    Gerilyn Pilgrim, LCSW Phone Number: 04/04/2022, 11:48 AM  Clinical Narrative:  CSW spoke with pt regarding SNF. Pt declines SNF at this time and states he wants to return home with [facility] and his 3 cats. Pt has support of  his Niece Gregory Tran who comes and checks on him daily. Pt reports that he has three walkers that he walks with at home including a rollator. Pt reports he would like to use [facility] for PT/OT/RN/AID. CSW will reach out to [facility] for [facility].              2:47pm  CSW spoke with Gregory Tran who assists with pt is very worried about pt. Pt stopped taking all of his meds cold [place]. Gregory Tran would like to call and talk to pt as she is not very available to assist and feels the best plan for him is to go to rehab as his son lives in [place] and no one but her is able to assist.       Patient Goals and CMS Choice            Expected Discharge Plan and Services                                              Prior Living Arrangements/Services                       Activities of Daily Living Home Assistive Devices/Equipment: Eyeglasses ADL Screening (condition at time of admission) Patient's cognitive ability adequate to safely complete daily activities?: Yes Is the patient deaf or have difficulty hearing?: No Does the patient have difficulty seeing, even when wearing glasses/contacts?: No Does the patient have difficulty concentrating, remembering, or making decisions?: No Patient able to express need for assistance with ADLs?: Yes Does the patient have difficulty dressing or bathing?: Yes Independently performs ADLs?: No Communication: Independent Dressing (OT): Independent Grooming: Independent Feeding: Independent Bathing: Needs assistance Is this a change from baseline?:  Change from baseline, expected to last >3 days Toileting: Needs assistance Is this a change from baseline?: Change from baseline, expected to last >3days In/Out Bed: Needs assistance Is this a change from baseline?: Change from baseline, expected to last >3 days Walks in Home: Needs assistance Is this a change from baseline?: Change from baseline, expected to last >3 days Does the patient have difficulty walking or climbing stairs?: Yes Weakness of Legs: Both Weakness of Arms/Hands: None  Permission Sought/Granted                  Emotional Assessment              Admission diagnosis:  Weakness generalized [R53.1] Weakness [R53.1] Non-traumatic rhabdomyolysis [M62.82] Patient Active Problem List   Diagnosis Date Noted   Weakness generalized 04/03/2022   Fall 04/03/2022   UTI (urinary tract infection) 04/03/2022   Rhabdomyolysis 04/03/2022   Other fatigue 12/15/2019   B12 deficiency 04/15/2018   Normocytic anemia 04/01/2018   BPH (benign prostatic hypertrophy) with urinary obstruction 07/09/2015   Benign essential hypertension 06/14/2015   Benign fibroma of prostate 05/02/2015   Arteriosclerosis of coronary artery 05/02/2015   Chronic LBP 05/02/2015  Uncontrolled type 2 diabetes mellitus with hyperglycemia, with long-term current use of insulin ([place]) 05/02/2015   Gastro-esophageal reflux disease without esophagitis 05/02/2015   HLD (hyperlipidemia) 05/02/2015   BP (high blood pressure) 05/02/2015   Anemia, iron deficiency 05/02/2015   Contracture of palmar fascia (Dupuytren's) 04/10/2015   Impingement syndrome of left shoulder 03/26/2015   Pain in shoulder 01/23/2015   BPH with obstruction/lower urinary tract symptoms 10/16/2014   Ejaculatory disorder 10/16/2014   Low back pain with sciatica 04/10/2014   PCP:  Sallee Lange, NP Pharmacy:   [facility] [place], [place] - [facility] [address] AT [place] [facility] [place] [address] Phone: [phone] Fax: [phone]     Social Determinants of Health (SDOH) Social History: SDOH Screenings   Food Insecurity: No Food Insecurity (04/03/2022)  Housing: Low Risk  (04/03/2022)  Transportation Needs: No Transportation Needs (04/03/2022)  Utilities: Not At Risk (04/03/2022)  Tobacco Use: Medium Risk (04/03/2022)   SDOH Interventions:     Readmission Risk Interventions     No data to display

## 2022-04-05 DIAGNOSIS — R531 Weakness: Secondary | ICD-10-CM | POA: Diagnosis not present

## 2022-04-05 LAB — CBC
HCT: 32.3 % — ABNORMAL LOW (ref 39.0–52.0)
Hemoglobin: 11.2 g/dL — ABNORMAL LOW (ref 13.0–17.0)
MCH: 32.3 pg (ref 26.0–34.0)
MCHC: 34.7 g/dL (ref 30.0–36.0)
MCV: 93.1 fL (ref 80.0–100.0)
Platelets: 301 10*3/uL (ref 150–400)
RBC: 3.47 MIL/uL — ABNORMAL LOW (ref 4.22–5.81)
RDW: 15.9 % — ABNORMAL HIGH (ref 11.5–15.5)
WBC: 6.5 10*3/uL (ref 4.0–10.5)
nRBC: 0.3 % — ABNORMAL HIGH (ref 0.0–0.2)

## 2022-04-05 LAB — BASIC METABOLIC PANEL
Anion gap: 8 (ref 5–15)
BUN: 20 mg/dL (ref 8–23)
CO2: 24 mmol/L (ref 22–32)
Calcium: 8.5 mg/dL — ABNORMAL LOW (ref 8.9–10.3)
Chloride: 107 mmol/L (ref 98–111)
Creatinine, Ser: 1.11 mg/dL (ref 0.61–1.24)
GFR, Estimated: >60 mL/min (ref >60)
Glucose, Bld: 203 mg/dL — ABNORMAL HIGH (ref 70–99)
Potassium: 3.5 mmol/L (ref 3.5–5.1)
Sodium: 136 mmol/L (ref 135–145)

## 2022-04-05 LAB — GLUCOSE, CAPILLARY
Glucose-Capillary: 114 mg/dL — ABNORMAL HIGH (ref 70–99)
Glucose-Capillary: 379 mg/dL — ABNORMAL HIGH (ref 70–99)

## 2022-04-05 MED ORDER — CEPHALEXIN 500 MG PO CAPS: 500.0000 mg | ORAL_CAPSULE | Freq: Two times a day (BID) | ORAL | 0 refills | Status: AC

## 2022-04-05 MED ORDER — GABAPENTIN 100 MG PO CAPS: ORAL_CAPSULE | ORAL | 0 refills | Status: DC

## 2022-04-05 NOTE — Plan of Care (Signed)

## 2022-04-05 NOTE — Discharge Summary (Signed)
Physician Discharge Summary  DEARON DEMA [id number] DOB: August 05, 1935 DOA: 04/03/2022  PCP: Sallee Lange, NP  Admit date: 04/03/2022  Discharge date: 04/05/2022  Admitted From: Home  Disposition:  [place].  Recommendations for Outpatient Follow-up:  Follow up with PCP in 1-2 weeks Please obtain BMP/CBC in one week Advised to take Keflex 500 mg twice daily for 5 days for UTI  Home Health: Home health PT/OT Equipment/Devices: None  Discharge Condition: Stable CODE STATUS:Full code Diet recommendation: Heart Healthy   Brief [facility] Course: This 87 yrs old Male with PMH significant for diabetes mellitus, CKD stage IIIa, GERD, coronary artery disease, hypertension presented in the ED s/p fall.  Per EMS patient fell last night prior to his admission and he was unable to get up. He remained on the floor throughout the night.  Patient stated he fell onto the ground in the morning and thinks it happened at about 8 AM.  Patient does not remember the event surrounding the fall but he stated that he was tangled up with some chair.  He lives alone and had several falls over the last couple of weeks.  At baseline Patient ambulates with a cane. He has a life alert and was able to get help after the fall.  Patient reports having shoulder pain and mild cough.  Pertinent labs in the ED include glucose 384, serum creatinine 1.32, troponin 44, total CK 498, WBC 17K.  COVID, Influenza, RSV negative. Patient was admitted for further evaluation and started on empiric antibiotics for presumed UTI.  Patient was continued on IV fluid resuscitation.  CK level has improved with IV fluids.  Urine cultures no significant growth.  PT and OT recommended skilled nursing facility.Patient wants to go home.  Patient has 24/7 care provided by her niece.  Home health services arranged. Patient is being discharged home.  Discharge Diagnoses:  Principal Problem:   Weakness generalized Active  Problems:   Uncontrolled type 2 diabetes mellitus with hyperglycemia, with long-term current use of insulin (HCC)   Fall   UTI (urinary tract infection)   Rhabdomyolysis  Generalized weakness: Patient presented to the emergency room for evaluation after a fall at home with inability to get up. He lives alone and at baseline ambulates with a cane and sometimes a rolling walker Continue fall precautions.  PT and OT recommended SNF. Patient wants to be discharged.  Home and services arranged.   Rhabdomyolysis:> Improved. Traumatic rhabdomyolysis disease following a fall (mild) Continue IV fluid resuscitation. Recheck CK level improved. 498 > 244   Urinary tract infection: Patient has a white count of 17,000 and urine analysis is concerning for possible UTI but not really convincing Continue Rocephin 1 g IV daily initiated in the ER Urine cultures not significant growth. Patient being discharged home Keflex twice a day for 5 days.   Fall : Fall precautions. PT/OT > SNF> HHS    DM II with hyperglycemia: Patient with a history of diabetes mellitus on insulin and oral agents Noted to have hyperglycemia with blood sugar over 300 Hold glipizide and metformin Continue IV fluid hydration Continue long-acting insulin,  I will place patient on sliding scale coverage with NovoLog Maintain consistent carbohydrate diet.    Discharge Instructions  Discharge Instructions     Call MD for:  difficulty breathing, headache or visual disturbances   Complete by: As directed    Call MD for:  persistant dizziness or light-headedness   Complete by: As directed    Call  MD for:  persistant nausea and vomiting   Complete by: As directed    Diet - low sodium heart healthy   Complete by: As directed    Diet Carb Modified   Complete by: As directed    Discharge instructions   Complete by: As directed    Advised to follow-up with primary care physician in 1 week. Advised to take Keflex 500 mg  twice daily for 5 days for UTI. Home health services been arranged.   Increase activity slowly   Complete by: As directed       Allergies as of 04/05/2022       Reactions   Atorvastatin Other (See Comments)   B Complex-folic Acid Hives        Medication List     TAKE these medications    amLODipine 10 MG tablet Commonly known as: NORVASC Take 10 mg by mouth daily.   ammonium lactate 12 % cream Commonly known as: AMLACTIN Apply 1 Application topically 2 (two) times daily. (Apply to back)   aspirin EC 81 MG tablet Take 81 mg by mouth daily.   cephALEXin 500 MG capsule Commonly known as: KEFLEX Take 1 capsule (500 mg total) by mouth 2 (two) times daily for 5 days.   gabapentin 100 MG capsule Commonly known as: NEURONTIN Take 1 capsule by mouth every morning and take 3 capsules by mouth every night at bedtime.   glipiZIDE 10 MG 24 hr tablet Commonly known as: GLUCOTROL XL Take 10 mg by mouth daily with breakfast.   insulin aspart 100 UNIT/ML FlexPen Commonly known as: NOVOLOG Inject 22 Units into the skin 2 (two) times daily before a meal.   insulin degludec 100 UNIT/ML FlexTouch Pen Commonly known as: TRESIBA Inject 50 Units into the skin daily.   metFORMIN 1000 MG tablet Commonly known as: GLUCOPHAGE Take 1,000 mg by mouth daily.   metoprolol succinate 50 MG 24 hr tablet Commonly known as: TOPROL-XL Take 25 mg by mouth daily.   pantoprazole 40 MG tablet Commonly known as: PROTONIX Take 40 mg by mouth daily.   rosuvastatin 5 MG tablet Commonly known as: CRESTOR Take 5 mg by mouth every other day.   tamsulosin 0.4 MG Caps capsule Commonly known as: FLOMAX TAKE 1 CAPSULE(0.4 MG) BY MOUTH DAILY        Follow-up Information     Gauger, Victoriano Lain, NP Follow up in 1 week(s).   Specialty: Internal Medicine Contact information: [place] [address] [phone]                 Allergies  Allergen Reactions   Atorvastatin Other (See Comments)   B Complex-Folic Acid Hives    Consultations: None   Procedures/Studies: DG Chest 2 View  Result Date: 04/03/2022 CLINICAL DATA:  Cough EXAM: CHEST - 2 VIEW COMPARISON:  None available. FINDINGS: The heart size and mediastinal contours are within normal limits. Both lungs are clear. No pneumothorax or pleural effusion. Aorta is calcified. There are thoracic spine and bilateral shoulder degenerative changes. IMPRESSION: No active cardiopulmonary disease. Electronically Signed   By: Sammie Bench M.D.   On: 04/03/2022 09:32     Subjective: Patient was seen and examined at bedside.  Overnight events noted.   Patient report doing much better and wants to be discharged.   Patient reports having mild urinary symptoms with urine culture insignificant growth.   Patient discharged on Keflex for 5 days.  Discharge Exam: Vitals:   04/05/22 0804 04/05/22 0814  BP: (!) 115/54 132/64  Pulse: 73 74  Resp: 16 16  Temp: 98 F (36.7 C) 98.6 F (37 C)  SpO2: 95% 96%   Vitals:   04/05/22 0025 04/05/22 0440 04/05/22 0804 04/05/22 0814  BP: 138/70 (!) 143/64 (!) 115/54 132/64  Pulse: 70 64 73 74  Resp: 16 18 16 16  $ Temp: 98.5 F (36.9 C) 97.7 F (36.5 C) 98 F (36.7 C) 98.6 F (37 C)  TempSrc: Oral Oral    SpO2: 97% 95% 95% 96%  Weight:      Height:        General: Pt is alert, awake, not in acute distress Cardiovascular: RRR, S1/S2 +, no rubs, no gallops Respiratory: CTA bilaterally, no wheezing, no rhonchi Abdominal: Soft, NT, ND, bowel sounds + Extremities: no edema, no cyanosis    The results of significant diagnostics from this hospitalization (including imaging, microbiology, ancillary and laboratory) are listed below for reference.     Microbiology: Recent Results (from the past 240 hour(s))  Resp panel by RT-PCR (RSV, Flu A&B, Covid) Anterior Nasal Swab     Status: None   Collection Time:  04/03/22  8:59 AM   Specimen: Anterior Nasal Swab  Result Value Ref Range Status   SARS Coronavirus 2 by RT PCR NEGATIVE NEGATIVE Final    Comment: (NOTE) SARS-CoV-2 target nucleic acids are NOT DETECTED.  The SARS-CoV-2 RNA is generally detectable in upper respiratory specimens during the acute phase of infection. The lowest concentration of SARS-CoV-2 viral copies this assay can detect is 138 copies/mL. A negative result does not preclude SARS-Cov-2 infection and should not be used as the sole basis for treatment or other patient management decisions. A negative result may occur with  improper specimen collection/handling, submission of specimen other than nasopharyngeal swab, presence of viral mutation(s) within the areas targeted by this assay, and inadequate number of viral copies(<138 copies/mL). A negative result must be combined with clinical observations, patient history, and epidemiological information. The expected result is Negative.  Fact Sheet for Patients:  EntrepreneurPulse.com.au  Fact Sheet for Healthcare Providers:  IncredibleEmployment.be  This test is no t yet approved or cleared by the [place] FDA and  has been authorized for detection and/or diagnosis of SARS-CoV-2 by FDA under an Emergency Use Authorization (EUA). This EUA will remain  in effect (meaning this test can be used) for the duration of the COVID-19 declaration under Section 564(b)(1) of the Act, 21 U.S.C.section 360bbb-3(b)(1), unless the authorization is terminated  or revoked sooner.       Influenza A by PCR NEGATIVE NEGATIVE Final   Influenza B by PCR NEGATIVE NEGATIVE Final    Comment: (NOTE) The Xpert Xpress SARS-CoV-2/FLU/RSV plus assay is intended as an aid in the diagnosis of influenza from Nasopharyngeal swab specimens and should not be used as a sole basis for treatment. Nasal washings and aspirates are unacceptable for Xpert Xpress  SARS-CoV-2/FLU/RSV testing.  Fact Sheet for Patients: EntrepreneurPulse.com.au  Fact Sheet for Healthcare Providers: IncredibleEmployment.be  This test is not yet approved or cleared by the [place] FDA and has been authorized for detection and/or diagnosis of SARS-CoV-2 by FDA under an Emergency Use Authorization (EUA). This EUA will remain in effect (meaning this test can be used) for the duration of the COVID-19 declaration under Section 564(b)(1) of the Act, 21 U.S.C. section 360bbb-3(b)(1), unless the authorization is terminated or revoked.     Resp Syncytial Virus  by PCR NEGATIVE NEGATIVE Final    Comment: (NOTE) Fact Sheet for Patients: EntrepreneurPulse.com.au  Fact Sheet for Healthcare Providers: IncredibleEmployment.be  This test is not yet approved or cleared by the [place] FDA and has been authorized for detection and/or diagnosis of SARS-CoV-2 by FDA under an Emergency Use Authorization (EUA). This EUA will remain in effect (meaning this test can be used) for the duration of the COVID-19 declaration under Section 564(b)(1) of the Act, 21 U.S.C. section 360bbb-3(b)(1), unless the authorization is terminated or revoked.  Performed at [facility], [address] [place], [place] [address]      Labs: BNP (last 3 results) No results for input(s): "BNP" in the last 8760 hours. Basic Metabolic Panel: Recent Labs  Lab 04/03/22 0859 04/04/22 0303 04/05/22 0903  NA 136 131* 136  K 3.8 3.1* 3.5  CL 102 101 107  CO2 22 19* 24  GLUCOSE 384* 236* 203*  BUN 20 19 20  $ CREATININE 1.32* 1.17 1.11  CALCIUM 8.9 7.5* 8.5*   Liver Function Tests: Recent Labs  Lab 04/03/22 0859  AST 31  ALT 22  ALKPHOS 81  BILITOT 1.9*  PROT 7.6  ALBUMIN 3.8   No results for input(s): "LIPASE", "AMYLASE" in the last 168 hours. No results for input(s): "AMMONIA" in the last 168  hours. CBC: Recent Labs  Lab 04/03/22 0859 04/04/22 0303 04/05/22 0903  WBC 17.0* 11.8* 6.5  NEUTROABS 15.1*  --   --   HGB 12.4* 9.7* 11.2*  HCT 36.3* 28.4* 32.3*  MCV 95.0 94.7 93.1  PLT 341 259 301   Cardiac Enzymes: Recent Labs  Lab 04/03/22 0930 04/04/22 0303  CKTOTAL 498* 244   BNP: Invalid input(s): "POCBNP" CBG: Recent Labs  Lab 04/04/22 1139 04/04/22 1627 04/04/22 2039 04/05/22 0806 04/05/22 1229  GLUCAP 227* 295* 232* 114* 379*   D-Dimer No results for input(s): "DDIMER" in the last 72 hours. Hgb A1c Recent Labs    04/03/22 0859  HGBA1C 9.4*   Lipid Profile No results for input(s): "CHOL", "HDL", "LDLCALC", "TRIG", "CHOLHDL", "LDLDIRECT" in the last 72 hours. Thyroid function studies No results for input(s): "TSH", "T4TOTAL", "T3FREE", "THYROIDAB" in the last 72 hours.  Invalid input(s): "FREET3" Anemia work up No results for input(s): "VITAMINB12", "FOLATE", "FERRITIN", "TIBC", "IRON", "RETICCTPCT" in the last 72 hours. Urinalysis    Component Value Date/Time   COLORURINE YELLOW (A) 04/03/2022 0900   APPEARANCEUR HAZY (A) 04/03/2022 0900   APPEARANCEUR Clear 06/07/2015 0850   LABSPEC 1.024 04/03/2022 0900   PHURINE 5.0 04/03/2022 0900   GLUCOSEU >=500 (A) 04/03/2022 0900   HGBUR LARGE (A) 04/03/2022 0900   BILIRUBINUR NEGATIVE 04/03/2022 0900   BILIRUBINUR Negative 06/07/2015 0850   KETONESUR 20 (A) 04/03/2022 0900   PROTEINUR 100 (A) 04/03/2022 0900   NITRITE NEGATIVE 04/03/2022 0900   LEUKOCYTESUR TRACE (A) 04/03/2022 0900   Sepsis Labs Recent Labs  Lab 04/03/22 0859 04/04/22 0303 04/05/22 0903  WBC 17.0* 11.8* 6.5   Microbiology Recent Results (from the past 240 hour(s))  Resp panel by RT-PCR (RSV, Flu A&B, Covid) Anterior Nasal Swab     Status: None   Collection Time: 04/03/22  8:59 AM   Specimen: Anterior Nasal Swab  Result Value Ref Range Status   SARS Coronavirus 2 by RT PCR NEGATIVE NEGATIVE Final    Comment:  (NOTE) SARS-CoV-2 target nucleic acids are NOT DETECTED.  The SARS-CoV-2 RNA is generally detectable in upper respiratory specimens during the acute phase of infection.  The lowest concentration of SARS-CoV-2 viral copies this assay can detect is 138 copies/mL. A negative result does not preclude SARS-Cov-2 infection and should not be used as the sole basis for treatment or other patient management decisions. A negative result may occur with  improper specimen collection/handling, submission of specimen other than nasopharyngeal swab, presence of viral mutation(s) within the areas targeted by this assay, and inadequate number of viral copies(<138 copies/mL). A negative result must be combined with clinical observations, patient history, and epidemiological information. The expected result is Negative.  Fact Sheet for Patients:  EntrepreneurPulse.com.au  Fact Sheet for Healthcare Providers:  IncredibleEmployment.be  This test is no t yet approved or cleared by the [place] FDA and  has been authorized for detection and/or diagnosis of SARS-CoV-2 by FDA under an Emergency Use Authorization (EUA). This EUA will remain  in effect (meaning this test can be used) for the duration of the COVID-19 declaration under Section 564(b)(1) of the Act, 21 U.S.C.section 360bbb-3(b)(1), unless the authorization is terminated  or revoked sooner.       Influenza A by PCR NEGATIVE NEGATIVE Final   Influenza B by PCR NEGATIVE NEGATIVE Final    Comment: (NOTE) The Xpert Xpress SARS-CoV-2/FLU/RSV plus assay is intended as an aid in the diagnosis of influenza from Nasopharyngeal swab specimens and should not be used as a sole basis for treatment. Nasal washings and aspirates are unacceptable for Xpert Xpress SARS-CoV-2/FLU/RSV testing.  Fact Sheet for Patients: EntrepreneurPulse.com.au  Fact Sheet for Healthcare  Providers: IncredibleEmployment.be  This test is not yet approved or cleared by the [place] FDA and has been authorized for detection and/or diagnosis of SARS-CoV-2 by FDA under an Emergency Use Authorization (EUA). This EUA will remain in effect (meaning this test can be used) for the duration of the COVID-19 declaration under Section 564(b)(1) of the Act, 21 U.S.C. section 360bbb-3(b)(1), unless the authorization is terminated or revoked.     Resp Syncytial Virus by PCR NEGATIVE NEGATIVE Final    Comment: (NOTE) Fact Sheet for Patients: EntrepreneurPulse.com.au  Fact Sheet for Healthcare Providers: IncredibleEmployment.be  This test is not yet approved or cleared by the [place] FDA and has been authorized for detection and/or diagnosis of SARS-CoV-2 by FDA under an Emergency Use Authorization (EUA). This EUA will remain in effect (meaning this test can be used) for the duration of the COVID-19 declaration under Section 564(b)(1) of the Act, 21 U.S.C. section 360bbb-3(b)(1), unless the authorization is terminated or revoked.  Performed at [facility], [address], [place], [place] [address]      Time coordinating discharge: Over 30 minutes  SIGNED:   Shawna Clamp, MD  [facility] 04/05/2022, 2:36 PM Pager   If 7PM-7AM, please contact night-coverage

## 2022-04-05 NOTE — Progress Notes (Signed)
Physical Therapy Treatment Patient Details Name: Gregory Tran MRN: [id number] DOB: 16-Jan-1936 Today's Date: 04/05/2022   History of Present Illness 87 y.o. male with medical history significant for diabetes mellitus with complications of stage IIIa chronic kidney disease, GERD, coronary artery disease, hypertension who was brought into the ER by EMS after a fall.    PT Comments    Pt willing to work with PT to show that he is safe to go home.  He did better than prior session and was able to circumambulate the nurses' station (>100 ft w/o AD) and negotiate up/down steps - both w/o direct assist, no overt LOB but general unsteadiness with continued guarded/choppy steps and need for consistent cuing.  Pt's mobility is improved and seems appropriate for home but Pt stressed multiple times that increased assistance/check-ins/etc are highly recommended.  As he is refusing STR recommending HHPT.  Recommendations for follow up therapy are one component of a multi-disciplinary discharge planning process, led by the attending physician.  Recommendations may be updated based on patient status, additional functional criteria and insurance authorization.  Follow Up Recommendations  Skilled nursing-short term rehab (<3 hours/day) Can patient physically be transported by private vehicle: Yes   Assistance Recommended at Discharge Frequent or constant Supervision/Assistance (pt reports that his niece will be able to be around more if needed?)  Patient can return home with the following A little help with bathing/dressing/bathroom;Assistance with cooking/housework;Assist for transportation;Help with stairs or ramp for entrance   Equipment Recommendations  None recommended by PT    Recommendations for Other Services       Precautions / Restrictions Precautions Precautions: Fall Restrictions Weight Bearing Restrictions: No     Mobility  Bed Mobility Overal bed mobility: Needs Assistance Bed  Mobility: Supine to Sit, Sit to Supine     Supine to sit: Min guard Sit to supine: Min guard   General bed mobility comments: Pt c/o the bed being too squishy, slow to get to EOB, heavy use of rails but able to do so w/o direct assist.  Similarly struggled but not needing assist to get back to bed post session.  sleeps in recliner at home, did not want to get into recliner "It's so uncomfortable"    Transfers Overall transfer level: Needs assistance Equipment used: Rolling walker (2 wheels) Transfers: Sit to/from Stand Sit to Stand: Min guard           General transfer comment: unable to rise to standing on first attempt (self selects hands on walker), cuing for appropriate set up and UE use and he was able to rise w/ only CGA w/o physical assist    Ambulation/Gait Ambulation/Gait assistance: Min assist Gait Distance (Feet): 300 Feet Assistive device: Rolling walker (2 wheels), None         General Gait Details: Pt again with short/choppy steps, better able to lengthen with a lot of cuing today, though still self selects truncated cadence.  Pt did not have any LOBs and showed increased speed and confidence with increased distance.  first 150 ft with RW, transitioned to single rail for ~25 ft and then final ~100 ft w/o AD.  He had no LOBs with the effort, minimal fatigue, reports feeling "about 85% back to normal"   Stairs Stairs: Yes Stairs assistance: Min guard Stair Management: One rail Right Number of Stairs: 6 General stair comments: Pt was able to negotiate up/down steps without physical assist, definite reliance on rail/UEs and some minimal cuing but showed good relative  safety with step-to gait   Wheelchair Mobility    Modified Rankin (Stroke Patients Only)       Balance Overall balance assessment: Needs assistance Sitting-balance support: Bilateral upper extremity supported, Feet supported Sitting balance-Leahy Scale: Normal     Standing balance support:  Bilateral upper extremity supported, No upper extremity supported, During functional activity Standing balance-Leahy Scale: Fair Standing balance comment: no LOBs t/o prolonged ambulation effort, able to ambulate >100 ft w/o AD with slow but steady effort                            Cognition Arousal/Alertness: Awake/alert Behavior During Therapy: WFL for tasks assessed/performed Overall Cognitive Status: Within Functional Limits for tasks assessed                                          Exercises      General Comments General comments (skin integrity, edema, etc.): Pt showed more general awareness and mobility today, however still with decreased safety/deficit awareness      Pertinent Vitals/Pain Pain Assessment Pain Assessment: Faces Faces Pain Scale: Hurts a little bit Pain Location: R shoulder    Home Living                          Prior Function            PT Goals (current goals can now be found in the care plan section) Progress towards PT goals: Progressing toward goals    Frequency    Min 2X/week      PT Plan Current plan remains appropriate    Co-evaluation              AM-PAC PT "6 Clicks" Mobility   Outcome Measure  Help needed turning from your back to your side while in a flat bed without using bedrails?: A Little Help needed moving from lying on your back to sitting on the side of a flat bed without using bedrails?: A Little Help needed moving to and from a bed to a chair (including a wheelchair)?: A Little Help needed standing up from a chair using your arms (e.g., wheelchair or bedside chair)?: A Little Help needed to walk in hospital room?: A Little Help needed climbing 3-5 steps with a railing? : A Little 6 Click Score: 18    End of Session Equipment Utilized During Treatment: Gait belt Activity Tolerance: Patient tolerated treatment well Patient left: with bed alarm set;with call bell/phone  within reach   PT Visit Diagnosis: Unsteadiness on feet (R26.81);Muscle weakness (generalized) (M62.81);Difficulty in walking, not elsewhere classified (R26.2);Repeated falls (R29.6)     Time: [id number] PT Time Calculation (min) (ACUTE ONLY): 33 min  Charges:  $Gait Training: 8-22 mins $Therapeutic Activity: 8-22 mins                     Kreg Shropshire, DPT 04/05/2022, 1:04 PM

## 2022-04-05 NOTE — Discharge Instructions (Addendum)
Advised to take Keflex 500 mg twice daily for 5 days for UTI. Home health services been arranged.  Jason with [facility] able to accept per TOC.

## 2022-04-05 NOTE — TOC Transition Note (Addendum)
Transition of Care [facility]) - CM/SW Discharge Note   Patient Details  Name: Gregory Tran MRN: [id number] Date of Birth: 1935-12-31  Transition of Care [facility]) CM/SW Contact:  Tiburcio Bash, LCSW Phone Number: 04/05/2022, 11:20 AM   Clinical Narrative:     Patient continues to refuse snf, niece [name] aware. She reports she can pick patient up in 3 hours for discharge, agreeable to [name] referral given to [name] cannot accept due to staffing, referral given to [facility] with [facility] able to accept.    No further dc needs.   Final next level of care: Home w Home Health Services Barriers to Discharge: No Barriers Identified   Patient Goals and CMS Choice CMS Medicare.gov Compare Post Acute Care list provided to:: Patient    Discharge Placement                         Discharge Plan and Services Additional resources added to the After Visit Summary for                                       Social Determinants of Health (SDOH) Interventions SDOH Screenings   Food Insecurity: No Food Insecurity (04/03/2022)  Housing: Low Risk  (04/03/2022)  Transportation Needs: No Transportation Needs (04/03/2022)  Utilities: Not At Risk (04/03/2022)  Tobacco Use: Medium Risk (04/03/2022)     Readmission Risk Interventions     No data to display

## 2022-05-05 ENCOUNTER — Observation Stay
Admission: EM | Admit: 2022-05-05 | Discharge: 2022-05-06 | Disposition: A | Discharge status: Home / Self Care | Payer: Medicare Other | Attending: Obstetrics and Gynecology | Admitting: Obstetrics and Gynecology

## 2022-05-05 ENCOUNTER — Emergency Department: Payer: Medicare Other

## 2022-05-05 DIAGNOSIS — N138 Other obstructive and reflux uropathy: Secondary | ICD-10-CM | POA: Diagnosis present

## 2022-05-05 DIAGNOSIS — N401 Enlarged prostate with lower urinary tract symptoms: Secondary | ICD-10-CM | POA: Diagnosis not present

## 2022-05-05 DIAGNOSIS — I10 Essential (primary) hypertension: Secondary | ICD-10-CM | POA: Diagnosis present

## 2022-05-05 DIAGNOSIS — Z794 Long term (current) use of insulin: Secondary | ICD-10-CM

## 2022-05-05 DIAGNOSIS — I2510 Atherosclerotic heart disease of native coronary artery without angina pectoris: Secondary | ICD-10-CM | POA: Diagnosis present

## 2022-05-05 DIAGNOSIS — K920 Hematemesis: Principal | ICD-10-CM

## 2022-05-05 DIAGNOSIS — D649 Anemia, unspecified: Secondary | ICD-10-CM | POA: Diagnosis not present

## 2022-05-05 DIAGNOSIS — E119 Type 2 diabetes mellitus without complications: Secondary | ICD-10-CM | POA: Insufficient documentation

## 2022-05-05 DIAGNOSIS — K5641 Fecal impaction: Secondary | ICD-10-CM

## 2022-05-05 DIAGNOSIS — T17908A Unspecified foreign body in respiratory tract, part unspecified causing other injury, initial encounter: Secondary | ICD-10-CM

## 2022-05-05 DIAGNOSIS — J690 Pneumonitis due to inhalation of food and vomit: Secondary | ICD-10-CM | POA: Diagnosis present

## 2022-05-05 DIAGNOSIS — R197 Diarrhea, unspecified: Secondary | ICD-10-CM | POA: Diagnosis present

## 2022-05-05 DIAGNOSIS — K5900 Constipation, unspecified: Secondary | ICD-10-CM

## 2022-05-05 DIAGNOSIS — N179 Acute kidney failure, unspecified: Secondary | ICD-10-CM

## 2022-05-05 DIAGNOSIS — E11649 Type 2 diabetes mellitus with hypoglycemia without coma: Secondary | ICD-10-CM

## 2022-05-05 LAB — COMPREHENSIVE METABOLIC PANEL
ALT: 19 U/L (ref 0–44)
AST: 32 U/L (ref 15–41)
Albumin: 3.9 g/dL (ref 3.5–5.0)
Alkaline Phosphatase: 68 U/L (ref 38–126)
Anion gap: 14 (ref 5–15)
BUN: 23 mg/dL (ref 8–23)
CO2: 21 mmol/L — ABNORMAL LOW (ref 22–32)
Calcium: 9.7 mg/dL (ref 8.9–10.3)
Chloride: 102 mmol/L (ref 98–111)
Creatinine, Ser: 1.65 mg/dL — ABNORMAL HIGH (ref 0.61–1.24)
GFR, Estimated: 41 mL/min — ABNORMAL LOW (ref >60)
Glucose, Bld: 131 mg/dL — ABNORMAL HIGH (ref 70–99)
Potassium: 4.1 mmol/L (ref 3.5–5.1)
Sodium: 137 mmol/L (ref 135–145)
Total Bilirubin: 0.7 mg/dL (ref 0.3–1.2)
Total Protein: 8.5 g/dL — ABNORMAL HIGH (ref 6.5–8.1)

## 2022-05-05 LAB — CBC WITH DIFFERENTIAL/PLATELET
Abs Immature Granulocytes: 0.1 10*3/uL — ABNORMAL HIGH (ref 0.00–0.07)
Basophils Absolute: 0.1 10*3/uL (ref 0.0–0.1)
Basophils Relative: 0 %
Eosinophils Absolute: 0.2 10*3/uL (ref 0.0–0.5)
Eosinophils Relative: 1 %
HCT: 34.6 % — ABNORMAL LOW (ref 39.0–52.0)
Hemoglobin: 11.2 g/dL — ABNORMAL LOW (ref 13.0–17.0)
Immature Granulocytes: 1 %
Lymphocytes Relative: 6 %
Lymphs Abs: 0.9 10*3/uL (ref 0.7–4.0)
MCH: 32.2 pg (ref 26.0–34.0)
MCHC: 32.4 g/dL (ref 30.0–36.0)
MCV: 99.4 fL (ref 80.0–100.0)
Monocytes Absolute: 0.7 10*3/uL (ref 0.1–1.0)
Monocytes Relative: 4 %
Neutro Abs: 14.2 10*3/uL — ABNORMAL HIGH (ref 1.7–7.7)
Neutrophils Relative %: 88 %
Platelets: 514 10*3/uL — ABNORMAL HIGH (ref 150–400)
RBC: 3.48 MIL/uL — ABNORMAL LOW (ref 4.22–5.81)
RDW: 16.2 % — ABNORMAL HIGH (ref 11.5–15.5)
WBC: 16.1 10*3/uL — ABNORMAL HIGH (ref 4.0–10.5)
nRBC: 0.2 % (ref 0.0–0.2)

## 2022-05-05 LAB — LIPASE, BLOOD: Lipase: 38 U/L (ref 11–51)

## 2022-05-05 MED ORDER — IOHEXOL 300 MG/ML  SOLN
80.0000 mL | Freq: Once | INTRAMUSCULAR | Status: AC | PRN
  Administered 2022-05-05: 80 mL via INTRAVENOUS

## 2022-05-05 MED ORDER — PANTOPRAZOLE SODIUM 40 MG IV SOLR
40.0000 mg | Freq: Two times a day (BID) | INTRAVENOUS | Status: DC
  Administered 2022-05-06 (×2): 40 mg via INTRAVENOUS
  Filled 2022-05-05 (×2): qty 10

## 2022-05-05 MED ORDER — INSULIN ASPART 100 UNIT/ML IJ SOLN: 0.0000 [IU] | INTRAMUSCULAR | Status: DC

## 2022-05-05 MED ORDER — LACTATED RINGERS IV SOLN: INTRAVENOUS | Status: DC

## 2022-05-05 MED ORDER — ONDANSETRON HCL 4 MG PO TABS: 4.0000 mg | ORAL_TABLET | Freq: Four times a day (QID) | ORAL | Status: DC | PRN

## 2022-05-05 MED ORDER — SODIUM CHLORIDE 0.9 % IV SOLN
3.0000 g | Freq: Once | INTRAVENOUS | Status: AC
  Administered 2022-05-06: 3 g via INTRAVENOUS
  Filled 2022-05-05: qty 8

## 2022-05-05 MED ORDER — ACETAMINOPHEN 650 MG RE SUPP: 650.0000 mg | Freq: Four times a day (QID) | RECTAL | Status: DC | PRN

## 2022-05-05 MED ORDER — LACTATED RINGERS IV BOLUS
1000.0000 mL | Freq: Once | INTRAVENOUS | Status: AC
  Administered 2022-05-05: 1000 mL via INTRAVENOUS

## 2022-05-05 MED ORDER — ACETAMINOPHEN 325 MG PO TABS: 650.0000 mg | ORAL_TABLET | Freq: Four times a day (QID) | ORAL | Status: DC | PRN

## 2022-05-05 MED ORDER — HYDRALAZINE HCL 20 MG/ML IJ SOLN: 5.0000 mg | INTRAMUSCULAR | Status: DC | PRN

## 2022-05-05 MED ORDER — BISACODYL 10 MG RE SUPP: 10.0000 mg | Freq: Every day | RECTAL | Status: DC | PRN

## 2022-05-05 MED ORDER — MORPHINE SULFATE (PF) 2 MG/ML IV SOLN: 2.0000 mg | INTRAVENOUS | Status: DC | PRN

## 2022-05-05 MED ORDER — ONDANSETRON HCL 4 MG/2ML IJ SOLN: 4.0000 mg | Freq: Four times a day (QID) | INTRAMUSCULAR | Status: DC | PRN

## 2022-05-05 NOTE — Assessment & Plan Note (Addendum)
CT abdomen and pelvis showing  aspiration, Patient vomited x 1 earlier Continue Unasyn Keep n.p.o. until evaluated by speech IV hydration Aspiration precautions

## 2022-05-05 NOTE — ED Notes (Signed)
Patient transported to CT 

## 2022-05-05 NOTE — ED Triage Notes (Signed)
Patient states that he has not been able to have a bowel movements in 3 days; Has been taking Miralax and drinking prune juice with no relief and is having rectal pain; Earlier today he vomited black/brown coffee emesis

## 2022-05-05 NOTE — H&P (Incomplete)
History and Physical    Patient: Gregory Tran [id number] DOB: 1935/04/02 DOA: 05/05/2022 DOS: the patient was seen and examined on 05/05/2022 PCP: Sallee Lange, NP  Patient coming from: Home  Chief Complaint:  Chief Complaint  Patient presents with   Constipation    Patient states that he has not been able to have a bowel movements in 3 days; Has been taking Miralax and drinking prune juice with no relief and is having rectal pain; Earlier today he vomited black/brown coffee emesis    HPI: Gregory Tran is a 87 y.o. male with medical history significant for CAD, HTN, DM, chronic anemia followed by oncology, who initially presented to the ED with constipation and rectal pain, unresolved after taking MiraLAX and drinking prune juice.  Clearly on the day of arrival he had a single episode of black/brown coffee-ground emesis.  He denied fever or chills, chest pain or shortness of breath. ED course and data review: Vitals within normal limits.  WBC 16,100, hemoglobin at baseline at 11.2.  Creatinine 1.65 up from baseline of 1.12, lipase and LFTs WNL.  Urinalysis pending.  CT abdomen and pelvis with concern for aspiration pneumonia as follows: IMPRESSION: 1. No acute intra-abdominal or pelvic pathology. No bowel obstruction. Normal appendix. 2. Colonic diverticulosis. 3. Scattered nodular densities throughout the visualized lung bases most consistent with atypical pneumonia versus aspiration.   Patient was disimpacted in the ED.  For aspiration pneumonia he was started on Unasyn given an IV fluid bolus and hospitalist consulted for admission.   Review of Systems: As mentioned in the history of present illness. All other systems reviewed and are negative.  Past Medical History:  Diagnosis Date   Arthritis    BPH (benign prostatic hyperplasia)    Cancer (HCC)    Basal Cell   Chronic kidney disease    Coronary artery disease    Diabetes mellitus (HCC)     Ejaculatory disorder    Erectile dysfunction    Frequency    GERD (gastroesophageal reflux disease)    Gross hematuria    Heart disease    Hematuria    HTN (hypertension)    Hyperlipidemia    Incomplete bladder emptying    Microscopic hematuria    Myocardial infarction (HCC)    Neuropathy    Nocturia    Rotator cuff tear left   TIA (transient ischemic attack)    Tick bite of multiple sites 5 days ago   chest, and groin area   Vertigo    1-2x/yr   Past Surgical History:  Procedure Laterality Date   cardiac stents     CATARACT EXTRACTION W/PHACO Left 08/02/2018   Procedure: CATARACT EXTRACTION PHACO AND INTRAOCULAR LENS PLACEMENT ([place])  LEFT DIABETIC;  Surgeon: Eulogio Bear, MD;  Location: [place];  Service: Ophthalmology;  Laterality: Left;  diabetes - insulin and oral meds   CATARACT EXTRACTION W/PHACO Right 10/11/2018   Procedure: CATARACT EXTRACTION PHACO AND INTRAOCULAR LENS PLACEMENT ([place]) RIGHT DIABETES;  Surgeon: Eulogio Bear, MD;  Location: [place];  Service: Ophthalmology;  Laterality: Right;  Diabetic - insulin and oral meds   CORONARY ANGIOPLASTY  2001, 2008   [facility]   DUPUYTREN CONTRACTURE RELEASE Right 04/30/2016   Procedure: DUPUYTREN CONTRACTURE RELEASE;  Surgeon: Leanor Kail, MD;  Location: [facility];  Service: Orthopedics;  Laterality: Right;   KYPHOPLASTY N/A 01/26/2018   Procedure: El[place]anda [place];  Surgeon: Hessie Knows, MD;  Location: [facility];  Service: Orthopedics;  Laterality:  N/A;   ROTATOR CUFF REPAIR Bilateral    TRANSURETHRAL RESECTION OF PROSTATE N/A 07/09/2015   Procedure: TRANSURETHRAL RESECTION OF THE PROSTATE (TURP);  Surgeon: Hollice Espy, MD;  Location: [facility];  Service: Urology;  Laterality: N/A;   Social History:  reports that he quit smoking about 4 years ago. His smoking use included cigarettes. He has a 50.00 pack-year smoking history. He has never used smokeless tobacco. He reports that he does not  drink alcohol and does not use drugs.  Allergies  Allergen Reactions   Atorvastatin Other (See Comments)   B Complex-Folic Acid Hives    Family History  Problem Relation Age of Onset   Ovarian cancer Sister    Kidney disease Brother        born one kidney   Bladder Cancer Neg Hx    Prostate cancer Neg Hx     Prior to Admission medications   Medication Sig Start Date End Date Taking? Authorizing Provider  amLODipine (NORVASC) 10 MG tablet Take 10 mg by mouth daily.    [provider]  ammonium lactate (AMLACTIN) 12 % cream Apply 1 Application topically 2 (two) times daily. (Apply to back)    [provider]  aspirin EC 81 MG tablet Take 81 mg by mouth daily.     [provider]  gabapentin (NEURONTIN) 100 MG capsule Take 1 capsule by mouth every morning and take 3 capsules by mouth every night at bedtime. 04/05/22   Duard Brady, MD  glipiZIDE (GLUCOTROL XL) 10 MG 24 hr tablet Take 10 mg by mouth daily with breakfast.    [provider]  insulin aspart (NOVOLOG) 100 UNIT/ML FlexPen Inject 22 Units into the skin 2 (two) times daily before a meal.    [provider]  insulin degludec (TRESIBA) 100 UNIT/ML FlexTouch Pen Inject 50 Units into the skin daily.    [provider]  metFORMIN (GLUCOPHAGE) 1000 MG tablet Take 1,000 mg by mouth daily.    [provider]  metoprolol succinate (TOPROL-XL) 50 MG 24 hr tablet Take 25 mg by mouth daily.     [provider]  pantoprazole (PROTONIX) 40 MG tablet Take 40 mg by mouth daily.     [provider]  rosuvastatin (CRESTOR) 5 MG tablet Take 5 mg by mouth every other day.     [provider]  tamsulosin (FLOMAX) 0.4 MG CAPS capsule TAKE 1 CAPSULE(0.4 MG) BY MOUTH DAILY 12/30/16   Hollice Espy, MD    Physical Exam: Vitals:   05/05/22 1801  BP: 138/67  Pulse: 89  Resp: 20  Temp: 97.7 F (36.5 C)  TempSrc: Oral  SpO2: 93%   Physical Exam Vitals  and nursing note reviewed.  Constitutional:      General: He is not in acute distress. HENT:     Head: Normocephalic and atraumatic.  Cardiovascular:     Rate and Rhythm: Normal rate and regular rhythm.     Heart sounds: Normal heart sounds.  Pulmonary:     Effort: Pulmonary effort is normal.     Breath sounds: Normal breath sounds.  Abdominal:     Palpations: Abdomen is soft.     Tenderness: There is no abdominal tenderness.  Neurological:     General: No focal deficit present.     Mental Status: He is lethargic.     Labs on Admission: I have personally reviewed following labs and imaging studies  CBC: Recent Labs  Lab 05/05/22 1804  WBC  16.1*  NEUTROABS 14.2*  HGB 11.2*  HCT 34.6*  MCV 99.4  PLT 0000000*   Basic Metabolic Panel: Recent Labs  Lab 05/05/22 1804  NA 137  K 4.1  CL 102  CO2 21*  GLUCOSE 131*  BUN 23  CREATININE 1.65*  CALCIUM 9.7   GFR: CrCl cannot be calculated (Unknown ideal weight.). Liver Function Tests: Recent Labs  Lab 05/05/22 1804  AST 32  ALT 19  ALKPHOS 68  BILITOT 0.7  PROT 8.5*  ALBUMIN 3.9   Recent Labs  Lab 05/05/22 1804  LIPASE 38   No results for input(s): "AMMONIA" in the last 168 hours. Coagulation Profile: No results for input(s): "INR", "PROTIME" in the last 168 hours. Cardiac Enzymes: No results for input(s): "CKTOTAL", "CKMB", "CKMBINDEX", "TROPONINI" in the last 168 hours. BNP (last 3 results) No results for input(s): "PROBNP" in the last 8760 hours. HbA1C: No results for input(s): "HGBA1C" in the last 72 hours. CBG: No results for input(s): "GLUCAP" in the last 168 hours. Lipid Profile: No results for input(s): "CHOL", "HDL", "LDLCALC", "TRIG", "CHOLHDL", "LDLDIRECT" in the last 72 hours. Thyroid Function Tests: No results for input(s): "TSH", "T4TOTAL", "FREET4", "T3FREE", "THYROIDAB" in the last 72 hours. Anemia Panel: No results for input(s): "VITAMINB12", "FOLATE", "FERRITIN", "TIBC", "IRON",  "RETICCTPCT" in the last 72 hours. Urine analysis:    Component Value Date/Time   COLORURINE YELLOW (A) 04/03/2022 0900   APPEARANCEUR HAZY (A) 04/03/2022 0900   APPEARANCEUR Clear 06/07/2015 0850   LABSPEC 1.024 04/03/2022 0900   PHURINE 5.0 04/03/2022 0900   GLUCOSEU >=500 (A) 04/03/2022 0900   HGBUR LARGE (A) 04/03/2022 0900   BILIRUBINUR NEGATIVE 04/03/2022 0900   BILIRUBINUR Negative 06/07/2015 0850   KETONESUR 20 (A) 04/03/2022 0900   PROTEINUR 100 (A) 04/03/2022 0900   NITRITE NEGATIVE 04/03/2022 0900   LEUKOCYTESUR TRACE (A) 04/03/2022 0900    Radiological Exams on Admission: CT Abdomen Pelvis W Contrast  Result Date: 05/05/2022 CLINICAL DATA:  Concern for bowel obstruction. EXAM: CT ABDOMEN AND PELVIS WITH CONTRAST TECHNIQUE: Multidetector CT imaging of the abdomen and pelvis was performed using the standard protocol following bolus administration of intravenous contrast. RADIATION DOSE REDUCTION: This exam was performed according to the departmental dose-optimization program which includes automated exposure control, adjustment of the mA and/or kV according to patient size and/or use of iterative reconstruction technique. CONTRAST:  77mL OMNIPAQUE IOHEXOL 300 MG/ML  SOLN COMPARISON:  CT abdomen pelvis dated 11/11/2012. FINDINGS: Lower chest: Scattered nodular densities throughout the visualized lung bases most consistent with atypical pneumonia versus aspiration. Clinical correlation is recommended. There is coronary vascular calcification. No intra-abdominal free air or free fluid. Hepatobiliary: The liver is unremarkable. No biliary dilatation. The gallbladder is unremarkable. Pancreas: Scattered calcification of the uncinate process of the pancreas, likely sequela of chronic pancreatitis. No active inflammatory changes. No dilatation of the main pancreatic duct or gland atrophy. Spleen: Normal in size without focal abnormality. Adrenals/Urinary Tract: The adrenal glands  unremarkable. Mild bilateral renal parenchyma atrophy. There is no hydronephrosis on the side. There is symmetric enhancement and excretion of contrast by both kidneys. There is renal vascular calcification. The visualized ureters and delayed bladder appear unremarkable. Stomach/Bowel: There is a small hiatal hernia. There is sigmoid diverticulosis and scattered colonic diverticula without active inflammatory changes. There is moderate stool in the distal colon. There is no bowel obstruction. The appendix is normal. Vascular/Lymphatic: Advanced aortoiliac atherosclerotic disease. The IVC is unremarkable. No portal venous gas. There is no adenopathy. Reproductive:  The prostate and seminal vesicles are grossly unremarkable. No pelvic mass. Other: None Musculoskeletal: Osteopenia with degenerative changes of the spine. L4 and L5 vertebroplasty. No acute osseous pathology. IMPRESSION: 1. No acute intra-abdominal or pelvic pathology. No bowel obstruction. Normal appendix. 2. Colonic diverticulosis. 3. Scattered nodular densities throughout the visualized lung bases most consistent with atypical pneumonia versus aspiration. 4.  Aortic Atherosclerosis (ICD10-I70.0). Electronically Signed   By: Anner Crete M.D.   On: 05/05/2022 21:45     Data Reviewed: Relevant notes from primary care and specialist visits, past discharge summaries as available in EHR, including Care Everywhere. Prior diagnostic testing as pertinent to current admission diagnoses Updated medications and problem lists for reconciliation ED course, including vitals, labs, imaging, treatment and response to treatment Triage notes, nursing and pharmacy notes and ED provider's notes Notable results as noted in HPI   Assessment and Plan: * Coffee ground emesis Patient had an episode of coffee-ground emesis prior to arrival Hemoglobin stable Will keep n.p.o. Serial H&H IV Protonix GI consult    Latest Ref Rng & Units 05/05/2022    6:04 PM  04/05/2022    9:03 AM 04/04/2022    3:03 AM  CBC  WBC 4.0 - 10.5 K/uL 16.1  6.5  11.8   Hemoglobin 13.0 - 17.0 g/dL 11.2  11.2  9.7   Hematocrit 39.0 - 52.0 % 34.6  32.3  28.4   Platelets 150 - 400 K/uL 514  301  259      Aspiration pneumonia (HCC)-resolved as of 05/06/2022 CT abdomen and pelvis showing  aspiration, Patient vomited x 1 earlier Continue Unasyn Keep n.p.o. until evaluated by speech IV hydration Aspiration precautions  Uncontrolled type 2 diabetes mellitus with hypoglycemia, with long-term current use of insulin (HCC) Symptomatic hypoglycemia Addendum at 00:17: Patient noted to be lethargic and blood sugar 33 Amp D50 ordered and will hang  D5 NS and hypoglycemia protocol CBG q 1 hr until stable and then start sliding scale as appropriate  AKI (acute kidney injury) ([place]) Creatinine 1.65, up from baseline of 1.12 IV hydration and monitor  Constipation Fecal impaction Was found to be fecally impacted in the ED and was disimpacted by the ED provider Stool softeners with daily laxatives, once safe to swallow after cleared by GI and if no procedure High-fiber diet  Essential hypertension As needed IV hydralazine while n.p.o.  Chronic anemia History of iron deficiency anemia and vitamin B12 deficiency followed by oncologist, Dr. Tasia Catchings Hemoglobin stable  Coronary artery disease Hold aspirin, metoprolol and rosuvastatin until cleared to swallow and if no GI procedure planned    DVT prophylaxis: SCD  Consults: GI Dr Marius Ditch  Advance Care Planning:   Code Status: Prior   Family Communication: none  Disposition Plan: Back to previous home environment  Severity of Illness: The appropriate patient status for this patient is INPATIENT. Inpatient status is judged to be reasonable and necessary in order to provide the required intensity of service to ensure the patient's safety. The patient's presenting symptoms, physical exam findings, and initial radiographic and  laboratory data in the context of their chronic comorbidities is felt to place them at high risk for further clinical deterioration. Furthermore, it is not anticipated that the patient will be medically stable for discharge from the hospital within 2 midnights of admission.   * I certify that at the point of admission it is my clinical judgment that the patient will require inpatient hospital care spanning beyond 2 midnights from the point  of admission due to high intensity of service, high risk for further deterioration and high frequency of surveillance required.*  Author: Athena Masse, MD 05/05/2022 11:20 PM  For on call review [email or web address].

## 2022-05-05 NOTE — ED Provider Notes (Signed)
[facility] Provider Note    Event Date/Time   First MD Initiated Contact with Patient 05/05/22 2038     (approximate)   History   Chief Complaint Constipation (Patient states that he has not been able to have a bowel movements in 3 days; Has been taking Miralax and drinking prune juice with no relief and is having rectal pain; Earlier today he vomited black/brown coffee emesis)   HPI  ERMA FRIERSON is a 87 y.o. male with past medical history of hypertension, diabetes, CAD, CKD, and GERD who presents to the ED complaining of constipation.  Patient reports that he has not been able to have a bowel movement in the past 3 days despite taking multiple doses of MiraLAX.  Patient reports that he has been dealing with increasing pain in his rectal area as well as his lower abdomen.  He has begun to feel nauseous and had 1 episode of vomiting earlier today.  He describes the emesis as brownish-black, did not notice any blood in the vomit.  He has passed a small amount of stool while in the ED waiting room, but denies any significant bowel movements.  He has not had any difficulty urinating and denies any dysuria or hematuria.     Physical Exam   Triage Vital Signs: ED Triage Vitals [05/05/22 1801]  Enc Vitals Group     BP 138/67     Pulse Rate 89     Resp 20     Temp 97.7 F (36.5 C)     Temp Source Oral     SpO2 93 %     Weight      Height      Head Circumference      Peak Flow      Pain Score      Pain Loc      Pain Edu?      Excl. in [place]?     Most recent vital signs: Vitals:   05/05/22 1801  BP: 138/67  Pulse: 89  Resp: 20  Temp: 97.7 F (36.5 C)  SpO2: 93%    Constitutional: Alert and oriented. Eyes: Conjunctivae are normal. Head: Atraumatic. Nose: No congestion/rhinnorhea. Mouth/Throat: Mucous membranes are moist.  Cardiovascular: Normal rate, regular rhythm. Grossly normal heart sounds.  2+ radial pulses bilaterally. Respiratory:  Normal respiratory effort.  No retractions. Lungs CTAB. Gastrointestinal: Soft and tender to palpation in the bilateral lower quadrants with no rebound or guarding.  Mild distention noted. Musculoskeletal: No lower extremity tenderness nor edema.  Neurologic:  Normal speech and language. No gross focal neurologic deficits are appreciated.    ED Results / Procedures / Treatments   Labs (all labs ordered are listed, but only abnormal results are displayed) Labs Reviewed  COMPREHENSIVE METABOLIC PANEL - Abnormal; Notable for the following components:      Result Value   CO2 21 (*)    Glucose, Bld 131 (*)    Creatinine, Ser 1.65 (*)    Total Protein 8.5 (*)    GFR, Estimated 41 (*)    All other components within normal limits  CBC WITH DIFFERENTIAL/PLATELET - Abnormal; Notable for the following components:   WBC 16.1 (*)    RBC 3.48 (*)    Hemoglobin 11.2 (*)    HCT 34.6 (*)    RDW 16.2 (*)    Platelets 514 (*)    Neutro Abs 14.2 (*)    Abs Immature Granulocytes 0.10 (*)    All  other components within normal limits  LIPASE, BLOOD  URINALYSIS, ROUTINE W REFLEX MICROSCOPIC   RADIOLOGY CT abdomen/pelvis reviewed and interpreted by me with no inflammatory changes, focal fluid collections, or dilated bowel loops.  PROCEDURES:  Critical Care performed: No  Procedures ------------------------------------------------------------------------------------------------------------------- Fecal Disimpaction Procedure Note:  Performed by me:  Patient placed in the lateral recumbent position with knees drawn towards chest. Nurse present for patient support. Large amount of hard brown stool removed. No complications during procedure.   ------------------------------------------------------------------------------------------------------------------    MEDICATIONS ORDERED IN ED: Medications  Ampicillin-Sulbactam (UNASYN) 3 g in sodium chloride 0.9 % 100 mL IVPB (has no administration  in time range)  lactated ringers bolus 1,000 mL (1,000 mLs Intravenous New Bag/Given 05/05/22 2141)  iohexol (OMNIPAQUE) 300 MG/ML solution 80 mL (80 mLs Intravenous Contrast Given 05/05/22 2119)     IMPRESSION / MDM / ASSESSMENT AND PLAN / ED COURSE  I reviewed the triage vital signs and the nursing notes.                              87 y.o. male with past medical history of hypertension, diabetes, CAD, CKD, and GERD who presents to the ED complaining of 3 days of constipation now with increasing rectal and abdominal pain as well as an episode of vomiting earlier today.  Patient's presentation is most consistent with acute presentation with potential threat to life or bodily function.  Differential diagnosis includes, but is not limited to, bowel obstruction, malignancy, constipation, fecal impaction, dehydration, electrode abnormality, AKI.  Patient nontoxic-appearing and in no acute distress, vital signs are unremarkable.  He does have mild distention of his abdomen with tenderness to palpation the bilateral lower quadrants.  With recent constipation we will further assess with CT imaging for bowel obstruction or other process contributing to his constipation and pain.  Labs remarkable for leukocytosis with stable anemia compared to previous.  Patient does have AKI compared to previous, admits to recent decreased oral intake and we will hydrate with IV fluids.  No associated electrolyte abnormality noted, LFTs and lipase are unremarkable.  Patient declines pain or nausea medication, will reassess following imaging.  CT imaging is negative for acute intra-abdominal process, does show evidence of aspiration pneumonia, potentially related to patient's episode of vomiting earlier today.  We will treat with IV Unasyn and he would benefit from admission given AKI, pneumonia, and weakness.  Rectal exam was performed and patient had significant amount of impacted stool, which was manually disimpacted.   Case discussed with hospitalist for admission.      FINAL CLINICAL IMPRESSION(S) / ED DIAGNOSES   Final diagnoses:  AKI (acute kidney injury) ([place])  Fecal impaction ([place])  Aspiration into airway, initial encounter     Rx / DC Orders   ED Discharge Orders     None        Note:  This document was prepared using Dragon voice recognition software and may include unintentional dictation errors.   Blake Divine, MD 05/05/22 2300

## 2022-05-05 NOTE — Assessment & Plan Note (Signed)
As needed IV hydralazine while n.p.o. ?

## 2022-05-05 NOTE — Assessment & Plan Note (Signed)
Creatinine 1.65, up from baseline of 1.12 IV hydration and monitor

## 2022-05-05 NOTE — Assessment & Plan Note (Addendum)
Patient had an episode of coffee-ground emesis prior to arrival Hemoglobin stable Will keep n.p.o. Serial H&H IV Protonix GI consult    Latest Ref Rng & Units 05/05/2022    6:04 PM 04/05/2022    9:03 AM 04/04/2022    3:03 AM  CBC  WBC 4.0 - 10.5 K/uL 16.1  6.5  11.8   Hemoglobin 13.0 - 17.0 g/dL 11.2  11.2  9.7   Hematocrit 39.0 - 52.0 % 34.6  32.3  28.4   Platelets 150 - 400 K/uL 514  301  259

## 2022-05-05 NOTE — Assessment & Plan Note (Signed)
Symptomatic hypoglycemia Addendum at 00:17: Patient noted to be lethargic and blood sugar 33 Amp D50 ordered and will hang  D5 NS and hypoglycemia protocol CBG q 1 hr until stable and then start sliding scale as appropriate

## 2022-05-05 NOTE — Assessment & Plan Note (Addendum)
Hold aspirin, metoprolol and rosuvastatin until cleared to swallow and if no GI procedure planned

## 2022-05-05 NOTE — ED Notes (Signed)
First nurse note-pt brought in via ems from home with constipation for 3 days.  Vomited black emesis x 1 today.  Pt has dizziness.  Cough for 1 month.  Pt taking laxatives without relief.  Pt alert.  Pt in wheelchair in lobby.  Bp 115/68, p 85, oxygen sats 92%, fsbs 115 t-98.7 per ems.

## 2022-05-05 NOTE — ED Notes (Signed)
UA when able 

## 2022-05-05 NOTE — Assessment & Plan Note (Addendum)
Fecal impaction Was found to be fecally impacted in the ED and was disimpacted by the ED provider Stool softeners with daily laxatives, once safe to swallow after cleared by GI and if no procedure High-fiber diet

## 2022-05-05 NOTE — Assessment & Plan Note (Signed)
History of iron deficiency anemia and vitamin B12 deficiency followed by oncologist, Dr. Tasia Catchings Hemoglobin stable

## 2022-05-06 ENCOUNTER — Other Ambulatory Visit: Payer: Self-pay

## 2022-05-06 DIAGNOSIS — K5641 Fecal impaction: Secondary | ICD-10-CM | POA: Diagnosis not present

## 2022-05-06 DIAGNOSIS — K920 Hematemesis: Principal | ICD-10-CM

## 2022-05-06 DIAGNOSIS — J690 Pneumonitis due to inhalation of food and vomit: Secondary | ICD-10-CM | POA: Diagnosis present

## 2022-05-06 LAB — URINALYSIS, ROUTINE W REFLEX MICROSCOPIC
Bilirubin Urine: NEGATIVE
Glucose, UA: 50 mg/dL — AB
Ketones, ur: NEGATIVE mg/dL
Leukocytes,Ua: NEGATIVE
Nitrite: NEGATIVE
Protein, ur: 30 mg/dL — AB
Specific Gravity, Urine: 1.045 — ABNORMAL HIGH (ref 1.005–1.030)
pH: 5 (ref 5.0–8.0)

## 2022-05-06 LAB — HEMOGLOBIN
Hemoglobin: 10 g/dL — ABNORMAL LOW (ref 13.0–17.0)
Hemoglobin: 9.3 g/dL — ABNORMAL LOW (ref 13.0–17.0)

## 2022-05-06 LAB — BASIC METABOLIC PANEL
Anion gap: 12 (ref 5–15)
BUN: 22 mg/dL (ref 8–23)
CO2: 21 mmol/L — ABNORMAL LOW (ref 22–32)
Calcium: 8.3 mg/dL — ABNORMAL LOW (ref 8.9–10.3)
Chloride: 105 mmol/L (ref 98–111)
Creatinine, Ser: 1.69 mg/dL — ABNORMAL HIGH (ref 0.61–1.24)
GFR, Estimated: 40 mL/min — ABNORMAL LOW (ref >60)
Glucose, Bld: 250 mg/dL — ABNORMAL HIGH (ref 70–99)
Potassium: 3.9 mmol/L (ref 3.5–5.1)
Sodium: 138 mmol/L (ref 135–145)

## 2022-05-06 LAB — CBC
HCT: 29.2 % — ABNORMAL LOW (ref 39.0–52.0)
Hemoglobin: 9.4 g/dL — ABNORMAL LOW (ref 13.0–17.0)
MCH: 32.3 pg (ref 26.0–34.0)
MCHC: 32.2 g/dL (ref 30.0–36.0)
MCV: 100.3 fL — ABNORMAL HIGH (ref 80.0–100.0)
Platelets: 317 10*3/uL (ref 150–400)
RBC: 2.91 MIL/uL — ABNORMAL LOW (ref 4.22–5.81)
RDW: 16.5 % — ABNORMAL HIGH (ref 11.5–15.5)
WBC: 13.8 10*3/uL — ABNORMAL HIGH (ref 4.0–10.5)
nRBC: 0 % (ref 0.0–0.2)

## 2022-05-06 LAB — CBG MONITORING, ED
Glucose-Capillary: 149 mg/dL — ABNORMAL HIGH (ref 70–99)
Glucose-Capillary: 161 mg/dL — ABNORMAL HIGH (ref 70–99)
Glucose-Capillary: 33 mg/dL — CL (ref 70–99)
Glucose-Capillary: 409 mg/dL — ABNORMAL HIGH (ref 70–99)
Glucose-Capillary: 53 mg/dL — ABNORMAL LOW (ref 70–99)
Glucose-Capillary: 88 mg/dL (ref 70–99)
Glucose-Capillary: 94 mg/dL (ref 70–99)

## 2022-05-06 MED ORDER — INSULIN DEGLUDEC 100 UNIT/ML ~~LOC~~ SOPN: 30.0000 [IU] | PEN_INJECTOR | Freq: Every day | SUBCUTANEOUS | Status: DC

## 2022-05-06 MED ORDER — TAMSULOSIN HCL 0.4 MG PO CAPS: 0.4000 mg | ORAL_CAPSULE | Freq: Every day | ORAL | Status: DC

## 2022-05-06 MED ORDER — LACTATED RINGERS IV SOLN: INTRAVENOUS | Status: DC

## 2022-05-06 MED ORDER — INSULIN ASPART 100 UNIT/ML FLEXPEN: 15.0000 [IU] | PEN_INJECTOR | Freq: Two times a day (BID) | SUBCUTANEOUS | 3 refills | Status: DC

## 2022-05-06 MED ORDER — METOPROLOL SUCCINATE ER 50 MG PO TB24: 25.0000 mg | ORAL_TABLET | Freq: Every day | ORAL | Status: DC

## 2022-05-06 MED ORDER — SODIUM CHLORIDE 0.9 % IV SOLN: INTRAVENOUS | Status: DC

## 2022-05-06 MED ORDER — ROSUVASTATIN CALCIUM 5 MG PO TABS: 5.0000 mg | ORAL_TABLET | ORAL | Status: DC

## 2022-05-06 MED ORDER — POLYETHYLENE GLYCOL 3350 17 G PO PACK
34.0000 g | PACK | Freq: Every day | ORAL | Status: DC
  Administered 2022-05-06: 34 g via ORAL
  Filled 2022-05-06: qty 2

## 2022-05-06 MED ORDER — DIPHENHYDRAMINE HCL 25 MG PO CAPS
25.0000 mg | ORAL_CAPSULE | Freq: Four times a day (QID) | ORAL | Status: DC | PRN
  Administered 2022-05-06: 25 mg via ORAL
  Filled 2022-05-06: qty 1

## 2022-05-06 MED ORDER — DEXTROSE-NACL 5-0.9 % IV SOLN: INTRAVENOUS | Status: DC

## 2022-05-06 MED ORDER — DEXTROSE 50 % IV SOLN
1.0000 | Freq: Once | INTRAVENOUS | Status: AC
  Administered 2022-05-06: 50 mL via INTRAVENOUS
  Filled 2022-05-06: qty 50

## 2022-05-06 MED ORDER — DEXTROSE-NACL 5-0.45 % IV SOLN: INTRAVENOUS | Status: DC

## 2022-05-06 MED ORDER — DEXTROSE 50 % IV SOLN
50.0000 mL | Freq: Once | INTRAVENOUS | Status: AC
  Administered 2022-05-06: 50 mL via INTRAVENOUS
  Filled 2022-05-06: qty 50

## 2022-05-06 MED ORDER — GABAPENTIN 100 MG PO CAPS: 100.0000 mg | ORAL_CAPSULE | Freq: Every day | ORAL | Status: DC

## 2022-05-06 MED ORDER — AMLODIPINE BESYLATE 5 MG PO TABS: 10.0000 mg | ORAL_TABLET | Freq: Every day | ORAL | Status: DC

## 2022-05-06 MED ORDER — INSULIN ASPART 100 UNIT/ML IJ SOLN: 0.0000 [IU] | Freq: Three times a day (TID) | INTRAMUSCULAR | Status: DC

## 2022-05-06 NOTE — ED Notes (Addendum)
MD [facility] notified of pt blood sugar 53.

## 2022-05-06 NOTE — Discharge Summary (Signed)
Gregory Tran JIR:[id number] DOB: 1936/01/20 DOA: 05/05/2022  PCP: Sallee Lange, NP  Admit date: 05/05/2022 Discharge date: 05/06/2022  Time spent: 35 minutes  Recommendations for Outpatient Follow-up:  Pcp f/u 1 week, check kidney function    Discharge Diagnoses:  Principal Problem:   Coffee ground emesis Active Problems:   BPH with obstruction/lower urinary tract symptoms   Coronary artery disease   Chronic anemia   Constipation   AKI (acute kidney injury) ([place])   Essential hypertension   Uncontrolled type 2 diabetes mellitus with hypoglycemia, with long-term current use of insulin ([place])   Fecal impaction ([place])   Discharge Condition: stable  Diet recommendation: heart healthy  There were no vitals filed for this visit.  History of present illness:  From admission h and p Gregory Tran is a 87 y.o. male with medical history significant for CAD, HTN, DM, chronic anemia followed by oncology, who initially presented to the ED with constipation and rectal pain, unresolved after taking MiraLAX and drinking prune juice.  Clearly on the day of arrival he had a single episode of black/brown coffee-ground emesis.  He denied fever or chills, chest pain or shortness of breath.   Hospital Course:  Patient presents with episode of dark emesis after taking medications to relieve constipation. Here he had no further emesis and he stooled several times, both of which were normal in color. Hgb on arrival 11.2 and repeats in the 9s, baseline appears to be around 10-11. GI saw, thought this doesn't strongly sound like hematemesis and advised monitoring and serial hemoglobins. Of note patient also hypoglycemic with aki and cr 1.6 from baseline of around 1.1. The patient thinks he has become confused with his insulin. His son recently came home to stay with him and plans to be more involved in medication administration. We treated with D5 and hypoglycemia resolved. We advised he stay  overnight for hypoglycemia and bleeding monitoring, but patient/family strongly desire discharge. Advised close monitoring for signs/symptoms of bleeding and also frequent glucose checks for the next 24 hours. If no hypoglycemia ok to resume home diabetes medications though advised holding glipizide and have reduced doses of his long and short-acting insulin. Advise close f/u with PCP who manages the patient's diabetes. Also advised ensure adequate hydration and will need check of kidney function at PCP f/u  Procedures: none   Consultations: GI  Discharge Exam: Vitals:   05/06/22 1530 05/06/22 1600  BP: (!) 150/49 (!) 136/54  Pulse:    Resp: 18   Temp:    SpO2:      General: NAD Cardiovascular: RRR Respiratory: CTAB  Discharge Instructions   Discharge Instructions     Diet - low sodium heart healthy   Complete by: As directed    Increase activity slowly   Complete by: As directed       Allergies as of 05/06/2022       Reactions   Atorvastatin Other (See Comments)   B Complex-folic Acid Hives        Medication List     STOP taking these medications    glipiZIDE 10 MG 24 hr tablet Commonly known as: GLUCOTROL XL       TAKE these medications    amLODipine 10 MG tablet Commonly known as: NORVASC Take 10 mg by mouth daily.   ammonium lactate 12 % cream Commonly known as: AMLACTIN Apply 1 Application topically 2 (two) times daily. (Apply to back)   aspirin EC 81 MG tablet Take  81 mg by mouth daily.   ciprofloxacin 500 MG tablet Commonly known as: CIPRO Take 500 mg by mouth 2 (two) times daily.   gabapentin 100 MG capsule Commonly known as: NEURONTIN Take 100 mg by mouth daily.   insulin aspart 100 UNIT/ML FlexPen Commonly known as: NOVOLOG Inject 15 Units into the skin 2 (two) times daily before a meal. What changed: how much to take   insulin degludec 100 UNIT/ML FlexTouch Pen Commonly known as: TRESIBA Inject 30 Units into the skin  daily. What changed: how much to take   metFORMIN 1000 MG tablet Commonly known as: GLUCOPHAGE Take 1,000 mg by mouth daily.   metoprolol succinate 25 MG 24 hr tablet Commonly known as: TOPROL-XL Take 25 mg by mouth daily.   pantoprazole 40 MG tablet Commonly known as: PROTONIX Take 40 mg by mouth daily.   rosuvastatin 5 MG tablet Commonly known as: CRESTOR Take 5 mg by mouth every other day.   tamsulosin 0.4 MG Caps capsule Commonly known as: FLOMAX TAKE 1 CAPSULE(0.4 MG) BY MOUTH DAILY       Allergies  Allergen Reactions   Atorvastatin Other (See Comments)   B Complex-Folic Acid Hives    Follow-up Information     Gauger, Victoriano Lain, NP Follow up.   Specialty: Internal Medicine Why: 1 week Contact information: [place] [address] [phone]                  The results of significant diagnostics from this hospitalization (including imaging, microbiology, ancillary and laboratory) are listed below for reference.    Significant Diagnostic Studies: CT Abdomen Pelvis W Contrast  Result Date: 05/05/2022 CLINICAL DATA:  Concern for bowel obstruction. EXAM: CT ABDOMEN AND PELVIS WITH CONTRAST TECHNIQUE: Multidetector CT imaging of the abdomen and pelvis was performed using the standard protocol following bolus administration of intravenous contrast. RADIATION DOSE REDUCTION: This exam was performed according to the departmental dose-optimization program which includes automated exposure control, adjustment of the mA and/or kV according to patient size and/or use of iterative reconstruction technique. CONTRAST:  21mL OMNIPAQUE IOHEXOL 300 MG/ML  SOLN COMPARISON:  CT abdomen pelvis dated 11/11/2012. FINDINGS: Lower chest: Scattered nodular densities throughout the visualized lung bases most consistent with atypical pneumonia versus aspiration. Clinical correlation is recommended. There is coronary vascular  calcification. No intra-abdominal free air or free fluid. Hepatobiliary: The liver is unremarkable. No biliary dilatation. The gallbladder is unremarkable. Pancreas: Scattered calcification of the uncinate process of the pancreas, likely sequela of chronic pancreatitis. No active inflammatory changes. No dilatation of the main pancreatic duct or gland atrophy. Spleen: Normal in size without focal abnormality. Adrenals/Urinary Tract: The adrenal glands unremarkable. Mild bilateral renal parenchyma atrophy. There is no hydronephrosis on the side. There is symmetric enhancement and excretion of contrast by both kidneys. There is renal vascular calcification. The visualized ureters and delayed bladder appear unremarkable. Stomach/Bowel: There is a small hiatal hernia. There is sigmoid diverticulosis and scattered colonic diverticula without active inflammatory changes. There is moderate stool in the distal colon. There is no bowel obstruction. The appendix is normal. Vascular/Lymphatic: Advanced aortoiliac atherosclerotic disease. The IVC is unremarkable. No portal venous gas. There is no adenopathy. Reproductive: The prostate and seminal vesicles are grossly unremarkable. No pelvic mass. Other: None Musculoskeletal: Osteopenia with degenerative changes of the spine. L4 and L5 vertebroplasty. No acute osseous pathology. IMPRESSION: 1. No acute intra-abdominal or pelvic pathology. No bowel obstruction. Normal  appendix. 2. Colonic diverticulosis. 3. Scattered nodular densities throughout the visualized lung bases most consistent with atypical pneumonia versus aspiration. 4.  Aortic Atherosclerosis (ICD10-I70.0). Electronically Signed   By: Anner Crete M.D.   On: 05/05/2022 21:45    Microbiology: No results found for this or any previous visit (from the past 240 hour(s)).   Labs: Basic Metabolic Panel: Recent Labs  Lab 05/05/22 1804 05/06/22 1451  NA 137 138  K 4.1 3.9  CL 102 105  CO2 21* 21*  GLUCOSE  131* 250*  BUN 23 22  CREATININE 1.65* 1.69*  CALCIUM 9.7 8.3*   Liver Function Tests: Recent Labs  Lab 05/05/22 1804  AST 32  ALT 19  ALKPHOS 68  BILITOT 0.7  PROT 8.5*  ALBUMIN 3.9   Recent Labs  Lab 05/05/22 1804  LIPASE 38   No results for input(s): "AMMONIA" in the last 168 hours. CBC: Recent Labs  Lab 05/05/22 1804 05/06/22 0110 05/06/22 0444 05/06/22 1451  WBC 16.1*  --   --  13.8*  NEUTROABS 14.2*  --   --   --   HGB 11.2* 10.0* 9.3* 9.4*  HCT 34.6*  --   --  29.2*  MCV 99.4  --   --  100.3*  PLT 514*  --   --  317   Cardiac Enzymes: No results for input(s): "CKTOTAL", "CKMB", "CKMBINDEX", "TROPONINI" in the last 168 hours. BNP: BNP (last 3 results) No results for input(s): "BNP" in the last 8760 hours.  ProBNP (last 3 results) No results for input(s): "PROBNP" in the last 8760 hours.  CBG: Recent Labs  Lab 05/06/22 0305 05/06/22 0445 05/06/22 0755 05/06/22 0944 05/06/22 1228  GLUCAP 409* 88 53* 94 161*       Signed:  Desma Maxim MD.  [facility] 05/06/2022, 4:26 PM

## 2022-05-06 NOTE — Care Management CC44 (Signed)
Condition Code 44 Documentation Completed  Patient Details  Name: BECKUM PETRICK MRN: [id number] Date of Birth: [birth year]   Condition Code 44 given:  Yes Patient signature on Condition Code 44 notice:  Yes Documentation of 2 MD's agreement:  Yes Code 44 added to claim:  Yes    Ross Ludwig, LCSW 05/06/2022, 4:42 PM

## 2022-05-06 NOTE — ED Notes (Signed)
Per Dr Si Raider, pt will be discharged and it is okay to discontinue infusion and allow patient to get dressed.

## 2022-05-06 NOTE — ED Notes (Signed)
Pt had BM in bed that was formed stool. Pt cleaned with bath wipes. New sheets and absorbent pad placed on bed.

## 2022-05-06 NOTE — Progress Notes (Signed)
PROGRESS NOTE    Gregory Tran  [id number] DOB: 01-03-1936 DOA: 05/05/2022 PCP: Sallee Lange, NP     Brief Narrative:   From admission h and p  Gregory Tran is a 87 y.o. male with medical history significant for CAD, HTN, DM, chronic anemia followed by oncology, who initially presented to the ED with constipation and rectal pain, unresolved after taking MiraLAX and drinking prune juice.  Clearly on the day of arrival he had a single episode of black/brown coffee-ground emesis.  He denied fever or chills, chest pain or shortness of breath.    Assessment & Plan:   Principal Problem:   Aspiration pneumonia ([place]) Active Problems:   Coffee ground emesis   Uncontrolled type 2 diabetes mellitus with hypoglycemia, with long-term current use of insulin ([facility])   Constipation   AKI (acute kidney injury) ([place])   BPH with obstruction/lower urinary tract symptoms   Coronary artery disease   Chronic anemia   Essential hypertension  # Emesis With concern for coffee-ground appearance. Has not had further episode. This in the setting of constipation and taking multiple laxitives and prune juice. Hgb 11.2>9.3, baseline is 10-11. No melena or hematochezia. On asa at home. Ct a/p nothing acute - gi consulted, advises monitoring for now, not clear if this was true hematemesis - continue IV PPI - trend hgb  # AKI  Cr 1.65 from baseline of around 1, likely prrenal - IVF, monitor  # Aspiration? CT shows nodular densities in lung bases. WBC elevated but no fever and no respiratory symptoms to suggest aspiration pneumonia. Received a dose of unasyn. Passed bedside swallow eval. - will hold on further abx and monitor symptoms  # Constipation 3 days symptoms. Disimpacted in ED - miralax, senna  # Hypoglycemia # T2DM Glucose 30s and 50s overnight. Has aki and is on glipizide and insulin at home - home long-acting insulin and glipizide are on hold, would d/c glipizide at  d/c - monitor - start d5 infusion  # HTN Bp mildly elevated - resum ehome amlodipine, metoprolol  # CAD Asymptomatic - holding home asa while monitoring for GI bleed - cont home statin, metop  # BPH - cont home flomax     DVT prophylaxis: SCDs Code Status: full Family Communication: son updated @ bedside  Level of care: Progressive Status is: Inpatient   Consultants:  GI  Procedures: none  Antimicrobials:  none    Subjective: No pain, no emesis, thinks had BM earlier today  Objective: Vitals:   05/06/22 0446 05/06/22 0700 05/06/22 0900 05/06/22 0930  BP:  125/63 (!) 143/67   Pulse:  83 93   Resp:  17 (!) 21   Temp: 98.3 F (36.8 C)   98.2 F (36.8 C)  TempSrc: Oral     SpO2:  95% 94%    No intake or output data in the 24 hours ending 05/06/22 0954 There were no vitals filed for this visit.  Examination:  General exam: Appears calm and comfortable  Respiratory system: faint rales at bases otherwise clear Cardiovascular system: S1 & S2 heard, RRR. No JVD, murmurs, rubs, gallops or clicks. No pedal edema. Gastrointestinal system: Abdomen is nondistended, soft and nontender. No organomegaly or masses felt. Normal bowel sounds heard. Central nervous system: Alert and oriented. No focal neurological deficits. Extremities: Symmetric 5 x 5 power. Skin: erythema on back Psychiatry: calm, a bit confused    Data Reviewed: I have personally reviewed following labs and imaging studies  CBC: Recent Labs  Lab 05/05/22 1804 05/06/22 0110 05/06/22 0444  WBC 16.1*  --   --   NEUTROABS 14.2*  --   --   HGB 11.2* 10.0* 9.3*  HCT 34.6*  --   --   MCV 99.4  --   --   PLT 514*  --   --    Basic Metabolic Panel: Recent Labs  Lab 05/05/22 1804  NA 137  K 4.1  CL 102  CO2 21*  GLUCOSE 131*  BUN 23  CREATININE 1.65*  CALCIUM 9.7   GFR: CrCl cannot be calculated (Unknown ideal weight.). Liver Function Tests: Recent Labs  Lab 05/05/22 1804  AST  32  ALT 19  ALKPHOS 68  BILITOT 0.7  PROT 8.5*  ALBUMIN 3.9   Recent Labs  Lab 05/05/22 1804  LIPASE 38   No results for input(s): "AMMONIA" in the last 168 hours. Coagulation Profile: No results for input(s): "INR", "PROTIME" in the last 168 hours. Cardiac Enzymes: No results for input(s): "CKTOTAL", "CKMB", "CKMBINDEX", "TROPONINI" in the last 168 hours. BNP (last 3 results) No results for input(s): "PROBNP" in the last 8760 hours. HbA1C: No results for input(s): "HGBA1C" in the last 72 hours. CBG: Recent Labs  Lab 05/06/22 0109 05/06/22 0305 05/06/22 0445 05/06/22 0755 05/06/22 0944  GLUCAP 149* 409* 88 53* 94   Lipid Profile: No results for input(s): "CHOL", "HDL", "LDLCALC", "TRIG", "CHOLHDL", "LDLDIRECT" in the last 72 hours. Thyroid Function Tests: No results for input(s): "TSH", "T4TOTAL", "FREET4", "T3FREE", "THYROIDAB" in the last 72 hours. Anemia Panel: No results for input(s): "VITAMINB12", "FOLATE", "FERRITIN", "TIBC", "IRON", "RETICCTPCT" in the last 72 hours. Urine analysis:    Component Value Date/Time   COLORURINE YELLOW (A) 05/06/2022 0308   APPEARANCEUR HAZY (A) 05/06/2022 0308   APPEARANCEUR Clear 06/07/2015 0850   LABSPEC 1.045 (H) 05/06/2022 0308   PHURINE 5.0 05/06/2022 0308   GLUCOSEU 50 (A) 05/06/2022 0308   HGBUR SMALL (A) 05/06/2022 0308   BILIRUBINUR NEGATIVE 05/06/2022 0308   BILIRUBINUR Negative 06/07/2015 0850   KETONESUR NEGATIVE 05/06/2022 0308   PROTEINUR 30 (A) 05/06/2022 0308   NITRITE NEGATIVE 05/06/2022 0308   LEUKOCYTESUR NEGATIVE 05/06/2022 0308   Sepsis Labs: @LABRCNTIP (procalcitonin:4,lacticidven:4)  )No results found for this or any previous visit (from the past 240 hour(s)).       Radiology Studies: CT Abdomen Pelvis W Contrast  Result Date: 05/05/2022 CLINICAL DATA:  Concern for bowel obstruction. EXAM: CT ABDOMEN AND PELVIS WITH CONTRAST TECHNIQUE: Multidetector CT imaging of the abdomen and pelvis was  performed using the standard protocol following bolus administration of intravenous contrast. RADIATION DOSE REDUCTION: This exam was performed according to the departmental dose-optimization program which includes automated exposure control, adjustment of the mA and/or kV according to patient size and/or use of iterative reconstruction technique. CONTRAST:  63mL OMNIPAQUE IOHEXOL 300 MG/ML  SOLN COMPARISON:  CT abdomen pelvis dated 11/11/2012. FINDINGS: Lower chest: Scattered nodular densities throughout the visualized lung bases most consistent with atypical pneumonia versus aspiration. Clinical correlation is recommended. There is coronary vascular calcification. No intra-abdominal free air or free fluid. Hepatobiliary: The liver is unremarkable. No biliary dilatation. The gallbladder is unremarkable. Pancreas: Scattered calcification of the uncinate process of the pancreas, likely sequela of chronic pancreatitis. No active inflammatory changes. No dilatation of the main pancreatic duct or gland atrophy. Spleen: Normal in size without focal abnormality. Adrenals/Urinary Tract: The adrenal glands unremarkable. Mild bilateral renal parenchyma atrophy. There is no hydronephrosis on the side.  There is symmetric enhancement and excretion of contrast by both kidneys. There is renal vascular calcification. The visualized ureters and delayed bladder appear unremarkable. Stomach/Bowel: There is a small hiatal hernia. There is sigmoid diverticulosis and scattered colonic diverticula without active inflammatory changes. There is moderate stool in the distal colon. There is no bowel obstruction. The appendix is normal. Vascular/Lymphatic: Advanced aortoiliac atherosclerotic disease. The IVC is unremarkable. No portal venous gas. There is no adenopathy. Reproductive: The prostate and seminal vesicles are grossly unremarkable. No pelvic mass. Other: None Musculoskeletal: Osteopenia with degenerative changes of the spine. L4  and L5 vertebroplasty. No acute osseous pathology. IMPRESSION: 1. No acute intra-abdominal or pelvic pathology. No bowel obstruction. Normal appendix. 2. Colonic diverticulosis. 3. Scattered nodular densities throughout the visualized lung bases most consistent with atypical pneumonia versus aspiration. 4.  Aortic Atherosclerosis (ICD10-I70.0). Electronically Signed   By: Anner Crete M.D.   On: 05/05/2022 21:45        Scheduled Meds:  insulin aspart  0-15 Units Subcutaneous Q4H   pantoprazole (PROTONIX) IV  40 mg Intravenous Q12H   polyethylene glycol  34 g Oral Daily   Continuous Infusions:  dextrose 5 % and 0.45% NaCl       LOS: 1 day     Desma Maxim, MD [facility]   If 7PM-7AM, please contact night-coverage [email or web address] Password [facility] 05/06/2022, 9:54 AM

## 2022-05-06 NOTE — Consult Note (Signed)
Gregory Tran , MD [address], [place], [place], [place], [address] [address], [place], [place], [place], [address] Phone: [phone]  Fax: [phone]  Consultation  Referring Provider:     Dr Damita Dunnings Primary Care Physician:  Sallee Lange, NP Primary Gastroenterologist:  None          Reason for Consultation:     Coffee ground emesis  Date of Admission:  05/05/2022 Date of Consultation:  05/06/2022         HPI:   Gregory Tran is a 87 y.o. male present to the emergency room after being unable to have bowel movement for 3 days had been taking MiraLAX and drinking prune juice with no relief and then he vomited with black-brown coffee-ground emesis.. On admission hemoglobin was 11.2 g What it was exactly a month back.  He is being treated for an aspiration pneumonia after vomiting.  Treated for fecal impaction was found to have AKI in addition.  This morning hemoglobin is 9.3 g no BMP this morning.  No elevation in BUN/creatinine ratio.  CT abdomen pelvis with contrast shows colonic diverticulosis scattered nodular densities throughout the visualized lung bases consistent with atypical pneumonia versus aspiration.   I went in to 2 the emergency room to see the patient who had a son by his side and he said that he was given 3 glasses of warm prune juice by his son to drink to have a bowel movement and following which he had the dark emesis.  To the right after drinking the prune juice.  Denies having any blood or dark stools.  When I went in to see him he said he had had a bowel movement and I looked at the stools it was dark brown in color.  Denies any difficulty in breathing. Past Medical History:  Diagnosis Date   Arthritis    BPH (benign prostatic hyperplasia)    Cancer (HCC)    Basal Cell   Chronic kidney disease    Coronary artery disease    Diabetes mellitus (HCC)    Ejaculatory disorder    Erectile dysfunction    Frequency    GERD (gastroesophageal  reflux disease)    Gross hematuria    Heart disease    Hematuria    HTN (hypertension)    Hyperlipidemia    Incomplete bladder emptying    Microscopic hematuria    Myocardial infarction (HCC)    Neuropathy    Nocturia    Rotator cuff tear left   TIA (transient ischemic attack)    Tick bite of multiple sites 5 days ago   chest, and groin area   Vertigo    1-2x/yr    Past Surgical History:  Procedure Laterality Date   cardiac stents     CATARACT EXTRACTION W/PHACO Left 08/02/2018   Procedure: CATARACT EXTRACTION PHACO AND INTRAOCULAR LENS PLACEMENT ([place])  LEFT DIABETIC;  Surgeon: Eulogio Bear, MD;  Location: [facility];  Service: Ophthalmology;  Laterality: Left;  diabetes - insulin and oral meds   CATARACT EXTRACTION W/PHACO Right 10/11/2018   Procedure: CATARACT EXTRACTION PHACO AND INTRAOCULAR LENS PLACEMENT ([place]) RIGHT DIABETES;  Surgeon: Eulogio Bear, MD;  Location: [facility];  Service: Ophthalmology;  Laterality: Right;  Diabetic - insulin and oral meds   CORONARY ANGIOPLASTY  2001, 2008   [facility]   DUPUYTREN CONTRACTURE RELEASE Right 04/30/2016   Procedure: DUPUYTREN CONTRACTURE RELEASE;  Surgeon: Leanor Kail, MD;  Location: [facility];  Service: Orthopedics;  Laterality: Right;   KYPHOPLASTY N/A 01/26/2018   Procedure: Elwanda [place];  Surgeon: Hessie Knows, MD;  Location: [facility];  Service: Orthopedics;  Laterality: N/A;   ROTATOR CUFF REPAIR Bilateral    TRANSURETHRAL RESECTION OF PROSTATE N/A 07/09/2015   Procedure: TRANSURETHRAL RESECTION OF THE PROSTATE (TURP);  Surgeon: Hollice Espy, MD;  Location: [facility];  Service: Urology;  Laterality: N/A;    Prior to Admission medications   Medication Sig Start Date End Date Taking? Authorizing Provider  amLODipine (NORVASC) 10 MG tablet Take 10 mg by mouth daily.   Yes [provider]  ammonium lactate (AMLACTIN) 12 % cream Apply 1 Application topically 2 (two) times daily. (Apply to  back)   Yes [provider]  ciprofloxacin (CIPRO) 500 MG tablet Take 500 mg by mouth 2 (two) times daily. 04/28/22 05/08/22 Yes [provider]  gabapentin (NEURONTIN) 100 MG capsule Take 100 mg by mouth daily. 04/14/22  Yes [provider]  glipiZIDE (GLUCOTROL XL) 10 MG 24 hr tablet Take 10 mg by mouth daily with breakfast.   Yes [provider]  insulin degludec (TRESIBA) 100 UNIT/ML FlexTouch Pen Inject 50 Units into the skin daily.   Yes [provider]  metFORMIN (GLUCOPHAGE) 1000 MG tablet Take 1,000 mg by mouth daily.   Yes [provider]  metoprolol succinate (TOPROL-XL) 25 MG 24 hr tablet Take 25 mg by mouth daily. 04/14/22  Yes [provider]  pantoprazole (PROTONIX) 40 MG tablet Take 40 mg by mouth daily.    Yes [provider]  rosuvastatin (CRESTOR) 5 MG tablet Take 5 mg by mouth every other day.    Yes [provider]  tamsulosin (FLOMAX) 0.4 MG CAPS capsule TAKE 1 CAPSULE(0.4 MG) BY MOUTH DAILY 12/30/16  Yes Hollice Espy, MD  aspirin EC 81 MG tablet Take 81 mg by mouth daily.     [provider]  insulin aspart (NOVOLOG) 100 UNIT/ML FlexPen Inject 22 Units into the skin 2 (two) times daily before a meal. Patient not taking: Reported on 05/06/2022    [provider]    Family History  Problem Relation Age of Onset   Ovarian cancer Sister    Kidney disease Brother        born one kidney   Bladder Cancer Neg Hx    Prostate cancer Neg Hx      Social History   Tobacco Use   Smoking status: Former    Packs/day: 1.00    Years: 50.00    Additional pack years: 0.00    Total pack years: 50.00    Types: Cigarettes    Quit date: 06/26/2017    Years since quitting: 4.8   Smokeless tobacco: Never  Vaping Use   Vaping Use: Never used  Substance Use Topics   Alcohol use: No    Alcohol/week: 0.0 standard drinks of alcohol   Drug use: No    Allergies as of 05/05/2022 - Review  Complete 05/05/2022  Allergen Reaction Noted   Atorvastatin Other (See Comments) 61/44/3154   B complex-folic acid Hives 00/86/7619    Review of Systems:    All systems reviewed and negative except where noted in HPI.   Physical Exam:  Vital signs in last 24 hours: Temp:  [97.7 F (36.5 C)-98.3 F (36.8 C)] 98.3 F (36.8 C) (03/19 0446) Pulse Rate:  [83-102] 83 (03/19 0700) Resp:  [16-28] 17 (03/19 0700) BP: (125-149)/(63-112) 125/63 (03/19 0700) SpO2:  [93 %-98 %]  95 % (03/19 0700)   General:   Pleasant, cooperative in NAD Head:  Normocephalic and atraumatic. Eyes:   No icterus.   Conjunctiva pink. PERRLA. Ears:  Normal auditory acuity. Neck:  Supple; no masses or thyroidomegaly Lungs: Respirations even and unlabored. Lungs clear to auscultation bilaterally.   No wheezes, crackles, or rhonchi.  Heart:  Regular rate and rhythm;  Without murmur, clicks, rubs or gallops Abdomen:  Soft, nondistended, nontender. Normal bowel sounds. No appreciable masses or hepatomegaly.  No rebound or guarding.  Neurologic:  Alert and oriented x3;  grossly normal neurologically. Skin:  Intact without significant lesions or rashes. Cervical Nodes:  No significant cervical adenopathy. Psych:  Alert and cooperative. Normal affect.  LAB RESULTS: Recent Labs    05/05/22 1804 05/06/22 0110 05/06/22 0444  WBC 16.1*  --   --   HGB 11.2* 10.0* 9.3*  HCT 34.6*  --   --   PLT 514*  --   --    BMET Recent Labs    05/05/22 1804  NA 137  K 4.1  CL 102  CO2 21*  GLUCOSE 131*  BUN 23  CREATININE 1.65*  CALCIUM 9.7   LFT Recent Labs    05/05/22 1804  PROT 8.5*  ALBUMIN 3.9  AST 32  ALT 19  ALKPHOS 68  BILITOT 0.7   PT/INR No results for input(s): "LABPROT", "INR" in the last 72 hours.  STUDIES: CT Abdomen Pelvis W Contrast  Result Date: 05/05/2022 CLINICAL DATA:  Concern for bowel obstruction. EXAM: CT ABDOMEN AND PELVIS WITH CONTRAST TECHNIQUE: Multidetector CT imaging of the  abdomen and pelvis was performed using the standard protocol following bolus administration of intravenous contrast. RADIATION DOSE REDUCTION: This exam was performed according to the departmental dose-optimization program which includes automated exposure control, adjustment of the mA and/or kV according to patient size and/or use of iterative reconstruction technique. CONTRAST:  65mL OMNIPAQUE IOHEXOL 300 MG/ML  SOLN COMPARISON:  CT abdomen pelvis dated 11/11/2012. FINDINGS: Lower chest: Scattered nodular densities throughout the visualized lung bases most consistent with atypical pneumonia versus aspiration. Clinical correlation is recommended. There is coronary vascular calcification. No intra-abdominal free air or free fluid. Hepatobiliary: The liver is unremarkable. No biliary dilatation. The gallbladder is unremarkable. Pancreas: Scattered calcification of the uncinate process of the pancreas, likely sequela of chronic pancreatitis. No active inflammatory changes. No dilatation of the main pancreatic duct or gland atrophy. Spleen: Normal in size without focal abnormality. Adrenals/Urinary Tract: The adrenal glands unremarkable. Mild bilateral renal parenchyma atrophy. There is no hydronephrosis on the side. There is symmetric enhancement and excretion of contrast by both kidneys. There is renal vascular calcification. The visualized ureters and delayed bladder appear unremarkable. Stomach/Bowel: There is a small hiatal hernia. There is sigmoid diverticulosis and scattered colonic diverticula without active inflammatory changes. There is moderate stool in the distal colon. There is no bowel obstruction. The appendix is normal. Vascular/Lymphatic: Advanced aortoiliac atherosclerotic disease. The IVC is unremarkable. No portal venous gas. There is no adenopathy. Reproductive: The prostate and seminal vesicles are grossly unremarkable. No pelvic mass. Other: None Musculoskeletal: Osteopenia with degenerative  changes of the spine. L4 and L5 vertebroplasty. No acute osseous pathology. IMPRESSION: 1. No acute intra-abdominal or pelvic pathology. No bowel obstruction. Normal appendix. 2. Colonic diverticulosis. 3. Scattered nodular densities throughout the visualized lung bases most consistent with atypical pneumonia versus aspiration. 4.  Aortic Atherosclerosis (ICD10-I70.0). Electronically Signed   By: Anner Crete M.D.   On: 05/05/2022 21:45  Impression / Plan:   Gregory Tran is a 87 y.o. y/o male the emergency room with nausea vomiting and able to have a bowel movement for 3 days fecal impaction and there are some concern of coffee-ground emesis.  On admission hemoglobin was at baseline what it was a month back no elevation in BUN/creatinine ratio no melena reported this makes it less likely a GI bleed.  It may be that he threw up the prune juice and prunes that he has been eating that he describes as dark vomitus.    I advised that we can watch his hemoglobin and see how it trends if there is concern for a drop that we can perform an endoscopy tomorrow he is really hungry and wants to eat but I suggested we can start him on drinking liquids at this point of time.   In the interim continue IV PPI keep the head end of the bed elevated and can commence on MiraLAX 2-3 times a day to help clear the colon of stool Thank you for involving me in the care of this patient.      LOS: 1 day   Gregory Bellows, MD  05/06/2022, 8:35 AM

## 2022-05-06 NOTE — ED Notes (Signed)
Pt had small BM in bed on chux pad. Perineal care performed and chux pad changed.

## 2022-05-06 NOTE — Care Management Obs Status (Signed)
[facility] NOTIFICATION   Patient Details  Name: MARSH BEDOY MRN: [id number] Date of Birth: 1935-02-27   Medicare Observation Status Notification Given:  Yes    Cecil Cobbs 05/06/2022, 4:42 PM

## 2022-05-06 NOTE — ED Notes (Signed)
Hospitalist at bedside 

## 2022-05-06 NOTE — ED Notes (Signed)
Pt getting restless in bed. Pt states he is uncomfortable even after several attempts to move patient in bed and just wants to sleep. Pt family at bedside asking if there is just anything we can give pt to help him get more comfortable and rest. RN notified MD Si Raider.

## 2022-05-21 ENCOUNTER — Other Ambulatory Visit: Payer: Self-pay

## 2022-05-21 ENCOUNTER — Inpatient Hospital Stay
Admission: EM | Admit: 2022-05-21 | Discharge: 2022-05-25 | DRG: 871 | Disposition: A | Discharge status: Home Health | Payer: Medicare Other | Attending: Internal Medicine | Admitting: Internal Medicine

## 2022-05-21 ENCOUNTER — Emergency Department: Payer: Medicare Other

## 2022-05-21 ENCOUNTER — Encounter: Payer: Self-pay | Admitting: Hematology and Oncology

## 2022-05-21 DIAGNOSIS — I252 Old myocardial infarction: Secondary | ICD-10-CM

## 2022-05-21 DIAGNOSIS — Z8041 Family history of malignant neoplasm of ovary: Secondary | ICD-10-CM | POA: Diagnosis not present

## 2022-05-21 DIAGNOSIS — Z87891 Personal history of nicotine dependence: Secondary | ICD-10-CM

## 2022-05-21 DIAGNOSIS — Z85828 Personal history of other malignant neoplasm of skin: Secondary | ICD-10-CM | POA: Diagnosis not present

## 2022-05-21 DIAGNOSIS — A419 Sepsis, unspecified organism: Secondary | ICD-10-CM | POA: Diagnosis not present

## 2022-05-21 DIAGNOSIS — Z1152 Encounter for screening for COVID-19: Secondary | ICD-10-CM

## 2022-05-21 DIAGNOSIS — D649 Anemia, unspecified: Secondary | ICD-10-CM | POA: Diagnosis present

## 2022-05-21 DIAGNOSIS — Z955 Presence of coronary angioplasty implant and graft: Secondary | ICD-10-CM | POA: Diagnosis not present

## 2022-05-21 DIAGNOSIS — E1142 Type 2 diabetes mellitus with diabetic polyneuropathy: Secondary | ICD-10-CM | POA: Diagnosis present

## 2022-05-21 DIAGNOSIS — J690 Pneumonitis due to inhalation of food and vomit: Secondary | ICD-10-CM | POA: Diagnosis present

## 2022-05-21 DIAGNOSIS — K219 Gastro-esophageal reflux disease without esophagitis: Secondary | ICD-10-CM | POA: Diagnosis present

## 2022-05-21 DIAGNOSIS — R4182 Altered mental status, unspecified: Secondary | ICD-10-CM | POA: Diagnosis not present

## 2022-05-21 DIAGNOSIS — I10 Essential (primary) hypertension: Secondary | ICD-10-CM | POA: Diagnosis not present

## 2022-05-21 DIAGNOSIS — J189 Pneumonia, unspecified organism: Secondary | ICD-10-CM | POA: Diagnosis present

## 2022-05-21 DIAGNOSIS — Z888 Allergy status to other drugs, medicaments and biological substances status: Secondary | ICD-10-CM

## 2022-05-21 DIAGNOSIS — I2510 Atherosclerotic heart disease of native coronary artery without angina pectoris: Secondary | ICD-10-CM | POA: Diagnosis present

## 2022-05-21 DIAGNOSIS — Z8673 Personal history of transient ischemic attack (TIA), and cerebral infarction without residual deficits: Secondary | ICD-10-CM | POA: Diagnosis not present

## 2022-05-21 DIAGNOSIS — R7881 Bacteremia: Secondary | ICD-10-CM | POA: Diagnosis present

## 2022-05-21 DIAGNOSIS — Z841 Family history of disorders of kidney and ureter: Secondary | ICD-10-CM

## 2022-05-21 DIAGNOSIS — N400 Enlarged prostate without lower urinary tract symptoms: Secondary | ICD-10-CM | POA: Diagnosis present

## 2022-05-21 DIAGNOSIS — E785 Hyperlipidemia, unspecified: Secondary | ICD-10-CM | POA: Diagnosis not present

## 2022-05-21 DIAGNOSIS — E1165 Type 2 diabetes mellitus with hyperglycemia: Secondary | ICD-10-CM | POA: Diagnosis present

## 2022-05-21 DIAGNOSIS — Z794 Long term (current) use of insulin: Secondary | ICD-10-CM | POA: Diagnosis not present

## 2022-05-21 DIAGNOSIS — Z7984 Long term (current) use of oral hypoglycemic drugs: Secondary | ICD-10-CM | POA: Diagnosis not present

## 2022-05-21 DIAGNOSIS — L298 Other pruritus: Secondary | ICD-10-CM | POA: Diagnosis present

## 2022-05-21 DIAGNOSIS — Z87440 Personal history of urinary (tract) infections: Secondary | ICD-10-CM | POA: Diagnosis not present

## 2022-05-21 DIAGNOSIS — Z66 Do not resuscitate: Secondary | ICD-10-CM | POA: Diagnosis present

## 2022-05-21 DIAGNOSIS — Z79899 Other long term (current) drug therapy: Secondary | ICD-10-CM

## 2022-05-21 DIAGNOSIS — Z7982 Long term (current) use of aspirin: Secondary | ICD-10-CM

## 2022-05-21 DIAGNOSIS — Z9079 Acquired absence of other genital organ(s): Secondary | ICD-10-CM

## 2022-05-21 LAB — URINALYSIS, COMPLETE (UACMP) WITH MICROSCOPIC
Bacteria, UA: NONE SEEN
Bilirubin Urine: NEGATIVE
Glucose, UA: 150 mg/dL — AB
Hgb urine dipstick: NEGATIVE
Ketones, ur: NEGATIVE mg/dL
Leukocytes,Ua: NEGATIVE
Nitrite: NEGATIVE
Protein, ur: 30 mg/dL — AB
Specific Gravity, Urine: 1.016 (ref 1.005–1.030)
pH: 5 (ref 5.0–8.0)

## 2022-05-21 LAB — RESP PANEL BY RT-PCR (RSV, FLU A&B, COVID)  RVPGX2
Influenza A by PCR: NEGATIVE
Influenza B by PCR: NEGATIVE
Resp Syncytial Virus by PCR: NEGATIVE
SARS Coronavirus 2 by RT PCR: NEGATIVE

## 2022-05-21 LAB — CBC WITH DIFFERENTIAL/PLATELET
Abs Immature Granulocytes: 0.05 10*3/uL (ref 0.00–0.07)
Basophils Absolute: 0 10*3/uL (ref 0.0–0.1)
Basophils Relative: 0 %
Eosinophils Absolute: 0.1 10*3/uL (ref 0.0–0.5)
Eosinophils Relative: 1 %
HCT: 30.1 % — ABNORMAL LOW (ref 39.0–52.0)
Hemoglobin: 9.5 g/dL — ABNORMAL LOW (ref 13.0–17.0)
Immature Granulocytes: 0 %
Lymphocytes Relative: 5 %
Lymphs Abs: 0.7 10*3/uL (ref 0.7–4.0)
MCH: 30.8 pg (ref 26.0–34.0)
MCHC: 31.6 g/dL (ref 30.0–36.0)
MCV: 97.7 fL (ref 80.0–100.0)
Monocytes Absolute: 0.7 10*3/uL (ref 0.1–1.0)
Monocytes Relative: 6 %
Neutro Abs: 11.7 10*3/uL — ABNORMAL HIGH (ref 1.7–7.7)
Neutrophils Relative %: 88 %
Platelets: 317 10*3/uL (ref 150–400)
RBC: 3.08 MIL/uL — ABNORMAL LOW (ref 4.22–5.81)
RDW: 15.8 % — ABNORMAL HIGH (ref 11.5–15.5)
WBC: 13.3 10*3/uL — ABNORMAL HIGH (ref 4.0–10.5)
nRBC: 0 % (ref 0.0–0.2)

## 2022-05-21 LAB — LACTIC ACID, PLASMA
Lactic Acid, Venous: 1.6 mmol/L (ref 0.5–1.9)
Lactic Acid, Venous: 1.4 mmol/L (ref 0.5–1.9)

## 2022-05-21 LAB — COMPREHENSIVE METABOLIC PANEL
ALT: 21 U/L (ref 0–44)
AST: 24 U/L (ref 15–41)
Albumin: 3.7 g/dL (ref 3.5–5.0)
Alkaline Phosphatase: 49 U/L (ref 38–126)
Anion gap: 8 (ref 5–15)
BUN: 23 mg/dL (ref 8–23)
CO2: 23 mmol/L (ref 22–32)
Calcium: 8.9 mg/dL (ref 8.9–10.3)
Chloride: 106 mmol/L (ref 98–111)
Creatinine, Ser: 1.29 mg/dL — ABNORMAL HIGH (ref 0.61–1.24)
GFR, Estimated: 55 mL/min — ABNORMAL LOW (ref >60)
Glucose, Bld: 198 mg/dL — ABNORMAL HIGH (ref 70–99)
Potassium: 4.3 mmol/L (ref 3.5–5.1)
Sodium: 137 mmol/L (ref 135–145)
Total Bilirubin: 0.8 mg/dL (ref 0.3–1.2)
Total Protein: 7.2 g/dL (ref 6.5–8.1)

## 2022-05-21 LAB — PROTIME-INR
INR: 1.1 (ref 0.8–1.2)
Prothrombin Time: 14 seconds (ref 11.4–15.2)

## 2022-05-21 LAB — APTT: aPTT: 31 seconds (ref 24–36)

## 2022-05-21 LAB — BRAIN NATRIURETIC PEPTIDE: B Natriuretic Peptide: 364.2 pg/mL — ABNORMAL HIGH (ref 0.0–100.0)

## 2022-05-21 MED ORDER — ACETAMINOPHEN 500 MG PO TABS
1000.0000 mg | ORAL_TABLET | Freq: Once | ORAL | Status: AC
  Administered 2022-05-21: 1000 mg via ORAL
  Filled 2022-05-21: qty 2

## 2022-05-21 MED ORDER — MAGNESIUM HYDROXIDE 400 MG/5ML PO SUSP
30.0000 mL | Freq: Every day | ORAL | Status: DC | PRN
  Administered 2022-05-24: 30 mL via ORAL
  Filled 2022-05-21: qty 30

## 2022-05-21 MED ORDER — ENOXAPARIN SODIUM 40 MG/0.4ML IJ SOSY
40.0000 mg | PREFILLED_SYRINGE | INTRAMUSCULAR | Status: DC
  Administered 2022-05-22 – 2022-05-25 (×4): 40 mg via SUBCUTANEOUS
  Filled 2022-05-21 (×4): qty 0.4

## 2022-05-21 MED ORDER — SODIUM CHLORIDE 0.9 % IV SOLN
2.0000 g | INTRAVENOUS | Status: DC
  Administered 2022-05-22 – 2022-05-25 (×4): 2 g via INTRAVENOUS
  Filled 2022-05-21 (×4): qty 2

## 2022-05-21 MED ORDER — ACETAMINOPHEN 650 MG RE SUPP: 650.0000 mg | Freq: Four times a day (QID) | RECTAL | Status: DC | PRN

## 2022-05-21 MED ORDER — METRONIDAZOLE 500 MG/100ML IV SOLN
500.0000 mg | Freq: Once | INTRAVENOUS | Status: AC
  Administered 2022-05-21: 500 mg via INTRAVENOUS
  Filled 2022-05-21: qty 100

## 2022-05-21 MED ORDER — VANCOMYCIN HCL 2000 MG/400ML IV SOLN
2000.0000 mg | Freq: Once | INTRAVENOUS | Status: AC
  Administered 2022-05-21: 2000 mg via INTRAVENOUS
  Filled 2022-05-21: qty 400

## 2022-05-21 MED ORDER — LACTATED RINGERS IV BOLUS
1000.0000 mL | Freq: Once | INTRAVENOUS | Status: AC
  Administered 2022-05-21: 1000 mL via INTRAVENOUS

## 2022-05-21 MED ORDER — SODIUM CHLORIDE 0.9 % IV SOLN: 500.0000 mg | INTRAVENOUS | Status: DC

## 2022-05-21 MED ORDER — ONDANSETRON HCL 4 MG PO TABS: 4.0000 mg | ORAL_TABLET | Freq: Four times a day (QID) | ORAL | Status: DC | PRN

## 2022-05-21 MED ORDER — TRAZODONE HCL 50 MG PO TABS
25.0000 mg | ORAL_TABLET | Freq: Every evening | ORAL | Status: DC | PRN
  Administered 2022-05-23 – 2022-05-24 (×2): 25 mg via ORAL
  Filled 2022-05-21 (×2): qty 1

## 2022-05-21 MED ORDER — VANCOMYCIN HCL IN DEXTROSE 1-5 GM/200ML-% IV SOLN: 1000.0000 mg | Freq: Once | INTRAVENOUS | Status: DC

## 2022-05-21 MED ORDER — SODIUM CHLORIDE 0.9 % IV SOLN
2.0000 g | Freq: Once | INTRAVENOUS | Status: AC
  Administered 2022-05-21: 2 g via INTRAVENOUS
  Filled 2022-05-21: qty 12.5

## 2022-05-21 MED ORDER — LACTATED RINGERS IV SOLN
150.0000 mL/h | INTRAVENOUS | Status: AC
  Administered 2022-05-22 (×4): 150 mL/h via INTRAVENOUS

## 2022-05-21 MED ORDER — ONDANSETRON HCL 4 MG/2ML IJ SOLN: 4.0000 mg | Freq: Four times a day (QID) | INTRAMUSCULAR | Status: DC | PRN

## 2022-05-21 MED ORDER — ACETAMINOPHEN 325 MG PO TABS
650.0000 mg | ORAL_TABLET | Freq: Four times a day (QID) | ORAL | Status: DC | PRN
  Administered 2022-05-24: 650 mg via ORAL
  Filled 2022-05-21: qty 2

## 2022-05-21 NOTE — Sepsis Progress Note (Signed)
Elink monitoring for the code sepsis protocol.  

## 2022-05-21 NOTE — Consult Note (Signed)
CODE SEPSIS - PHARMACY COMMUNICATION  **Broad Spectrum Antibiotics should be administered within 1 hour of Sepsis diagnosis**  Time Code Sepsis Called/Page Received: 2112  Antibiotics Ordered: cefepime, Vancomycin, metronidazole  Time of 1st antibiotic administration: 2145  Additional action taken by pharmacy: n/a  If necessary, Name of Provider/Nurse Contacted: [place], PharmD, BCPS Clinical Pharmacist   05/21/2022  9:15 PM

## 2022-05-21 NOTE — ED Provider Notes (Signed)
[facility] Provider Note    Event Date/Time   First MD Initiated Contact with Patient 05/21/22 2056     (approximate)   History   Hyperglycemia and Altered Mental Status   HPI  Gregory Tran is a 87 y.o. male who comes in from home due to concern for elevated sugars.  They were concerned that patient's sugars were in the 300s unable to be managed with insulin at home but when EMS got there they noted temperature of 101.5.  They do report that patient has been on antibiotics for UTI that finished 3 to 4 days ago.  They noted that patient was also more confused.  No known falls.   Physical Exam   Triage Vital Signs: ED Triage Vitals  Enc Vitals Group     BP 05/21/22 2105 (!) 149/59     Pulse Rate 05/21/22 2105 95     Resp 05/21/22 2105 (!) 29     Temp 05/21/22 2105 98.4 F (36.9 C)     Temp Source 05/21/22 2105 Oral     SpO2 05/21/22 2105 93 %     Weight --      Height --      Head Circumference --      Peak Flow --      Pain Score 05/21/22 2106 0     Pain Loc --      Pain Edu? --      Excl. in [place]? --     Most recent vital signs: Vitals:   05/21/22 2105  BP: (!) 149/59  Pulse: 95  Resp: (!) 29  Temp: 98.4 F (36.9 C)  SpO2: 93%     General: Awake, no distress.  Feeling warm on examination CV:  Good peripheral perfusion.  Resp:  Normal effort.  Saturations of 92% Abd:  No distention.  Soft and nontender. Other:     ED Results / Procedures / Treatments   Labs (all labs ordered are listed, but only abnormal results are displayed) Labs Reviewed  RESP PANEL BY RT-PCR (RSV, FLU A&B, COVID)  [id number]  CULTURE, BLOOD (ROUTINE X 2)  CULTURE, BLOOD (ROUTINE X 2)  URINE CULTURE  LACTIC ACID, PLASMA  LACTIC ACID, PLASMA  COMPREHENSIVE METABOLIC PANEL  CBC WITH DIFFERENTIAL/PLATELET  PROTIME-INR  APTT  URINALYSIS, COMPLETE (UACMP) WITH MICROSCOPIC     EKG  My interpretation of EKG:  Normal sinus rhythm 90 without any ST  elevation or T wave inversions, normal intervals  RADIOLOGY I have reviewed the xray personally and interpreted and there is some diffuse opacities bilaterally PROCEDURES:  Critical Care performed: Yes, see critical care procedure note(s)  .1-3 Lead EKG Interpretation  Performed by: Vanessa [place], MD Authorized by: Vanessa Martin, MD     Interpretation: normal     ECG rate:  80   ECG rate assessment: normal     Rhythm: sinus rhythm     Ectopy: none     Conduction: normal   .Critical Care  Performed by: Vanessa Tavernier, MD Authorized by: Vanessa Beec[place], MD   Critical care provider statement:    Critical care time (minutes):  30   Critical care was necessary to treat or prevent imminent or life-threatening deterioration of the following conditions:  Sepsis   Critical care was time spent personally by me on the following activities:  Development of treatment plan with patient or surrogate, discussions with consultants, evaluation of patient's response to treatment, examination of  patient, ordering and review of laboratory studies, ordering and review of radiographic studies, ordering and performing treatments and interventions, pulse oximetry, re-evaluation of patient's condition and review of old [facility] ED: Medications  ceFEPIme (MAXIPIME) 2 g in sodium chloride 0.9 % 100 mL IVPB (has no administration in time range)  metroNIDAZOLE (FLAGYL) IVPB 500 mg (has no administration in time range)  vancomycin (VANCOCIN) IVPB 1000 mg/200 mL premix (has no administration in time range)  lactated ringers bolus 1,000 mL (has no administration in time range)  acetaminophen (TYLENOL) tablet 1,000 mg (has no administration in time range)     IMPRESSION / MDM / [place] / ED COURSE  I reviewed the triage vital signs and the nursing notes.   Patient's presentation is most consistent with acute presentation with potential threat to life or bodily function.    Patient comes in with sepsis.  Slightly low oxygen levels are 92%.  Given altered status will get pan CT scans to further evaluate labs to further evaluate.  Broad-spectrum antibiotics were started.  Patient was started with 1 L of fluid given x-ray concerning for edema.  BNP was slightly elevated.  Urine without evidence of UTI COVID, flu were negative.  Creatinine was around baseline.  White count was elevated.  CT scan is concerning for aspiration.  Patient was already given broad-spectrum antibiotics.  Will discuss with hospital team for admission.  Incidental findings on CT scan were discussed with patient's family member in the room for need for follow-up outpatient.  Do not have a concern for mesenteric ischemia given abdomen is soft and nontender  The patient is on the cardiac monitor to evaluate for evidence of arrhythmia and/or significant heart rate changes.      FINAL CLINICAL IMPRESSION(S) / ED DIAGNOSES   Final diagnoses:  Aspiration pneumonia of both lungs, unspecified aspiration pneumonia type, unspecified part of lung     Rx / DC Orders   ED Discharge Orders     None        Note:  This document was prepared using Dragon voice recognition software and may include unintentional dictation errors.   Vanessa [place], MD 05/21/22 [phone]

## 2022-05-21 NOTE — Consult Note (Signed)
PHARMACY -  BRIEF ANTIBIOTIC NOTE   Pharmacy has received consult(s) for vancomycin and cefepime from an ED provider.  The patient's profile has been reviewed for ht/wt/allergies/indication/available labs.    One time order(s) placed for  Cefepime 2 gram Vancomycin 2 gram  Further antibiotics/pharmacy consults should be ordered by admitting physician if indicated.                       Thank you, Dorothe Pea, PharmD, BCPS Clinical Pharmacist   05/21/2022  9:15 PM

## 2022-05-21 NOTE — ED Triage Notes (Signed)
Pt comes from home via [facility] with c/o hyperglycemia. Family reported BGC over 300, family got it in the 66's and it was 282 en route to ED. EMS reports temp of 101.5. Pt received 328mL of NS en route. Per family pt had recent UTI and finish abx about 3-4 days ago. Pt A&Ox2.

## 2022-05-21 NOTE — ED Notes (Signed)
Pt to CT

## 2022-05-22 ENCOUNTER — Encounter: Payer: Self-pay | Admitting: Family Medicine

## 2022-05-22 DIAGNOSIS — A419 Sepsis, unspecified organism: Secondary | ICD-10-CM | POA: Diagnosis not present

## 2022-05-22 DIAGNOSIS — K219 Gastro-esophageal reflux disease without esophagitis: Secondary | ICD-10-CM | POA: Diagnosis present

## 2022-05-22 DIAGNOSIS — J189 Pneumonia, unspecified organism: Secondary | ICD-10-CM

## 2022-05-22 DIAGNOSIS — E785 Hyperlipidemia, unspecified: Secondary | ICD-10-CM | POA: Diagnosis present

## 2022-05-22 DIAGNOSIS — E1142 Type 2 diabetes mellitus with diabetic polyneuropathy: Secondary | ICD-10-CM | POA: Diagnosis present

## 2022-05-22 DIAGNOSIS — N400 Enlarged prostate without lower urinary tract symptoms: Secondary | ICD-10-CM | POA: Diagnosis present

## 2022-05-22 LAB — HEMOGLOBIN A1C
Hgb A1c MFr Bld: 5.9 % — ABNORMAL HIGH (ref 4.8–5.6)
Mean Plasma Glucose: 123 mg/dL

## 2022-05-22 LAB — GLUCOSE, CAPILLARY
Glucose-Capillary: 76 mg/dL (ref 70–99)
Glucose-Capillary: 84 mg/dL (ref 70–99)
Glucose-Capillary: 85 mg/dL (ref 70–99)
Glucose-Capillary: 118 mg/dL — ABNORMAL HIGH (ref 70–99)

## 2022-05-22 LAB — CBC
HCT: 27.8 % — ABNORMAL LOW (ref 39.0–52.0)
Hemoglobin: 8.8 g/dL — ABNORMAL LOW (ref 13.0–17.0)
MCH: 30.7 pg (ref 26.0–34.0)
MCHC: 31.7 g/dL (ref 30.0–36.0)
MCV: 96.9 fL (ref 80.0–100.0)
Platelets: 293 10*3/uL (ref 150–400)
RBC: 2.87 MIL/uL — ABNORMAL LOW (ref 4.22–5.81)
RDW: 15.9 % — ABNORMAL HIGH (ref 11.5–15.5)
WBC: 9.5 10*3/uL (ref 4.0–10.5)
nRBC: 0 % (ref 0.0–0.2)

## 2022-05-22 LAB — BASIC METABOLIC PANEL
Anion gap: 9 (ref 5–15)
BUN: 21 mg/dL (ref 8–23)
CO2: 22 mmol/L (ref 22–32)
Calcium: 8.4 mg/dL — ABNORMAL LOW (ref 8.9–10.3)
Chloride: 108 mmol/L (ref 98–111)
Creatinine, Ser: 1.3 mg/dL — ABNORMAL HIGH (ref 0.61–1.24)
GFR, Estimated: 54 mL/min — ABNORMAL LOW (ref >60)
Glucose, Bld: 91 mg/dL (ref 70–99)
Potassium: 3.8 mmol/L (ref 3.5–5.1)
Sodium: 139 mmol/L (ref 135–145)

## 2022-05-22 LAB — PROTIME-INR
INR: 1.3 — ABNORMAL HIGH (ref 0.8–1.2)
Prothrombin Time: 15.7 seconds — ABNORMAL HIGH (ref 11.4–15.2)

## 2022-05-22 LAB — CORTISOL-AM, BLOOD: Cortisol - AM: 23.3 ug/dL — ABNORMAL HIGH (ref 6.7–22.6)

## 2022-05-22 LAB — MRSA NEXT GEN BY PCR, NASAL: MRSA by PCR Next Gen: NOT DETECTED

## 2022-05-22 LAB — PROCALCITONIN: Procalcitonin: 1.47 ng/mL

## 2022-05-22 MED ORDER — GABAPENTIN 100 MG PO CAPS
100.0000 mg | ORAL_CAPSULE | Freq: Every day | ORAL | Status: DC
  Administered 2022-05-23 – 2022-05-25 (×3): 100 mg via ORAL
  Filled 2022-05-22 (×3): qty 1

## 2022-05-22 MED ORDER — INSULIN ASPART 100 UNIT/ML IJ SOLN
0.0000 [IU] | Freq: Every day | INTRAMUSCULAR | Status: DC
  Administered 2022-05-23: 2 [IU] via SUBCUTANEOUS
  Filled 2022-05-22: qty 1

## 2022-05-22 MED ORDER — PANTOPRAZOLE SODIUM 40 MG PO TBEC
40.0000 mg | DELAYED_RELEASE_TABLET | Freq: Every day | ORAL | Status: DC
  Administered 2022-05-23 – 2022-05-25 (×3): 40 mg via ORAL
  Filled 2022-05-22 (×3): qty 1

## 2022-05-22 MED ORDER — AMMONIUM LACTATE 12 % EX LOTN
1.0000 | TOPICAL_LOTION | Freq: Two times a day (BID) | CUTANEOUS | Status: DC
  Administered 2022-05-22 – 2022-05-25 (×7): 1 via TOPICAL
  Filled 2022-05-22: qty 400

## 2022-05-22 MED ORDER — TAMSULOSIN HCL 0.4 MG PO CAPS
0.4000 mg | ORAL_CAPSULE | Freq: Every day | ORAL | Status: DC
  Administered 2022-05-23 – 2022-05-25 (×3): 0.4 mg via ORAL
  Filled 2022-05-22 (×3): qty 1

## 2022-05-22 MED ORDER — SODIUM CHLORIDE 0.9 % IV SOLN
500.0000 mg | INTRAVENOUS | Status: DC
  Administered 2022-05-22 – 2022-05-23 (×2): 500 mg via INTRAVENOUS
  Filled 2022-05-22: qty 5
  Filled 2022-05-22: qty 500

## 2022-05-22 MED ORDER — INSULIN ASPART 100 UNIT/ML IJ SOLN
0.0000 [IU] | Freq: Three times a day (TID) | INTRAMUSCULAR | Status: DC
  Administered 2022-05-23: 5 [IU] via SUBCUTANEOUS
  Administered 2022-05-23: 3 [IU] via SUBCUTANEOUS
  Administered 2022-05-24: 2 [IU] via SUBCUTANEOUS
  Administered 2022-05-24 (×2): 5 [IU] via SUBCUTANEOUS
  Administered 2022-05-25: 3 [IU] via SUBCUTANEOUS
  Administered 2022-05-25: 2 [IU] via SUBCUTANEOUS
  Filled 2022-05-22 (×7): qty 1

## 2022-05-22 MED ORDER — IPRATROPIUM-ALBUTEROL 0.5-2.5 (3) MG/3ML IN SOLN
3.0000 mL | Freq: Four times a day (QID) | RESPIRATORY_TRACT | Status: DC
  Administered 2022-05-22 – 2022-05-23 (×5): 3 mL via RESPIRATORY_TRACT
  Filled 2022-05-22 (×6): qty 3
  Filled 2022-05-22: qty 6
  Filled 2022-05-22: qty 3

## 2022-05-22 MED ORDER — ALBUTEROL SULFATE (2.5 MG/3ML) 0.083% IN NEBU: 2.5000 mg | INHALATION_SOLUTION | RESPIRATORY_TRACT | Status: DC | PRN

## 2022-05-22 MED ORDER — DM-GUAIFENESIN ER 30-600 MG PO TB12
1.0000 | ORAL_TABLET | Freq: Two times a day (BID) | ORAL | Status: DC
  Administered 2022-05-22 – 2022-05-25 (×7): 1 via ORAL
  Filled 2022-05-22 (×6): qty 1
  Filled 2022-05-22: qty 2

## 2022-05-22 MED ORDER — ASPIRIN 81 MG PO TBEC
81.0000 mg | DELAYED_RELEASE_TABLET | Freq: Every day | ORAL | Status: DC
  Administered 2022-05-22 – 2022-05-25 (×4): 81 mg via ORAL
  Filled 2022-05-22 (×4): qty 1

## 2022-05-22 MED ORDER — DIPHENHYDRAMINE HCL 25 MG PO CAPS
25.0000 mg | ORAL_CAPSULE | Freq: Four times a day (QID) | ORAL | Status: DC | PRN
  Administered 2022-05-22 – 2022-05-25 (×8): 25 mg via ORAL
  Filled 2022-05-22 (×8): qty 1

## 2022-05-22 NOTE — Assessment & Plan Note (Addendum)
Home amlodipine and metoprolol have been on hold to avoid hypotension.  BP's have been controlled, diastolic pressures soft.. --Hold off amlodipine and metoprolol at discharge --Monitor BP's at home 2-3 times daily --Close follow up with Primary Care --Resume med/s as BP tolerates (consistently above 130/80)

## 2022-05-22 NOTE — ED Notes (Signed)
Per receiving RN, Patient is ready to go to floor.

## 2022-05-22 NOTE — Assessment & Plan Note (Addendum)
Home regimen appears to be Tresiba 30 units daily, Novolog 15 units BID AC, metformin 1000 mg daily.   Pt has required much less insulin that his home regimen.  Covered only with sliding scale NovoLog, no basal coverage and sugars are overall at inpatient goal --For discharge, resume metformin, reduce Tresiba to 10 units daily, reduce Novolog to 3 units with meals --Write down all home sugar readings and closely follow up with Primary Care --Anticipate higher sugars once home and resuming usually dietary habits.

## 2022-05-22 NOTE — Assessment & Plan Note (Addendum)
-   Continue Flomax 

## 2022-05-22 NOTE — Progress Notes (Signed)
OT Cancellation Note  Patient Details Name: Gregory Tran MRN: [id number] DOB: 22-Sep-1935   Cancelled Treatment:    Reason Eval/Treat Not Completed: Fatigue/lethargy limiting ability to participate.  Pt's son at bedside and stating that he's been staying with pt for approx 1 month; knows that pt needs more care at home or at facility, although pt is resistant to this and son also wants to respect his father's wishes. Son expressing concerns about having to spend down pt's money before pt would be Medicaid eligible for facility. Son states that he has been completing all IADLs for pt, and also heavily assisting in bathing, dressing, and encouraging food and water intake. Pt still wants to live alone; has 3 cats. Son states that pt uses rollator for mobility prior to admission.  Will attempt evaluation again tomorrow.  Vania Rea 05/22/2022, 3:01 PM

## 2022-05-22 NOTE — H&P (Addendum)
[place]   PATIENT NAME: Sajan Maloof    MR#:  [id number]  DATE OF BIRTH:  1935-05-04  DATE OF ADMISSION:  05/21/2022  PRIMARY CARE PHYSICIAN: Sallee Lange, NP   Patient is coming from: Home  REQUESTING/REFERRING PHYSICIAN: Marjean Donna, MD  CHIEF COMPLAINT:   Chief Complaint  Patient presents with   Hyperglycemia   Altered Mental Status    HISTORY OF PRESENT ILLNESS:  SIVAN PAYAMPS is a 87 y.o. male with medical history significant for coronary artery disease, hypertension, type 2 diabetes mellitus, chronic anemia, dyslipidemia, peripheral neuropathy, TIA and BPH, who presented to the emergency room with acute onset of fever.  The patient has been having cough productive of clear sputum as well as wheezing.  He denied any significant dyspnea.  He denies any significant chills.  No nausea or vomiting or abdominal pain.  No dysuria, oliguria or hematuria or flank pain.  No bleeding diathesis.  ED Course: Awake to the ER, BP was 149/59 heart rate of 95 with respiratory rate of 31 and later 29 with Pulsoxymeter is 93% on room air.  Temperature later on was 102.5.  Labs revealed a blood glucose of 198 and creatinine 1.29 with otherwise unremarkable BMP.  CBC showed leukocytosis 13.3 with neutrophilia and anemia close to baseline.  Respiratory panel was negative and UA was unremarkable.  Blood and urine cultures were drawn. EKG as reviewed by me : EKG showed normal sinus rhythm with rate of 90 with PVCs and low voltage QRS. Imaging: Noncontrasted head CT scan revealed atrophy and chronic small vessel ischemic changes with no acute intracranial process identified.  Portable chest x-ray showed diffuse interstitial prominence with patchy bibasilar airspace disease that may reflect multifocal pneumonia or edema.  The patient was given 1 g of p.o. Tylenol and 1 L bolus of IV lactated Ringer as well as IV vancomycin, cefepime and Flagyl.  He will be admitted to a medical  telemetry bed for further evaluation and management.  PAST MEDICAL HISTORY:   Past Medical History:  Diagnosis Date   Arthritis    BPH (benign prostatic hyperplasia)    Cancer    Basal Cell   Chronic kidney disease    Coronary artery disease    Diabetes mellitus    Ejaculatory disorder    Erectile dysfunction    Frequency    GERD (gastroesophageal reflux disease)    Gross hematuria    Heart disease    Hematuria    HTN (hypertension)    Hyperlipidemia    Incomplete bladder emptying    Microscopic hematuria    Myocardial infarction    Neuropathy    Nocturia    Rotator cuff tear left   TIA (transient ischemic attack)    Tick bite of multiple sites 5 days ago   chest, and groin area   Vertigo    1-2x/yr    PAST SURGICAL HISTORY:   Past Surgical History:  Procedure Laterality Date   cardiac stents     CATARACT EXTRACTION W/PHACO Left 08/02/2018   Procedure: CATARACT EXTRACTION PHACO AND INTRAOCULAR LENS PLACEMENT ([place])  LEFT DIABETIC;  Surgeon: Eulogio Bear, MD;  Location: [place];  Service: Ophthalmology;  Laterality: Left;  diabetes - insulin and oral meds   CATARACT EXTRACTION W/PHACO Right 10/11/2018   Procedure: CATARACT EXTRACTION PHACO AND INTRAOCULAR LENS PLACEMENT ([place]) RIGHT DIABETES;  Surgeon: Eulogio Bear, MD;  Location: [place];  Service: Ophthalmology;  Laterality:  Right;  Diabetic - insulin and oral meds   CORONARY ANGIOPLASTY  2001, 2008   [facility]   DUPUYTREN CONTRACTURE RELEASE Right 04/30/2016   Procedure: DUPUYTREN CONTRACTURE RELEASE;  Surgeon: Leanor Kail, MD;  Location: [facility];  Service: Orthopedics;  Laterality: Right;   KYPHOPLASTY N/A 01/26/2018   Procedure: El[place]anda [place];  Surgeon: Hessie Knows, MD;  Location: [facility];  Service: Orthopedics;  Laterality: N/A;   ROTATOR CUFF REPAIR Bilateral    TRANSURETHRAL RESECTION OF PROSTATE N/A 07/09/2015   Procedure: TRANSURETHRAL RESECTION OF THE PROSTATE (TURP);   Surgeon: Hollice Espy, MD;  Location: [facility];  Service: Urology;  Laterality: N/A;    SOCIAL HISTORY:   Social History   Tobacco Use   Smoking status: Former    Packs/day: 1.00    Years: 50.00    Additional pack years: 0.00    Total pack years: 50.00    Types: Cigarettes    Quit date: 06/26/2017    Years since quitting: 4.9   Smokeless tobacco: Never  Substance Use Topics   Alcohol use: No    Alcohol/week: 0.0 standard drinks of alcohol    FAMILY HISTORY:   Family History  Problem Relation Age of Onset   Ovarian cancer Sister    Kidney disease Brother        born one kidney   Bladder Cancer Neg Hx    Prostate cancer Neg Hx     DRUG ALLERGIES:   Allergies  Allergen Reactions   Atorvastatin Other (See Comments)   B Complex-Folic Acid Hives    REVIEW OF SYSTEMS:   ROS As per history of present illness. All pertinent systems were reviewed above. Constitutional, HEENT, cardiovascular, respiratory, GI, GU, musculoskeletal, neuro, psychiatric, endocrine, integumentary and hematologic systems were reviewed and are otherwise negative/unremarkable except for positive findings mentioned above in the HPI.   MEDICATIONS AT HOME:   Prior to Admission medications   Medication Sig Start Date End Date Taking? Authorizing Provider  amLODipine (NORVASC) 10 MG tablet Take 10 mg by mouth daily.    [provider]  ammonium lactate (AMLACTIN) 12 % cream Apply 1 Application topically 2 (two) times daily. (Apply to back)    [provider]  aspirin EC 81 MG tablet Take 81 mg by mouth daily.     [provider]  gabapentin (NEURONTIN) 100 MG capsule Take 100 mg by mouth daily. 04/14/22   [provider]  insulin aspart (NOVOLOG) 100 UNIT/ML FlexPen Inject 15 Units into the skin 2 (two) times daily before a meal. 05/06/22   Wouk, Ailene Rud, MD  insulin degludec (TRESIBA) 100 UNIT/ML FlexTouch Pen Inject 30 Units into the skin daily. 05/06/22    Wouk, Ailene Rud, MD  metFORMIN (GLUCOPHAGE) 1000 MG tablet Take 1,000 mg by mouth daily.    [provider]  metoprolol succinate (TOPROL-XL) 25 MG 24 hr tablet Take 25 mg by mouth daily. 04/14/22   [provider]  pantoprazole (PROTONIX) 40 MG tablet Take 40 mg by mouth daily.     [provider]  rosuvastatin (CRESTOR) 5 MG tablet Take 5 mg by mouth every other day.     [provider]  tamsulosin (FLOMAX) 0.4 MG CAPS capsule TAKE 1 CAPSULE(0.4 MG) BY MOUTH DAILY 12/30/16   Hollice Espy, MD      VITAL SIGNS:  Blood pressure (!) 148/58, pulse 87, temperature 97.8 F (36.6 C), resp. rate 18, height 5\' 8"  (1.727 m), weight 84 kg, SpO2 100 %.  PHYSICAL EXAMINATION:  Physical Exam  GENERAL:  87 y.o.-year-old Caucasian male patient lying in the bed with no acute distress.  EYES: Pupils equal, round, reactive to light and accommodation. No scleral icterus. Extraocular muscles intact.  HEENT: Head atraumatic, normocephalic. Oropharynx and nasopharynx clear.  NECK:  Supple, no jugular venous distention. No thyroid enlargement, no tenderness.  LUNGS: Diminished bibasilar breath sounds with bibasal crackles.  No use of accessory muscles of respiration.  CARDIOVASCULAR: Regular rate and rhythm, S1, S2 normal. No murmurs, rubs, or gallops.  ABDOMEN: Soft, nondistended, nontender. Bowel sounds present. No organomegaly or mass.  EXTREMITIES: No pedal edema, cyanosis, or clubbing.  NEUROLOGIC: Cranial nerves II through XII are intact. Muscle strength 5/5 in all extremities. Sensation intact. Gait not checked.  PSYCHIATRIC: The patient is alert and oriented x 3.  Normal affect and good eye contact. SKIN: No obvious rash, lesion, or ulcer.   LABORATORY PANEL:   CBC Recent Labs  Lab 05/21/22 2119  WBC 13.3*  HGB 9.5*  HCT 30.1*  PLT 317    ------------------------------------------------------------------------------------------------------------------  Chemistries  Recent Labs  Lab 05/21/22 2119  NA 137  K 4.3  CL 106  CO2 23  GLUCOSE 198*  BUN 23  CREATININE 1.29*  CALCIUM 8.9  AST 24  ALT 21  ALKPHOS 49  BILITOT 0.8   ------------------------------------------------------------------------------------------------------------------  Cardiac Enzymes No results for input(s): "TROPONINI" in the last 168 hours. ------------------------------------------------------------------------------------------------------------------  RADIOLOGY:  CT CHEST ABDOMEN PELVIS WO CONTRAST  Result Date: 05/21/2022 CLINICAL DATA:  Sepsis, fever, hyperglycemia EXAM: CT CHEST, ABDOMEN AND PELVIS WITHOUT CONTRAST TECHNIQUE: Multidetector CT imaging of the chest, abdomen and pelvis was performed following the standard protocol without IV contrast. RADIATION DOSE REDUCTION: This exam was performed according to the departmental dose-optimization program which includes automated exposure control, adjustment of the mA and/or kV according to patient size and/or use of iterative reconstruction technique. COMPARISON:  None Available. FINDINGS: CT CHEST FINDINGS Cardiovascular: Extensive multi-vessel coronary artery calcification. Cardiac size within normal limits. No pericardial effusion. Central pulmonary arteries are of normal caliber. Extensive atherosclerotic calcification within the thoracic aorta. Mediastinum/Nodes: The visualized thyroid is unremarkable. No pathologic thoracic adenopathy. The distal esophagus appears mildly circumferentially thick walled small amount of fluid is seen layering within the mid esophagus which may reflect changes of esophagitis in the setting of gastroesophageal reflux or esophageal dysmotility. This is not well assessed on this examination. Lungs/Pleura: Reticulonodular infiltrates have progressed within the lung  bases bilaterally in keeping with atypical infection or aspiration. Trace superimposed interstitial pulmonary edema and bilateral pleural effusions are present. No pneumothorax. No central obstructing lesion. Bronchial wall thickening in keeping with airway inflammation noted. Musculoskeletal: No acute bone abnormality. No lytic or blastic bone lesion. CT ABDOMEN PELVIS FINDINGS Hepatobiliary: No focal liver abnormality is seen. No gallstones, gallbladder wall thickening, or biliary dilatation. Pancreas: Unremarkable Spleen: Unremarkable Adrenals/Urinary Tract: The adrenal glands are unremarkable. The kidneys are normal in size and position. Vascular calcifications noted within the renal hila bilaterally. No definite urinary renal or ureteral calculi. The bladder is distended but is otherwise unremarkable. Stomach/Bowel: Moderate colonic stool burden without evidence of obstruction. Stomach, small bowel, and large bowel are otherwise unremarkable. Appendix normal. No free intraperitoneal gas or fluid. Vascular/Lymphatic: Extensive aortoiliac atherosclerotic calcification. Particularly prominent atherosclerotic calcification noted at the origin of the superior mesenteric artery, however, the degree of stenosis is not well assessed on this noncontrast examination. Extensive calcification also noted within the lower extremity arterial inflow bilaterally. No aortic aneurysm.  No pathologic adenopathy within the abdomen and pelvis. Reproductive: Moderate prostatic hypertrophy. Other: No abdominal wall hernia. Musculoskeletal: L4 and L5 vertebral augmentation has been performed. Osseous structures are otherwise age-appropriate. No acute bone abnormality. No lytic or blastic bone lesion. IMPRESSION: 1. Progressive reticulonodular infiltrates within the lung bases bilaterally in keeping with atypical infection or aspiration. 2. Trace superimposed interstitial pulmonary edema and bilateral pleural effusions. 3. Extensive  multi-vessel coronary artery calcification. 4. Mild circumferentially thick walled distal esophagus which may reflect changes of esophagitis in the setting of gastroesophageal reflux or esophageal dysmotility. Correlation with endoscopy may be helpful for further evaluation. 5. Moderate colonic stool burden without evidence of obstruction. 6. Extensive aortoiliac atherosclerotic calcification. Particularly prominent atherosclerotic calcification at the origin of the superior mesenteric artery, however, the degree of stenosis is not well assessed on this noncontrast examination. If there is clinical evidence of chronic mesenteric ischemia, CT arteriography may be helpful for further evaluation. 7. Moderate prostatic hypertrophy. Aortic Atherosclerosis (ICD10-I70.0). Electronically Signed   By: Fidela Salisbury M.D.   On: 05/21/2022 23:00   CT HEAD WO CONTRAST (5MM)  Result Date: 05/21/2022 CLINICAL DATA:  Headache. EXAM: CT HEAD WITHOUT CONTRAST TECHNIQUE: Contiguous axial images were obtained from the base of the skull through the vertex without intravenous contrast. RADIATION DOSE REDUCTION: This exam was performed according to the departmental dose-optimization program which includes automated exposure control, adjustment of the mA and/or kV according to patient size and/or use of iterative reconstruction technique. COMPARISON:  None Available. FINDINGS: Brain: There is periventricular white matter decreased attenuation consistent with small vessel ischemic changes. Ventricles, sulci and cisterns are prominent consistent with age related involutional changes. No acute intracranial hemorrhage, mass effect or shift. No hydrocephalus. Vascular: No hyperdense vessel or unexpected calcification. Skull: Normal. Negative for fracture or focal lesion. Sinuses/Orbits: No acute finding. IMPRESSION: Atrophy and chronic small vessel ischemic changes. No acute intracranial process identified. Electronically Signed   By:  Sammie Bench M.D.   On: 05/21/2022 22:08   DG Chest Port 1 View  Result Date: 05/21/2022 CLINICAL DATA:  Sepsis, hyperglycemia, febrile EXAM: PORTABLE CHEST 1 VIEW COMPARISON:  04/03/2022 FINDINGS: Single frontal view of the chest demonstrates an unremarkable cardiac silhouette. There is diffuse increased interstitial prominence, with patchy bibasilar airspace disease. No effusion or pneumothorax. No acute bony abnormalities. IMPRESSION: 1. Diffuse interstitial prominence with patchy bibasilar airspace disease, which may reflect multifocal pneumonia or edema. Electronically Signed   By: Randa Ngo M.D.   On: 05/21/2022 21:40      IMPRESSION AND PLAN:  Assessment and Plan: * Sepsis due to pneumonia - This is suspicious for atypical pneumonia or aspiration pneumonia. - Sepsis manifested by leukocytosis and fever of 102.5 and respiratory rate of 25. - The patient will be admitted to a medical telemetry bed. - Will continue antibiotic therapy with IV Rocephin and Zithromax. - Mucolytic therapy be provided as well as duo nebs q.i.d. and q.4 hours p.r.n. - We will follow blood cultures.   GERD without esophagitis - We will continue PPI therapy.  BPH (benign prostatic hyperplasia) - We will continue Flomax.  Dyslipidemia - We will continue statin therapy.  Type 2 diabetes mellitus with peripheral neuropathy - The patient will be placed on supplemental coverage with NovoLog. - We will continue his basal coverage. - We will hold off metformin. - We will continue Neurontin.  Essential hypertension - We will continue his antihypertensives.       DVT prophylaxis: Lovenox.  Advanced Care  Planning:  Code Status: The patient is DNR.  He agrees with intubation for respiratory failure/arrest without cardiac arrest. Family Communication:  The plan of care was discussed in details with the patient (and family). I answered all questions. The patient agreed to proceed with the above  mentioned plan. Further management will depend upon hospital course. Disposition Plan: Back to previous home environment Consults called: none.  All the records are reviewed and case discussed with ED provider.  Status is: Inpatient    At the time of the admission, it appears that the appropriate admission status for this patient is inpatient.  This is judged to be reasonable and necessary in order to provide the required intensity of service to ensure the patient's safety given the presenting symptoms, physical exam findings and initial radiographic and laboratory data in the context of comorbid conditions.  The patient requires inpatient status due to high intensity of service, high risk of further deterioration and high frequency of surveillance required.  I certify that at the time of admission, it is my clinical judgment that the patient will require inpatient hospital care extending more than 2 midnights.                            Dispo: The patient is from: Home              Anticipated d/c is to: Home              Patient currently is not medically stable to d/c.              Difficult to place patient: No  Christel Mormon M.D on 05/22/2022 at 5:26 AM  [facility]   From 7 PM-7 AM, contact night-coverage [email or web address]  CC: Primary care physician; Gauger, Victoriano Lain, NP

## 2022-05-22 NOTE — Care Management Important Message (Signed)
Important Message  Patient Details  Name: Gregory Tran MRN: [id number] Date of Birth: 16-Jun-1935   Medicare Important Message Given:  N/A - LOS <3 / Initial given by admissions     Dannette Barbara 05/22/2022, 4:37 PM

## 2022-05-22 NOTE — Assessment & Plan Note (Addendum)
Continue statin. 

## 2022-05-22 NOTE — Assessment & Plan Note (Addendum)
Suspicious for atypical pneumonia vs aspiration. Family deny noticing signs of aspiration food or drink. Sepsis manifested by leukocytosis and fever of 102.5 and respiratory rate of 25. --Treated with IV Rocephin and Zithromax, scheduled Mucinex, Duonebs q6h while awake, PRN albuterol nebs --Patient has clinically improved significantly --Discharge on PO Augmentin to complete 7 day course  --Sputum culture appears not collected --MRSA screen negative --Flutter valve --Supplement O2 if sats < 90% on room air --SLP evaluated - no signs of aspiration

## 2022-05-22 NOTE — Assessment & Plan Note (Addendum)
Continue PPI ?

## 2022-05-22 NOTE — Progress Notes (Signed)
Progress Note   Patient: Gregory Tran [id number] DOB: 13-Aug-1935 DOA: 05/21/2022     1 DOS: the patient was seen and examined on 05/22/2022   Brief hospital course: Gregory Tran is a 87 y.o. male with medical history significant for CAD, HTN, type 2 diabetes, chronic anemia, dyslipidemia, peripheral neuropathy, TIA and BPH, who presented to the ED on 05/21/2022 for evaluation of fever and progressively worsening cough and wheezing. Family also note confusion worse than at baseline.  ED Course: vitals BP 149/59., HR 95, RR 31, spO2 93% on room air.  Febrile with Tmax 102.5.   Labs were notable for glucose of 198, Cr 1.29 with otherwise unremarkable BMP.  CBC showed leukocytosis 13.3 with neutrophilia and anemia close to baseline.   Respiratory panel was negative and UA was unremarkable.  Imaging: Noncontrasted head CT with no acute findings.  Chest x-ray showed diffuse interstitial prominence with patchy bibasilar airspace disease that may reflect multifocal pneumonia or edema.   Patient was admitted to the hospital and started on empiric IV antibiotics for multifocal pneumonia.  Assessment and Plan: * Sepsis due to pneumonia Suspicious for atypical pneumonia vs aspiration. Family deny noticing signs of aspiration food or drink. Sepsis manifested by leukocytosis and fever of 102.5 and respiratory rate of 25. --Continue IV Rocephin and Zithromax - Scheduled Mucinex --Duonebs q6h while awake --PRN albuterol nebs --Collect sputum culture --MRSA screen and add coverage if + --Follow blood cultures --Flutter valve --Supplement O2 if sats < 90% on room air --SLP consulted for swallow eval    GERD without esophagitis Continue PPI   BPH (benign prostatic hyperplasia) Continue Flomax  Dyslipidemia Continue statin  Type 2 diabetes mellitus with peripheral neuropathy Home regimen appears to be Tresiba 30 units daily, Novolog 15 units BID AC, metformin 1000 mg daily Sugars  are borderline. --Sliding scale NovoLog --Resume basal when needed - Hold metformin - Continue Neurontin  Essential hypertension Home amlodipine and metoprolol are on hold. BP's overall controlled, soft at times (109/54). Continue holding for now.        Subjective: Pt seen this AM with his sister and son at bedside.  He reports being very tired, drifts to sleep intermittently during the encounter.  Reports cough productive of clear phlegm.  Feels run down, weak, lowsy. Family deny seeing him cough or choke with eating or drinking.  They are concerned of him living on his own, noting signs of memory issues.    Physical Exam: Vitals:   05/22/22 0412 05/22/22 0642 05/22/22 0757 05/22/22 1500  BP: (!) 148/58 131/61 134/61 (!) 132/59  Pulse: 87 100 88 84  Resp: 18 16 20 18   Temp: 97.8 F (36.6 C) 97.8 F (36.6 C) 98.7 F (37.1 C) 98.1 F (36.7 C)  TempSrc:      SpO2: 100% 93% 93% 95%  Weight:      Height:       General exam: awake, appears fatigued, drifts to sleep intermittently, no acute distress HEENT: moist mucus membranes, hearing grossly normal  Respiratory system: diffuse rhonchi, expiratory wheezes, mildly increased respiratory effort with accessory muscle use, on room air Cardiovascular system: normal S1/S2, RRR, no JVD, murmurs, rubs, gallops, no pedal edema.   Gastrointestinal system: soft, NT, ND, no HSM felt, +bowel sounds. Central nervous system: A&O x2+ (self, hospital). no gross focal neurologic deficits, normal speech Extremities: moves all, no edema, normal tone Skin: dry, intact, normal temperature Psychiatry: normal mood, flat affect   Data Reviewed:  Notable  labs --- Cr 1.30, Ca 8.4, GFR 54.  Procal elevated 1.47. Normal lactate 1.4.  Normal WBC, Hbg 8.8, AM cortisol 23.3  Micro --  MRSA screen negative. Blood cultures - negative at < 24 hrs Sputum culture - pending  Family Communication: sister and son at bedside  Disposition: Status is:  Inpatient Remains inpatient appropriate because: remains on IV antibiotics pending cultures and further clinical improvement.   Planned Discharge Destination: SNF vs HH    Time spent: 46 minutes  Author: Ezekiel Slocumb, DO 05/22/2022 6:46 PM  For on call review [email or web address].

## 2022-05-22 NOTE — Progress Notes (Signed)
PT Cancellation Note  Patient Details Name: Gregory Tran MRN: [id number] DOB: [birth year]   Cancelled Treatment:    Reason Eval/Treat Not Completed: Fatigue/lethargy limiting ability to participate (Chart reviewed, order received- pt familar to our services from prior admission. Spoke to patient and Son at bedside- pt still feelign quite poorly and not able to partake in assessment at this time, he asks to defer assessment to follow day.) Discussed c son interim mobility, rehab, and home life since prior DC. Sounds as though finding LTC options has been difficult and not close to resolution at this time. Son still trying to honor pt's wishes to remain in home while acknowledging that pt is unable to safely be alone. Will attempt again at later date/time.   3:14 PM, 05/22/22 Etta Grandchild, PT, DPT Physical Therapist - [facility]  [phone] ([place])     [place] C 05/22/2022, 3:12 PM

## 2022-05-22 NOTE — Hospital Course (Signed)
Gregory Tran is a 87 y.o. male with medical history significant for CAD, HTN, type 2 diabetes, chronic anemia, dyslipidemia, peripheral neuropathy, TIA and BPH, who presented to the ED on 05/21/2022 for evaluation of fever and progressively worsening cough and wheezing. Family also note confusion worse than at baseline.  ED Course: vitals BP 149/59., HR 95, RR 31, spO2 93% on room air.  Febrile with Tmax 102.5.   Labs were notable for glucose of 198, Cr 1.29 with otherwise unremarkable BMP.  CBC showed leukocytosis 13.3 with neutrophilia and anemia close to baseline.   Respiratory panel was negative and UA was unremarkable.  Imaging: Noncontrasted head CT with no acute findings.  Chest x-ray showed diffuse interstitial prominence with patchy bibasilar airspace disease that may reflect multifocal pneumonia or edema.   Patient was admitted to the hospital and started on empiric IV antibiotics for multifocal pneumonia.

## 2022-05-22 NOTE — Progress Notes (Signed)
Mobility Specialist - Progress Note   05/22/22 1558  Mobility  Activity Stood at bedside  Level of Assistance Minimal assist, patient does 75% or more  Assistive Device Front wheel walker  Activity Response Tolerated well  $Mobility charge 1 Mobility   MS responding to bed alarm, pt sitting EOB upon arrival. Pt requesting to stand and use the urinal. Pt STS to RW minA-SBA, stood at the bedside and used the urinal. Pt returned seated, doffed sweat pants and donned gown. Pt returned supine, left with alarm set and needs within reach. Pt requesting for "lotion" on legs, NT notified.   Candie Mile Mobility Specialist 05/22/22 4:06 PM

## 2022-05-23 LAB — BASIC METABOLIC PANEL
Anion gap: 11 (ref 5–15)
BUN: 24 mg/dL — ABNORMAL HIGH (ref 8–23)
CO2: 19 mmol/L — ABNORMAL LOW (ref 22–32)
Calcium: 8.1 mg/dL — ABNORMAL LOW (ref 8.9–10.3)
Chloride: 108 mmol/L (ref 98–111)
Creatinine, Ser: 1.48 mg/dL — ABNORMAL HIGH (ref 0.61–1.24)
GFR, Estimated: 46 mL/min — ABNORMAL LOW (ref >60)
Glucose, Bld: 110 mg/dL — ABNORMAL HIGH (ref 70–99)
Potassium: 3.9 mmol/L (ref 3.5–5.1)
Sodium: 138 mmol/L (ref 135–145)

## 2022-05-23 LAB — CBC
HCT: 31.5 % — ABNORMAL LOW (ref 39.0–52.0)
Hemoglobin: 10.1 g/dL — ABNORMAL LOW (ref 13.0–17.0)
MCH: 30.9 pg (ref 26.0–34.0)
MCHC: 32.1 g/dL (ref 30.0–36.0)
MCV: 96.3 fL (ref 80.0–100.0)
Platelets: 289 10*3/uL (ref 150–400)
RBC: 3.27 MIL/uL — ABNORMAL LOW (ref 4.22–5.81)
RDW: 15.9 % — ABNORMAL HIGH (ref 11.5–15.5)
WBC: 9.9 10*3/uL (ref 4.0–10.5)
nRBC: 0 % (ref 0.0–0.2)

## 2022-05-23 LAB — GLUCOSE, CAPILLARY
Glucose-Capillary: 104 mg/dL — ABNORMAL HIGH (ref 70–99)
Glucose-Capillary: 248 mg/dL — ABNORMAL HIGH (ref 70–99)
Glucose-Capillary: 160 mg/dL — ABNORMAL HIGH (ref 70–99)
Glucose-Capillary: 238 mg/dL — ABNORMAL HIGH (ref 70–99)

## 2022-05-23 LAB — MAGNESIUM: Magnesium: 1.8 mg/dL (ref 1.7–2.4)

## 2022-05-23 LAB — PROCALCITONIN: Procalcitonin: 1.64 ng/mL

## 2022-05-23 MED ORDER — AZITHROMYCIN 250 MG PO TABS
500.0000 mg | ORAL_TABLET | Freq: Every day | ORAL | Status: DC
  Administered 2022-05-24 – 2022-05-25 (×2): 500 mg via ORAL
  Filled 2022-05-23 (×2): qty 2

## 2022-05-23 NOTE — Evaluation (Addendum)
Clinical/Bedside Swallow Evaluation Patient Details  Name: Gregory Tran MRN: [id number] Date of Birth: 1936/02/15  Today's Date: 05/23/2022 Time: SLP Start Time (ACUTE ONLY): 0930 SLP Stop Time (ACUTE ONLY): 1015 SLP Time Calculation (min) (ACUTE ONLY): 45 min  Past Medical History:  Past Medical History:  Diagnosis Date   Arthritis    BPH (benign prostatic hyperplasia)    Cancer    Basal Cell   Chronic kidney disease    Coronary artery disease    Diabetes mellitus    Ejaculatory disorder    Erectile dysfunction    Frequency    GERD (gastroesophageal reflux disease)    Gross hematuria    Heart disease    Hematuria    HTN (hypertension)    Hyperlipidemia    Incomplete bladder emptying    Microscopic hematuria    Myocardial infarction    Neuropathy    Nocturia    Rotator cuff tear left   TIA (transient ischemic attack)    Tick bite of multiple sites 5 days ago   chest, and groin area   Vertigo    1-2x/yr   Past Surgical History:  Past Surgical History:  Procedure Laterality Date   cardiac stents     CATARACT EXTRACTION W/PHACO Left 08/02/2018   Procedure: CATARACT EXTRACTION PHACO AND INTRAOCULAR LENS PLACEMENT (IOC)  LEFT DIABETIC;  Surgeon: Nevada Crane, MD;  Location: [facility];  Service: Ophthalmology;  Laterality: Left;  diabetes - insulin and oral meds   CATARACT EXTRACTION W/PHACO Right 10/11/2018   Procedure: CATARACT EXTRACTION PHACO AND INTRAOCULAR LENS PLACEMENT (IOC) RIGHT DIABETES;  Surgeon: Nevada Crane, MD;  Location: [facility];  Service: Ophthalmology;  Laterality: Right;  Diabetic - insulin and oral meds   CORONARY ANGIOPLASTY  2001, 2008   [facility]   DUPUYTREN CONTRACTURE RELEASE Right 04/30/2016   Procedure: DUPUYTREN CONTRACTURE RELEASE;  Surgeon: Erin Sons, MD;  Location: [facility];  Service: Orthopedics;  Laterality: Right;   KYPHOPLASTY N/A 01/26/2018   Procedure: Pearletha Furl;  Surgeon: Kennedy Bucker, MD;   Location: [facility];  Service: Orthopedics;  Laterality: N/A;   ROTATOR CUFF REPAIR Bilateral    TRANSURETHRAL RESECTION OF PROSTATE N/A 07/09/2015   Procedure: TRANSURETHRAL RESECTION OF THE PROSTATE (TURP);  Surgeon: Vanna Scotland, MD;  Location: [facility];  Service: Urology;  Laterality: N/A;   HPI:  Pt is a 87 y.o. male with medical history significant for coronary artery disease, hypertension, type 2 diabetes mellitus, chronic anemia, dyslipidemia, peripheral neuropathy, TIA and BPH, who presented to the emergency room with acute onset of fever.  The patient has been having cough productive of clear sputum as well as wheezing.  He denied any significant dyspnea.  He denies any significant chills.  No nausea or vomiting or abdominal pain.  No dysuria, oliguria or hematuria or flank pain.   CXR:  interstitial prominence with patchy bibasilar airspace  disease, which may reflect multifocal pneumonia or edema.  HEADT CT: Atrophy and chronic small vessel ischemic changes. No acute  intracranial process identified    Assessment / Plan / Recommendation  Clinical Impression    Pt seen for BSE this morning. Pt awake, verbal and participated fully in BSE. Pt followed instuctions w/ cues. Min HOH. Pt A/O x3. Few cues needed to redirect attention. Son eager to learn any information to help his Father and was surprised to learn about REFLUX impact on swallowing.  On RA; afebrile, WBC not elevated.    OF NOTE: Pt  and Son strongly endorse s/s of REFLUX at home; self-medicates w/ TUMS. He endorsed mild coughing after meals; "Heartburn". Pt has not followed up w/ GI for management.    Pt appears to present w/ functional oropharyngeal phase swallowing w/ No overt oropharyngeal phase dysphagia appreciated during oral intake of trials; No neuromuscular swallowing deficits appreciated. Pt appears at reduced risk for aspiration from an oropharyngeal phase standpoint following general aspiration precautions.  HOWEVER,  pt has a baseline presentation of REFLUX and episodes of REFLUX behavior, including reported "heartburn". He stated he is not on a PPI. ANY Dysmotility or Regurgitation of Reflux material can increase risk for aspiration of the Reflux material during Retrograde flow thus impact Pulmonary status(noted Imaging/CXR). Pt described issues of globus, cough, and "heartburn".    Pt sat upright in bed w/ cues for more upright positioning and consumed trials of thin liquids Via Cup, purees, and moistened solid foods w/ No immediate, overt clinical s/s of aspiration noted; clear vocal quality b/t trials, no cough, no decline in pulmonary status, no multiple swallows noted post initial pharyngeal swallow. [facility] for bolus management, mastication, and timely A-P transfer/clearing of material. Mastication effort/time appropriate for boluses. [facility]; no unilateral lingual/labial weakness assessed. Speech clear. Belching noted x2 w/ oral intake/trials.   Recommend continue a fairly Regular diet (moistened foods) w/ thin liquids. General aspiration precautions. Rest Breaks during meals/oral intake to allow for Esophageal clearing. REFLUX precautions strongly recommended to lessen chance for Regurgitation -- including less straw and carbonated drinks during meals (air swallowed), alternating foods/drinks, smaller/more frequent meals, remain elevated for 45-60 mins post meals. HOB elevated at night when sleeping.    Recommend pt f/u w/ GI post Discharge for assessment/management/education of REFLUX and tx as indicated, PPI possibly. Discussion and handouts given on REFLUX, impact of REFLUX on swallowing and breathing, behaviors to manage REFLUX, and foods/diet.  MD to reconsult ST services if any new needs while admitted. NSG updated. Son and pt appreciative of Education information. SLP Visit Diagnosis: Dysphagia, unspecified (R13.10) (RELUX activity baseline)    Aspiration Risk    (reduced)    Diet Recommendation   a fairly Regular diet (moistened foods) w/ thin liquids. General aspiration precautions. Rest Breaks during meals/oral intake to allow for Esophageal clearing. REFLUX precautions strongly recommended to lessen chance for Regurgitation -- including less straw and carbonated drinks during meals (air swallowed), alternating foods/drinks, smaller/more frequent meals, remain elevated for 45-60 mins post meals. HOB elevated at night when sleeping.  Medication Administration: Whole meds with liquid (vs in a puree)    Other  Recommendations Recommended Consults: Consider esophageal assessment;Consider GI evaluation (Dietician f/u for support) Oral Care Recommendations: Oral care BID;Oral care before and after PO;Patient independent with oral care (setup)    Recommendations for follow up therapy are one component of a multi-disciplinary discharge planning process, led by the attending physician.  Recommendations may be updated based on patient status, additional functional criteria and insurance authorization.  Follow up Recommendations No SLP follow up      Assistance Recommended at Discharge  intermittent  Functional Status Assessment Patient has not had a recent decline in their functional status  Frequency and Duration  (n/a)   (n/a)       Prognosis Prognosis for improved oropharyngeal function: Good Barriers to Reach Goals: Time post onset;Severity of deficits;Behavior (REFLUX activitey) Barriers/Prognosis Comment: REFLUX activity      Swallow Study   General Date of Onset:  05/21/22 HPI: Pt is a 87 y.o. male with medical history significant for coronary artery disease, hypertension, type 2 diabetes mellitus, chronic anemia, dyslipidemia, peripheral neuropathy, TIA and BPH, who presented to the emergency room with acute onset of fever.  The patient has been having cough productive of clear sputum as well as wheezing.  He denied any significant dyspnea.  He  denies any significant chills.  No nausea or vomiting or abdominal pain.  No dysuria, oliguria or hematuria or flank pain.   CXR:  interstitial prominence with patchy bibasilar airspace  disease, which may reflect multifocal pneumonia or edema.  HEADT CT: Atrophy and chronic small vessel ischemic changes. No acute  intracranial process identified Type of Study: Bedside Swallow Evaluation Previous Swallow Assessment: none Diet Prior to this Study: Regular;Thin liquids (Level 0) Temperature Spikes Noted: No (wbc WNL) Respiratory Status: Room air History of Recent Intubation: No Behavior/Cognition: Alert;Cooperative;Pleasant mood (min HOH) Oral Cavity Assessment: Within Functional Limits Oral Care Completed by SLP: Yes (and pt) Oral Cavity - Dentition: Adequate natural dentition Vision: Functional for self-feeding Self-Feeding Abilities: Able to feed self;Needs set up Patient Positioning: Upright in bed (needed positioning support) Baseline Vocal Quality: Normal Volitional Cough: Strong Volitional Swallow: Able to elicit    Oral/Motor/Sensory Function Overall Oral Motor/Sensory Function: Within functional limits   Ice Chips Ice chips: Within functional limits Presentation: Spoon (fed; 2 trials)   Thin Liquid Thin Liquid: Within functional limits Presentation: Cup;Self Fed (~8 ozs total) Other Comments: water, juice    Nectar Thick Nectar Thick Liquid: Not tested   Honey Thick Honey Thick Liquid: Not tested   Puree Puree: Within functional limits Presentation: Self Fed;Spoon (4 ozs)   Solid     Solid: Within functional limits Presentation: Self Fed (8 trials)         Jerilynn Som, MS, CCC-SLP Speech Language Pathologist Rehab Services; [facility] -  [phone] (ascom) Danniel Tones 05/23/2022,10:44 AM

## 2022-05-23 NOTE — Progress Notes (Signed)
Progress Note   Patient: Gregory Tran HKF:[id number] DOB: [birth year] DOA: 05/21/2022     2 DOS: the patient was seen and examined on 05/23/2022   Brief hospital course: Gregory Tran is a 87 y.o. male with medical history significant for CAD, HTN, type 2 diabetes, chronic anemia, dyslipidemia, peripheral neuropathy, TIA and BPH, who presented to the ED on 05/21/2022 for evaluation of fever and progressively worsening cough and wheezing. Family also note confusion worse than at baseline.  ED Course: vitals BP 149/59., HR 95, RR 31, spO2 93% on room air.  Febrile with Tmax 102.5.   Labs were notable for glucose of 198, Cr 1.29 with otherwise unremarkable BMP.  CBC showed leukocytosis 13.3 with neutrophilia and anemia close to baseline.   Respiratory panel was negative and UA was unremarkable.  Imaging: Noncontrasted head CT with no acute findings.  Chest x-ray showed diffuse interstitial prominence with patchy bibasilar airspace disease that may reflect multifocal pneumonia or edema.   Patient was admitted to the hospital and started on empiric IV antibiotics for multifocal pneumonia.  Assessment and Plan: * Sepsis due to pneumonia Suspicious for atypical pneumonia vs aspiration. Family deny noticing signs of aspiration food or drink. Sepsis manifested by leukocytosis and fever of 102.5 and respiratory rate of 25. --Continue IV Rocephin and Zithromax - Scheduled Mucinex --Duonebs q6h while awake --PRN albuterol nebs --Follow up sputum culture --MRSA screen negative --Follow blood cultures --Flutter valve --Supplement O2 if sats < 90% on room air --SLP consulted for swallow eval    GERD without esophagitis Continue PPI   BPH (benign prostatic hyperplasia) Continue Flomax  Dyslipidemia Continue statin  Type 2 diabetes mellitus with peripheral neuropathy Home regimen appears to be Tresiba 30 units daily, Novolog 15 units BID AC, metformin 1000 mg daily Sugars are  borderline. --Sliding scale NovoLog --Resume basal when needed - Hold metformin - Continue Neurontin  Essential hypertension Home amlodipine and metoprolol are on hold. BP's overall controlled, soft at times (109/54). Continue holding for now.        Subjective: Pt seen this AM with his son at bedside.  He is feeling quite a bit better today.  More alert and interactive, talkative.  He is not interesting in SNF if it were recommended.  Son in agreement with [facility] plan for d/c.  Pt reports ongoing cough but getting better.  No other acute complaints.   Physical Exam: Vitals:   05/22/22 1500 05/22/22 2017 05/23/22 0355 05/23/22 0917  BP: (!) 132/59 133/63 130/69 (!) 144/62  Pulse: 84 83 96 91  Resp: 18 16 16 18   Temp: 98.1 F (36.7 C) 98.4 F (36.9 C) 98.5 F (36.9 C) 98.6 F (37 C)  TempSrc:      SpO2: 95% 96% 97% 99%  Weight:      Height:       General exam: awake, more alert and interactive today, no acute distress HEENT: moist mucus membranes, hearing grossly normal  Respiratory system: faint rhonchi (improved), no expiratory wheezes, normal respiratory effort at rest, on room air Cardiovascular system: normal S1/S2, RRR, no JVD, murmurs, rubs, gallops, no pedal edema.   Gastrointestinal system: soft, NT, ND, no HSM felt, +bowel sounds. Central nervous system: A&O x2+ (self, hospital). no gross focal neurologic deficits, normal speech Extremities: moves all, no edema, normal tone Skin: dry, intact, normal temperature Psychiatry: normal mood, flat affect   Data Reviewed:  Notable labs --- Cr 1.30>>1.48, Ca 8.1, GFR 46.  Procal elevated 1.47>>1.64.  Hbg 8.8>> 10.1  Micro --  MRSA screen negative. Blood cultures - negative at < 24 hrs Sputum culture - pending  Family Communication: sister and son at bedside 4/4. Son present today 4/5.  Disposition: Status is: Inpatient Remains inpatient appropriate because: remains on IV antibiotics pending cultures and further  clinical improvement.   Planned Discharge Destination: Home health     Time spent: 36 minutes  Author: Pennie Banter, DO 05/23/2022 2:40 PM  For on call review [email or web address].

## 2022-05-23 NOTE — Progress Notes (Signed)
PHARMACY - PHYSICIAN COMMUNICATION CRITICAL VALUE ALERT - BLOOD CULTURE IDENTIFICATION (BCID)  Results for orders placed or performed during the hospital encounter of 05/21/22  Resp panel by RT-PCR (RSV, Flu A&B, Covid) Anterior Nasal Swab     Status: None   Collection Time: 05/21/22  9:19 PM   Specimen: Anterior Nasal Swab  Result Value Ref Range Status   SARS Coronavirus 2 by RT PCR NEGATIVE NEGATIVE Final    Comment: (NOTE) SARS-CoV-2 target nucleic acids are NOT DETECTED.  The SARS-CoV-2 RNA is generally detectable in upper respiratory specimens during the acute phase of infection. The lowest concentration of SARS-CoV-2 viral copies this assay can detect is 138 copies/mL. A negative result does not preclude SARS-Cov-2 infection and should not be used as the sole basis for treatment or other patient management decisions. A negative result may occur with  improper specimen collection/handling, submission of specimen other than nasopharyngeal swab, presence of viral mutation(s) within the areas targeted by this assay, and inadequate number of viral copies(<138 copies/mL). A negative result must be combined with clinical observations, patient history, and epidemiological information. The expected result is Negative.  Fact Sheet for Patients:  BloggerCourse.com  Fact Sheet for Healthcare Providers:  SeriousBroker.it  This test is no t yet approved or cleared by the [place] FDA and  has been authorized for detection and/or diagnosis of SARS-CoV-2 by FDA under an Emergency Use Authorization (EUA). This EUA will remain  in effect (meaning this test can be used) for the duration of the COVID-19 declaration under Section 564(b)(1) of the Act, 21 U.S.C.section 360bbb-3(b)(1), unless the authorization is terminated  or revoked sooner.       Influenza A by PCR NEGATIVE NEGATIVE Final   Influenza B by PCR NEGATIVE NEGATIVE Final     Comment: (NOTE) The Xpert Xpress SARS-CoV-2/FLU/RSV plus assay is intended as an aid in the diagnosis of influenza from Nasopharyngeal swab specimens and should not be used as a sole basis for treatment. Nasal washings and aspirates are unacceptable for Xpert Xpress SARS-CoV-2/FLU/RSV testing.  Fact Sheet for Patients: BloggerCourse.com  Fact Sheet for Healthcare Providers: SeriousBroker.it  This test is not yet approved or cleared by the [place] FDA and has been authorized for detection and/or diagnosis of SARS-CoV-2 by FDA under an Emergency Use Authorization (EUA). This EUA will remain in effect (meaning this test can be used) for the duration of the COVID-19 declaration under Section 564(b)(1) of the Act, 21 U.S.C. section 360bbb-3(b)(1), unless the authorization is terminated or revoked.     Resp Syncytial Virus by PCR NEGATIVE NEGATIVE Final    Comment: (NOTE) Fact Sheet for Patients: BloggerCourse.com  Fact Sheet for Healthcare Providers: SeriousBroker.it  This test is not yet approved or cleared by the [place] FDA and has been authorized for detection and/or diagnosis of SARS-CoV-2 by FDA under an Emergency Use Authorization (EUA). This EUA will remain in effect (meaning this test can be used) for the duration of the COVID-19 declaration under Section 564(b)(1) of the Act, 21 U.S.C. section 360bbb-3(b)(1), unless the authorization is terminated or revoked.  Performed at [facility], [address] [place], [place] [address]   Blood Culture (routine x 2)     Status: None (Preliminary result)   Collection Time: 05/21/22  9:19 PM   Specimen: BLOOD  Result Value Ref Range Status   Specimen Description BLOOD BLOOD RIGHT ARM  Final   Special Requests   Final    BOTTLES DRAWN AEROBIC AND  ANAEROBIC Blood Culture results may not be optimal due to  an excessive volume of blood received in culture bottles   Culture  Setup Time   Final    GRAM POSITIVE RODS ANAEROBIC BOTTLE ONLY CRITICAL RESULT CALLED TO, READ BACK BY AND VERIFIED WITH: Nicolemarie Wooley AT 0459 05/23/22.PMF Performed at [facility], [address] [place], [place] [address]    Culture GRAM POSITIVE RODS  Final   Report Status PENDING  Incomplete  Blood Culture (routine x 2)     Status: None (Preliminary result)   Collection Time: 05/21/22 11:00 PM   Specimen: BLOOD  Result Value Ref Range Status   Specimen Description BLOOD RIGHT ASSIST CONTROL  Final   Special Requests   Final    BOTTLES DRAWN AEROBIC AND ANAEROBIC Blood Culture adequate volume   Culture   Final    NO GROWTH < 24 HOURS Performed at [facility], [address], [place], [place] [address]    Report Status PENDING  Incomplete  MRSA Next Gen by PCR, Nasal     Status: None   Collection Time: 05/22/22 10:09 AM   Specimen: Nasal Mucosa; Nasal Swab  Result Value Ref Range Status   MRSA by PCR Next Gen NOT DETECTED NOT DETECTED Final    Comment: (NOTE) The GeneXpert MRSA Assay (FDA approved for NASAL specimens only), is one component of a comprehensive MRSA colonization surveillance program. It is not intended to diagnose MRSA infection nor to guide or monitor treatment for MRSA infections. Test performance is not FDA approved in patients less than 75 years old. Performed at [facility], [address], [place], [place] [address]     BCID Results: 1 (anaerobic) of 4 bottles w/ GPR.  Pt currently on Azithromycin & Ceftriaxone.  Name of provider contacted: N/A   Changes to prescribed antibiotics required: Provider not notified at this time pending additional cx results.  Gregory Tran, PharmD, [facility] 05/23/2022 5:28 AM

## 2022-05-23 NOTE — TOC Initial Note (Addendum)
Transition of Care [facility]) - Initial/Assessment Note    Patient Details  Name: Gregory Tran MRN: [id number] Date of Birth: [birth year]  Transition of Care [facility]) CM/SW Contact:    Chapman Fitch, RN Phone Number: 05/23/2022, 3:47 PM  Clinical Narrative:                 Patient A&O x2.  VM left for son wayne to discuss discharge Per chart review Admitted for: sepsis Admitted from: Home,  son is currently staying with patient  PCP: [facility]er Current home health/prior home health/DME: Rollator, cane, shower seat     Therapy recommending HH.  Will discuss with son when call returned    Patient Goals and CMS Choice            Expected Discharge Plan and Services                                              Prior Living Arrangements/Services                       Activities of Daily Living Home Assistive Devices/Equipment: Environmental consultant (specify type) (rollator) ADL Screening (condition at time of admission) Patient's cognitive ability adequate to safely complete daily activities?: No Is the patient deaf or have difficulty hearing?: No Does the patient have difficulty seeing, even when wearing glasses/contacts?: No Does the patient have difficulty concentrating, remembering, or making decisions?: Yes Patient able to express need for assistance with ADLs?: Yes Does the patient have difficulty dressing or bathing?: Yes Independently performs ADLs?: No Communication: Independent Dressing (OT): Needs assistance Is this a change from baseline?: Pre-admission baseline Grooming: Independent Feeding: Independent Bathing: Independent Toileting: Independent In/Out Bed: Independent Walks in Home: Independent, Independent with device (comment) (rollator) Does the patient have difficulty walking or climbing stairs?: Yes Weakness of Legs: Both Weakness of Arms/Hands: Both  Permission Sought/Granted                  Emotional Assessment               Admission diagnosis:  Sepsis due to pneumonia [J18.9, A41.9] Aspiration pneumonia of both lungs, unspecified aspiration pneumonia type, unspecified part of lung [J69.0] Sepsis, due to unspecified organism, unspecified whether acute organ dysfunction present [A41.9] Patient Active Problem List   Diagnosis Date Noted   Type 2 diabetes mellitus with peripheral neuropathy 05/22/2022   Dyslipidemia 05/22/2022   BPH (benign prostatic hyperplasia) 05/22/2022   GERD without esophagitis 05/22/2022   Sepsis due to pneumonia 05/21/2022   Fecal impaction 05/06/2022   Aspiration pneumonia 05/06/2022   Constipation 05/05/2022   AKI (acute kidney injury) 05/05/2022   Coffee ground emesis 05/05/2022   Weakness generalized 04/03/2022   Fall 04/03/2022   UTI (urinary tract infection) 04/03/2022   Rhabdomyolysis 04/03/2022   Other fatigue 12/15/2019   B12 deficiency 04/15/2018   Chronic anemia 04/01/2018   Essential hypertension 03/23/2017   Uncontrolled type 2 diabetes mellitus with hypoglycemia, with long-term current use of insulin 03/23/2017   Stage 3 chronic kidney disease 02/17/2017   BPH (benign prostatic hypertrophy) with urinary obstruction 07/09/2015   Benign essential hypertension 06/14/2015   Benign fibroma of prostate 05/02/2015   Coronary artery disease 05/02/2015   Chronic LBP 05/02/2015   Uncontrolled type 2 diabetes mellitus with hyperglycemia, with long-term current use of insulin 05/02/2015  Gastro-esophageal reflux disease without esophagitis 05/02/2015   HLD (hyperlipidemia) 05/02/2015   BP (high blood pressure) 05/02/2015   Anemia, iron deficiency 05/02/2015   Contracture of palmar fascia (Dupuytren's) 04/10/2015   Impingement syndrome of left shoulder 03/26/2015   Pain in shoulder 01/23/2015   BPH with obstruction/lower urinary tract symptoms 10/16/2014   Ejaculatory disorder 10/16/2014   Low back pain with sciatica 04/10/2014   PCP:  Myrene Buddy,  NP Pharmacy:   [facility] [phone] [facility] [place] - [address] AT [facility] [address] [facility] [place] [address] Phone: [phone] Fax: [phone]     Social Determinants of Health (SDOH) Social History: SDOH Screenings   Food Insecurity: No Food Insecurity (05/22/2022)  Housing: Medium Risk (05/22/2022)  Transportation Needs: No Transportation Needs (05/22/2022)  Utilities: Not At Risk (05/22/2022)  Tobacco Use: Medium Risk (05/22/2022)   SDOH Interventions:     Readmission Risk Interventions    05/23/2022    3:47 PM  Readmission Risk Prevention Plan  Transportation Screening Complete  HRI or Home Care Consult Complete  Palliative Care Screening Not Applicable  Medication Review (RN Care Manager) Complete

## 2022-05-23 NOTE — Progress Notes (Signed)
PHARMACIST - PHYSICIAN COMMUNICATION  CONCERNING: Antibiotic IV to Oral Route Change Policy  RECOMMENDATION: This patient is receiving azithromycin by the intravenous route.  Based on criteria approved by the Pharmacy and Therapeutics Committee, the antibiotic(s) is/are being converted to the equivalent oral dose form(s).  DESCRIPTION: These criteria include: Patient being treated for a respiratory tract infection, urinary tract infection, cellulitis or clostridium difficile associated diarrhea if on metronidazole The patient is not neutropenic and does not exhibit a GI malabsorption state The patient is eating (either orally or via tube) and/or has been taking other orally administered medications for a least 24 hours The patient is improving clinically and has a Tmax < 100.5  If you have questions about this conversion, please contact the Pharmacy Department   Tressie Ellis 05/23/22

## 2022-05-23 NOTE — Evaluation (Addendum)
Physical Therapy Evaluation Patient Details Name: Gregory Tran MRN: [id number] DOB: 09/27/1935 Today's Date: 05/23/2022  History of Present Illness  Gregory Tran is a 87 y.o. male with medical history significant for CAD, HTN, type 2 diabetes, chronic anemia, dyslipidemia, peripheral neuropathy, TIA and BPH, who presented to the ED on 05/21/2022 for evaluation of fever and progressively worsening cough and wheezing. Family also note confusion worse than at baseline. Pt admitted with sepsis PNA.  Clinical Impression  Pt in bed on entry, agreeable to evaluation. Son steps out of room during my visit. Pt able to come to EOB, standup, and AMB nearly 35ft with RW without significant exertion or other complaint. Transfer from standard surface is difficult but managed well. Pt reports to be near baseline, but continues to demonstrate his known baseline mobility deficits. Gait still with mild shuffling, no freezing, significantly decreased trunk movement in gait cycle. No LOB this date. Safe use of RW noted. Pt eager to continue to HHPT at DC.      Recommendations for follow up therapy are one component of a multi-disciplinary discharge planning process, led by the attending physician.  Recommendations may be updated based on patient status, additional functional criteria and insurance authorization.  Follow Up Recommendations       Assistance Recommended at Discharge Intermittent Supervision/Assistance  Patient can return home with the following  A little help with walking and/or transfers;A little help with bathing/dressing/bathroom    Equipment Recommendations None recommended by PT  Recommendations for Other Services       Functional Status Assessment Patient has had a recent decline in their functional status and demonstrates the ability to make significant improvements in function in a reasonable and predictable amount of time.     Precautions / Restrictions        Mobility   Bed Mobility Overal bed mobility: Modified Independent Bed Mobility: Supine to Sit     Supine to sit: Modified independent (Device/Increase time)          Transfers Overall transfer level: Needs assistance Equipment used: Rolling walker (2 wheels) Transfers: Sit to/from Stand Sit to Stand: Supervision           General transfer comment: quite weak, but no cues needed for prblem solving or technique    Ambulation/Gait   Gait Distance (Feet): 270 Feet Assistive device: Rolling walker (2 wheels) Gait Pattern/deviations: Shuffle Gait velocity: 0.45m/s     General Gait Details: reduced trunk movement during gait  Stairs            Wheelchair Mobility    Modified Rankin (Stroke Patients Only)       Balance Overall balance assessment: History of Falls, Modified Independent                                           Pertinent Vitals/Pain Pain Assessment Pain Assessment: No/denies pain    Home Living Family/patient expects to be discharged to:: Private residence Living Arrangements: Children (son living here on leave from work out of state to assist in setting up LTC needs) Available Help at Discharge: Family Type of Home: House Home Access: Stairs to enter   Secretary/administrator of Steps: 3   Home Layout: One level Home Equipment: Rollator (4 wheels);Cane - single point;Shower seat - built in Additional Comments: Pt's niece comes by every few days. Pt has built-in tile shower  seat, but does not use it, as tiles are slippery when wet    Prior Function Prior Level of Function : Independent/Modified Independent             Mobility Comments: Ambulates w/ walker; >6 falls in past 6 months       Hand Dominance        Extremity/Trunk Assessment                Communication      Cognition Arousal/Alertness: Awake/alert Behavior During Therapy: WFL for tasks assessed/performed Overall Cognitive Status: Within  Functional Limits for tasks assessed                                          General Comments      Exercises     Assessment/Plan    PT Assessment Patient needs continued PT services  PT Problem List Decreased strength;Decreased range of motion;Decreased activity tolerance;Decreased balance;Decreased mobility       PT Treatment Interventions DME instruction;Gait training;Stair training;Functional mobility training;Therapeutic activities;Therapeutic exercise;Patient/family education;Neuromuscular re-education    PT Goals (Current goals can be found in the Care Plan section)  Acute Rehab PT Goals Patient Stated Goal: go home be strong PT Goal Formulation: With patient Time For Goal Achievement: 06/06/22 Potential to Achieve Goals: Good    Frequency Min 2X/week     Co-evaluation               AM-PAC PT "6 Clicks" Mobility  Outcome Measure Help needed turning from your back to your side while in a flat bed without using bedrails?: None Help needed moving from lying on your back to sitting on the side of a flat bed without using bedrails?: None Help needed moving to and from a bed to a chair (including a wheelchair)?: A Little Help needed standing up from a chair using your arms (e.g., wheelchair or bedside chair)?: A Little Help needed to walk in hospital room?: A Little Help needed climbing 3-5 steps with a railing? : A Little 6 Click Score: 20    End of Session Equipment Utilized During Treatment: Gait belt Activity Tolerance: Patient tolerated treatment well;No increased pain Patient left: in bed;with nursing/sitter in room Nurse Communication: Mobility status PT Visit Diagnosis: Difficulty in walking, not elsewhere classified (R26.2)    Time: 9449-6759 PT Time Calculation (min) (ACUTE ONLY): 14 min   Charges:   PT Evaluation $PT Eval Low Complexity: 1 Low PT Treatments $Therapeutic Activity: 8-22 mins       11:41 AM, 05/23/22 Rosamaria Lints, PT, DPT Physical Therapist - [facility]  [phone] (ASCOM)   Semiah Konczal C 05/23/2022, 11:38 AM

## 2022-05-23 NOTE — Evaluation (Signed)
Occupational Therapy Evaluation Patient Details Name: Gregory Tran MRN: [id number] DOB: 1935/11/25 Today's Date: 05/23/2022   History of Present Illness Gregory Tran is a 87 y.o. male with medical history significant for CAD, HTN, type 2 diabetes, chronic anemia, dyslipidemia, peripheral neuropathy, TIA and BPH, who presented to the ED on 05/21/2022 for evaluation of fever and progressively worsening cough and wheezing. Family also note confusion worse than at baseline. Pt admitted with sepsis PNA.   Clinical Impression   Patient received for OT evaluation. See flowsheet below for details of function. Generally, patient requiring CGA for functional mobility, and set up-MOD A for ADLs. Patient will benefit from continued OT while in acute care.       Recommendations for follow up therapy are one component of a multi-disciplinary discharge planning process, led by the attending physician.  Recommendations may be updated based on patient status, additional functional criteria and insurance authorization.   Assistance Recommended at Discharge Frequent or constant Supervision/Assistance  Patient can return home with the following A little help with walking and/or transfers;A little help with bathing/dressing/bathroom;Assistance with cooking/housework;Direct supervision/assist for medications management;Direct supervision/assist for financial management;Assist for transportation;Help with stairs or ramp for entrance    Functional Status Assessment  Patient has had a recent decline in their functional status and demonstrates the ability to make significant improvements in function in a reasonable and predictable amount of time.  Equipment Recommendations  None recommended by OT    Recommendations for Other Services       Precautions / Restrictions Precautions Precautions: Fall Restrictions Weight Bearing Restrictions: No      Mobility Bed Mobility               General  bed mobility comments: Not tested; pt received seated on EOB with PT.    Transfers Overall transfer level: Needs assistance Equipment used: Rolling walker (2 wheels) Transfers: Sit to/from Stand Sit to Stand: Supervision           General transfer comment: with RW      Balance Overall balance assessment: History of Falls                                         ADL either performed or assessed with clinical judgement   ADL Overall ADL's : Needs assistance/impaired     Grooming: Wash/dry face;Oral care;Brushing hair;Min guard;Standing;Cueing for safety;Cueing for sequencing (gait belt for safety) Grooming Details (indicate cue type and reason): OT provided cues from one task to the next               Lower Body Dressing Details (indicate cue type and reason): OT asked pt to show how he would perform donning socks/shoes; pt needing to use socks to pull both LE (one at a time) up into figure four position; anticipate pt to require at least MIN A for standing balance/safety.   Toilet Transfer Details (indicate cue type and reason): anticipate pt to require CGA with RW and cues for safety   Toileting - Clothing Manipulation Details (indicate cue type and reason): anticipate pt to require supervision-MIN A for safety. When pt transferring to recliner chair, OT providing cues to reach back and pt continued to not reach back prior to sitting in recliner.   Tub/Shower Transfer Details (indicate cue type and reason): anticipate pt to require CGA for safety   General ADL Comments: Pt  likely fairly close to new baseline.     Vision         Perception     Praxis      Pertinent Vitals/Pain Pain Assessment Pain Assessment: No/denies pain     Hand Dominance Right   Extremity/Trunk Assessment Upper Extremity Assessment Upper Extremity Assessment: Overall WFL for tasks assessed   Lower Extremity Assessment Lower Extremity Assessment: Defer to PT  evaluation       Communication Communication Communication: No difficulties   Cognition Arousal/Alertness: Awake/alert Behavior During Therapy: WFL for tasks assessed/performed Overall Cognitive Status: No family/caregiver present to determine baseline cognitive functioning                                 General Comments: Pt is oriented to self and the fact that he's in the hospital. Thinks the date is Thursday (it's actually Friday) May the 19th.     General Comments  Anticipate pt to be close to baseline; cognition/safety awareness is major deficit at this time.    Exercises     Shoulder Instructions      Home Living Family/patient expects to be discharged to:: Private residence Living Arrangements: Children (son currently on work leave from out of state here assisting pt for approximately the last month; per discussion with him yesterday, goal is to find LTC for pt, however pt is resistant to this idea and son also wants to respect pt's wishes) Available Help at Discharge: Family Type of Home: House Home Access: Stairs to enter Secretary/administrator of Steps: 3   Home Layout: One level     Bathroom Shower/Tub: Producer, television/film/video: Handicapped height     Home Equipment: Rollator (4 wheels);Cane - single point;Shower seat - built in   Additional Comments: Information obtained from medical record about home set up (last admission); anticipate it to remain the same, but unverified.      Prior Functioning/Environment Prior Level of Function : Needs assist  Cognitive Assist : ADLs (cognitive)   ADLs (Cognitive):  (pt with known hx of cognitive impairment/poor decision making) Physical Assist : Mobility (physical);ADLs (physical) Mobility (physical): Gait ADLs (physical): IADLs;Bathing;Dressing Mobility Comments: Pt has had multiple falls in the past few months; uses RW per son (yesterday) ADLs Comments: Pt lives alone, but son has been  there for the past 1 month due to pt's declining status. Son yesterday stated that he has been assisting pt with ADLs such as dressing, grooming, showering, and all IADLs.        OT Problem List: Impaired balance (sitting and/or standing);Decreased cognition;Decreased safety awareness      OT Treatment/Interventions: Self-care/ADL training;Therapeutic exercise;Therapeutic activities    OT Goals(Current goals can be found in the care plan section) Acute Rehab OT Goals Patient Stated Goal: Go home OT Goal Formulation: Patient unable to participate in goal setting Time For Goal Achievement: 06/06/22 Potential to Achieve Goals: Good ADL Goals Pt Will Perform Grooming: standing;with supervision Pt Will Perform Lower Body Dressing: sit to/from stand;with supervision Pt Will Transfer to Toilet: with supervision;ambulating;regular height toilet  OT Frequency: Min 1X/week    Co-evaluation              AM-PAC OT "6 Clicks" Daily Activity     Outcome Measure Help from another person eating meals?: None Help from another person taking care of personal grooming?: A Little Help from another person toileting, which includes using toliet,  bedpan, or urinal?: A Little Help from another person bathing (including washing, rinsing, drying)?: A Little Help from another person to put on and taking off regular upper body clothing?: None Help from another person to put on and taking off regular lower body clothing?: A Little 6 Click Score: 20   End of Session Equipment Utilized During Treatment: Rolling walker (2 wheels);Gait belt Nurse Communication: Mobility status  Activity Tolerance: Patient tolerated treatment well Patient left: in chair;with call bell/phone within reach;with chair alarm set  OT Visit Diagnosis: History of falling (Z91.81)                Time: 1655-3748 OT Time Calculation (min): 21 min Charges:  OT General Charges $OT Visit: 1 Visit OT Evaluation $OT Eval Moderate  Complexity: 1 Mod OT Treatments $Self Care/Home Management : 8-22 mins  Linward Foster, MS, OTR/L  Alvester Morin 05/23/2022, 1:06 PM

## 2022-05-24 DIAGNOSIS — R7881 Bacteremia: Secondary | ICD-10-CM | POA: Diagnosis present

## 2022-05-24 DIAGNOSIS — A419 Sepsis, unspecified organism: Secondary | ICD-10-CM | POA: Diagnosis not present

## 2022-05-24 DIAGNOSIS — E1142 Type 2 diabetes mellitus with diabetic polyneuropathy: Secondary | ICD-10-CM | POA: Diagnosis not present

## 2022-05-24 DIAGNOSIS — J189 Pneumonia, unspecified organism: Secondary | ICD-10-CM | POA: Diagnosis not present

## 2022-05-24 LAB — URINE CULTURE

## 2022-05-24 LAB — BASIC METABOLIC PANEL
Anion gap: 11 (ref 5–15)
BUN: 25 mg/dL — ABNORMAL HIGH (ref 8–23)
CO2: 22 mmol/L (ref 22–32)
Calcium: 8.3 mg/dL — ABNORMAL LOW (ref 8.9–10.3)
Chloride: 107 mmol/L (ref 98–111)
Creatinine, Ser: 1.48 mg/dL — ABNORMAL HIGH (ref 0.61–1.24)
GFR, Estimated: 46 mL/min — ABNORMAL LOW (ref >60)
Glucose, Bld: 157 mg/dL — ABNORMAL HIGH (ref 70–99)
Potassium: 3.6 mmol/L (ref 3.5–5.1)
Sodium: 140 mmol/L (ref 135–145)

## 2022-05-24 LAB — GLUCOSE, CAPILLARY
Glucose-Capillary: 145 mg/dL — ABNORMAL HIGH (ref 70–99)
Glucose-Capillary: 213 mg/dL — ABNORMAL HIGH (ref 70–99)
Glucose-Capillary: 218 mg/dL — ABNORMAL HIGH (ref 70–99)
Glucose-Capillary: 168 mg/dL — ABNORMAL HIGH (ref 70–99)

## 2022-05-24 LAB — PROCALCITONIN: Procalcitonin: 1.01 ng/mL

## 2022-05-24 NOTE — Progress Notes (Signed)
Progress Note   Patient: Gregory Tran XIP:[id number] DOB: 1935/04/17 DOA: 05/21/2022     3 DOS: the patient was seen and examined on 05/24/2022   Brief hospital course: Gregory Tran is a 87 y.o. male with medical history significant for CAD, HTN, type 2 diabetes, chronic anemia, dyslipidemia, peripheral neuropathy, TIA and BPH, who presented to the ED on 05/21/2022 for evaluation of fever and progressively worsening cough and wheezing. Family also note confusion worse than at baseline.  ED Course: vitals BP 149/59., HR 95, RR 31, spO2 93% on room air.  Febrile with Tmax 102.5.   Labs were notable for glucose of 198, Cr 1.29 with otherwise unremarkable BMP.  CBC showed leukocytosis 13.3 with neutrophilia and anemia close to baseline.   Respiratory panel was negative and UA was unremarkable.  Imaging: Noncontrasted head CT with no acute findings.  Chest x-ray showed diffuse interstitial prominence with patchy bibasilar airspace disease that may reflect multifocal pneumonia or edema.   Patient was admitted to the hospital and started on empiric IV antibiotics for multifocal pneumonia.  Assessment and Plan: * Sepsis due to pneumonia Suspicious for atypical pneumonia vs aspiration. Family deny noticing signs of aspiration food or drink. Sepsis manifested by leukocytosis and fever of 102.5 and respiratory rate of 25. --Continue IV Rocephin and Zithromax - Scheduled Mucinex --Duonebs q6h while awake --PRN albuterol nebs --Follow up sputum culture --MRSA screen negative --Follow blood cultures --Flutter valve --Supplement O2 if sats < 90% on room air --SLP consulted for swallow eval    Positive blood culture Gram positive rods growing from only anaerobic bottle of admission blood cultures. Unclear significance at this time. --Requested ID's input --Repeat blood cultures --Follow initial cultures --Continue current antibiotics for PNA --Monitor fever curve, CBC and clinically for  s/sx's of infection  GERD without esophagitis Continue PPI   BPH (benign prostatic hyperplasia) Continue Flomax  Dyslipidemia Continue statin  Type 2 diabetes mellitus with peripheral neuropathy Home regimen appears to be Tresiba 30 units daily, Novolog 15 units BID AC, metformin 1000 mg daily Sugars are borderline. --Sliding scale NovoLog --Resume basal when needed - Hold metformin - Continue Neurontin  Essential hypertension Home amlodipine and metoprolol are on hold. BP's overall controlled, soft at times (109/54). Continue holding for now.        Subjective: Pt seen this AM with his son at bedside.  Pt feels much better and really wants to go home today.  He denies fever/chills.  Appetite much better.  Reports poor sleep last night.  We discussed the positive blood culture and needing to be sure he doesn't have a true blood stream infection.  He is reluctantly understanding.  Physical Exam: Vitals:   05/23/22 2000 05/23/22 2032 05/24/22 0439 05/24/22 0738  BP: (!) 128/50  (!) 126/46 119/68  Pulse: 93  78 75  Resp: 18  18 16   Temp: 98.2 F (36.8 C)  98.2 F (36.8 C) 97.8 F (36.6 C)  TempSrc: Oral  Oral   SpO2: 100% 98% 99% 99%  Weight:      Height:       General exam: awake, more alert and interactive today, no acute distress HEENT: moist mucus membranes, hearing grossly normal  Respiratory system: CTAB, no expiratory wheezes, normal respiratory effort at rest, on room air Cardiovascular system: normal S1/S2, RRR, no pedal edema.   Gastrointestinal system: soft, NT, ND Central nervous system: A&O x3. no gross focal neurologic deficits, normal speech Extremities: moves all, no edema,  normal tone Skin: dry, intact, normal temperature Psychiatry: normal mood, congruent affect   Data Reviewed:  Notable labs --- Cr stable at 1.48, Ca 8.3, GFR 46.  Procal trending down 1.47>>1.64 >> 1.01  Micro --  MRSA screen negative. Blood cultures 4/3 - anaerobic  bottle with GPR's Sputum culture - pending Repeat blood cultures 4/6 -- pending  Family Communication: sister and son at bedside 4/4. Son present 4/5, 4/6.  Disposition: Status is: Inpatient Remains inpatient appropriate because: remains on IV antibiotics pending cultures and further clinical improvement.   Planned Discharge Destination: Home health     Time spent: 36 minutes  Author: Pennie Banter, DO 05/24/2022 2:56 PM  For on call review [email or web address].
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
## 2022-05-24 NOTE — Assessment & Plan Note (Signed)
Gram positive rods growing from only anaerobic bottle of admission blood cultures. Very likely contaminant. --Requested ID's input --Repeat blood cultures - no growth --Follow initial cultures to completion --Continue antibiotics for PNA --Monitor fever curve, CBC and clinically for s/sx's of infection

## 2022-05-24 NOTE — TOC Progression Note (Signed)
Transition of Care [facility]) - Progression Note    Patient Details  Name: Gregory Tran MRN: [id number] Date of Birth: Jan 31, 1936  [facility] CM/SW Contact  Carmina Miller, LCSWA Phone Number: 05/24/2022, 3:59 PM  Clinical Narrative:     CSW attempted to reach pt's son Deniece Portela to discuss dispo, no answer, vm left.        Expected Discharge Plan and Services                                               Social Determinants of Health (SDOH) Interventions SDOH Screenings   Food Insecurity: No Food Insecurity (05/22/2022)  Housing: Medium Risk (05/22/2022)  Transportation Needs: No Transportation Needs (05/22/2022)  Utilities: Not At Risk (05/22/2022)  Tobacco Use: Medium Risk (05/22/2022)    Readmission Risk Interventions    05/23/2022    3:47 PM  Readmission Risk Prevention Plan  Transportation Screening Complete  HRI or Home Care Consult Complete  Palliative Care Screening Not Applicable  Medication Review (RN Care Manager) Complete

## 2022-05-24 NOTE — Plan of Care (Signed)

## 2022-05-25 LAB — CULTURE, BLOOD (ROUTINE X 2): Special Requests: ADEQUATE

## 2022-05-25 LAB — GLUCOSE, CAPILLARY
Glucose-Capillary: 175 mg/dL — ABNORMAL HIGH (ref 70–99)
Glucose-Capillary: 134 mg/dL — ABNORMAL HIGH (ref 70–99)

## 2022-05-25 LAB — RESPIRATORY PANEL BY PCR

## 2022-05-25 LAB — BASIC METABOLIC PANEL
Anion gap: 9 (ref 5–15)
BUN: 19 mg/dL (ref 8–23)
CO2: 23 mmol/L (ref 22–32)
Calcium: 8.4 mg/dL — ABNORMAL LOW (ref 8.9–10.3)
Chloride: 108 mmol/L (ref 98–111)
Creatinine, Ser: 1.36 mg/dL — ABNORMAL HIGH (ref 0.61–1.24)
GFR, Estimated: 51 mL/min — ABNORMAL LOW (ref >60)
Glucose, Bld: 150 mg/dL — ABNORMAL HIGH (ref 70–99)
Potassium: 3.9 mmol/L (ref 3.5–5.1)
Sodium: 140 mmol/L (ref 135–145)

## 2022-05-25 LAB — CBC
HCT: 30.3 % — ABNORMAL LOW (ref 39.0–52.0)
Hemoglobin: 9.5 g/dL — ABNORMAL LOW (ref 13.0–17.0)
MCH: 30.3 pg (ref 26.0–34.0)
MCHC: 31.4 g/dL (ref 30.0–36.0)
MCV: 96.5 fL (ref 80.0–100.0)
Platelets: 302 10*3/uL (ref 150–400)
RBC: 3.14 MIL/uL — ABNORMAL LOW (ref 4.22–5.81)
RDW: 15.9 % — ABNORMAL HIGH (ref 11.5–15.5)
WBC: 5.4 10*3/uL (ref 4.0–10.5)
nRBC: 0.4 % — ABNORMAL HIGH (ref 0.0–0.2)

## 2022-05-25 LAB — STREP PNEUMONIAE URINARY ANTIGEN: Strep Pneumo Urinary Antigen: NEGATIVE

## 2022-05-25 MED ORDER — INSULIN ASPART 100 UNIT/ML FLEXPEN: 3.0000 [IU] | PEN_INJECTOR | Freq: Three times a day (TID) | SUBCUTANEOUS | 3 refills | Status: DC

## 2022-05-25 MED ORDER — AMOXICILLIN-POT CLAVULANATE 875-125 MG PO TABS: 1.0000 | ORAL_TABLET | Freq: Two times a day (BID) | ORAL | 0 refills | Status: AC

## 2022-05-25 MED ORDER — INSULIN DEGLUDEC 100 UNIT/ML ~~LOC~~ SOPN: 10.0000 [IU] | PEN_INJECTOR | Freq: Every day | SUBCUTANEOUS | Status: DC

## 2022-05-25 MED ORDER — DIPHENHYDRAMINE HCL 25 MG PO CAPS: 25.0000 mg | ORAL_CAPSULE | Freq: Four times a day (QID) | ORAL | 0 refills | Status: DC | PRN

## 2022-05-25 MED ORDER — DM-GUAIFENESIN ER 30-600 MG PO TB12: 1.0000 | ORAL_TABLET | Freq: Two times a day (BID) | ORAL | 0 refills | Status: AC

## 2022-05-25 NOTE — Progress Notes (Signed)
Pt has been discharged this morning as he was doing well Pt's blood culture resulted as clostridium innocum 1 bottle- yesterday I had checked his back/genital area /skin and did not find any abscess, soft tissue infection , except excoriations and dry skin Clinical   significance of this bacteremia is questionable  - clinically he was so much better- and rpeeat blood culture pending He was sent on PO augmentin to treat the aspiration pneumonia- This will treat this bacteria as well.will extend for 7 days

## 2022-05-25 NOTE — Discharge Summary (Addendum)
Physician Discharge Summary   Patient: Gregory Tran MRN: [id number] DOB: June 14, 1935  Admit date:     05/21/2022  Discharge date: 05/25/22  Discharge Physician: Pennie Banter   PCP: Myrene Buddy, NP   Recommendations at discharge:    Follow up with Primary Care in 1-2 weeks Repeat CBC, BMP in 1-2 weeks Follow up on recovery from pneumonia Follow up on BP.  Home meds were held during admission and BP's were controlled with soft diastolic pressures.  Resume med/s as indicated.  Advised home BP monitoring until follow up. Follow up on blood sugars and insulin regimen.  Pt required minimal sliding scale Novolog during admission, basal insulin was held and sugars were at inpatient goal for most part.   Discharge Diagnoses: Principal Problem:   Sepsis due to pneumonia Active Problems:   Positive blood culture   Essential hypertension   Type 2 diabetes mellitus with peripheral neuropathy   Dyslipidemia   BPH (benign prostatic hyperplasia)   GERD without esophagitis  Resolved Problems:   * No resolved hospital problems. *  Hospital Course: Gregory Tran is a 87 y.o. male with medical history significant for CAD, HTN, type 2 diabetes, chronic anemia, dyslipidemia, peripheral neuropathy, TIA and BPH, who presented to the ED on 05/21/2022 for evaluation of fever and progressively worsening cough and wheezing. Family also note confusion worse than at baseline.  ED Course: vitals BP 149/59., HR 95, RR 31, spO2 93% on room air.  Febrile with Tmax 102.5.   Labs were notable for glucose of 198, Cr 1.29 with otherwise unremarkable BMP.  CBC showed leukocytosis 13.3 with neutrophilia and anemia close to baseline.   Respiratory panel was negative and UA was unremarkable.  Imaging: Noncontrasted head CT with no acute findings.  Chest x-ray showed diffuse interstitial prominence with patchy bibasilar airspace disease that may reflect multifocal pneumonia or edema.   Patient was  admitted to the hospital and started on empiric IV antibiotics for multifocal pneumonia.   Interval history - pt feels much better, but poor sleep last night.  He wants to go home today. Son was at bedside, we discussed the high likelihood the single bottle blood culture is a contaminant, no growth so far with repeat blood cultures.  Son requests to proceed with discharge today given patient has significantly improved.   Assessment and Plan: * Sepsis due to pneumonia Suspicious for atypical pneumonia vs aspiration. Family deny noticing signs of aspiration food or drink. Sepsis manifested by leukocytosis and fever of 102.5 and respiratory rate of 25. --Treated with IV Rocephin and Zithromax, scheduled Mucinex, Duonebs q6h while awake, PRN albuterol nebs --Patient has clinically improved significantly --Discharge on PO Augmentin to complete 7 day course  --Sputum culture appears not collected --MRSA screen negative --Flutter valve --Supplement O2 if sats < 90% on room air --SLP evaluated - no signs of aspiration   Positive blood culture Gram positive rods growing from only anaerobic bottle of admission blood cultures. Very likely contaminant. --Requested ID's input --Repeat blood cultures - no growth --Follow initial cultures to completion --Continue antibiotics for PNA --Monitor fever curve, CBC and clinically for s/sx's of infection  Type 2 diabetes mellitus with peripheral neuropathy Home regimen appears to be Tresiba 30 units daily, Novolog 15 units BID AC, metformin 1000 mg daily.   Pt has required much less insulin that his home regimen.  Covered only with sliding scale NovoLog, no basal coverage and sugars are overall at inpatient goal --For discharge,  resume metformin, reduce Tresiba to 10 units daily, reduce Novolog to 3 units with meals --Write down all home sugar readings and closely follow up with Primary Care --Anticipate higher sugars once home and resuming usually  dietary habits.  Essential hypertension Home amlodipine and metoprolol have been on hold to avoid hypotension.  BP's have been controlled, diastolic pressures soft.. --Hold off amlodipine and metoprolol at discharge --Monitor BP's at home 2-3 times daily --Close follow up with Primary Care --Resume med/s as BP tolerates (consistently above 130/80)  GERD without esophagitis Continue PPI   BPH (benign prostatic hyperplasia) Continue Flomax  Dyslipidemia Continue statin         Consultants: None Procedures performed: None  Disposition: Home health Diet recommendation:  Cardiac and Carb modified diet DISCHARGE MEDICATION: Allergies as of 05/25/2022       Reactions   Atorvastatin Other (See Comments)   B Complex-folic Acid Hives        Medication List     STOP taking these medications    amLODipine 10 MG tablet Commonly known as: NORVASC   metoprolol succinate 25 MG 24 hr tablet Commonly known as: TOPROL-XL       TAKE these medications    ammonium lactate 12 % cream Commonly known as: AMLACTIN Apply 1 Application topically 2 (two) times daily. (Apply to back)   amoxicillin-clavulanate 875-125 MG tablet Commonly known as: AUGMENTIN Take 1 tablet by mouth 2 (two) times daily for 4 days.   aspirin EC 81 MG tablet Take 81 mg by mouth daily.   dextromethorphan-guaiFENesin 30-600 MG 12hr tablet Commonly known as: MUCINEX DM Take 1 tablet by mouth 2 (two) times daily for 10 days.   diphenhydrAMINE 25 mg capsule Commonly known as: BENADRYL Take 1 capsule (25 mg total) by mouth every 6 (six) hours as needed for itching, sleep or allergies.   gabapentin 100 MG capsule Commonly known as: NEURONTIN Take 100 mg by mouth daily.   insulin aspart 100 UNIT/ML FlexPen Commonly known as: NOVOLOG Inject 3 Units into the skin 3 (three) times daily with meals. What changed:  how much to take when to take this   insulin degludec 100 UNIT/ML FlexTouch  Pen Commonly known as: TRESIBA Inject 10 Units into the skin daily. What changed: how much to take   metFORMIN 1000 MG tablet Commonly known as: GLUCOPHAGE Take 1,000 mg by mouth daily.   pantoprazole 40 MG tablet Commonly known as: PROTONIX Take 40 mg by mouth daily.   rosuvastatin 5 MG tablet Commonly known as: CRESTOR Take 5 mg by mouth every other day.   tamsulosin 0.4 MG Caps capsule Commonly known as: FLOMAX TAKE 1 CAPSULE(0.4 MG) BY MOUTH DAILY        Discharge Exam: Filed Weights   05/21/22 2109  Weight: 84 kg   General exam: awake, drowsy, no acute distress HEENT: moist mucus membranes, hearing grossly normal  Respiratory system: CTAB, no wheezes, rales or rhonchi, normal respiratory effort. Cardiovascular system: normal S1/S2, RRR, no pedal edema.   Gastrointestinal system: soft, NT, ND, no HSM felt, +bowel sounds. Central nervous system: A&O x2+. no gross focal neurologic deficits, normal speech Extremities: moves all, no edema, normal tone Skin: dry, intact, normal temperature Psychiatry: normal mood, congruent affect, judgement and insight appear normal   Condition at discharge: stable  The results of significant diagnostics from this hospitalization (including imaging, microbiology, ancillary and laboratory) are listed below for reference.   Imaging Studies: CT CHEST ABDOMEN PELVIS WO CONTRAST  Result Date: 05/21/2022 CLINICAL DATA:  Sepsis, fever, hyperglycemia EXAM: CT CHEST, ABDOMEN AND PELVIS WITHOUT CONTRAST TECHNIQUE: Multidetector CT imaging of the chest, abdomen and pelvis was performed following the standard protocol without IV contrast. RADIATION DOSE REDUCTION: This exam was performed according to the departmental dose-optimization program which includes automated exposure control, adjustment of the mA and/or kV according to patient size and/or use of iterative reconstruction technique. COMPARISON:  None Available. FINDINGS: CT CHEST FINDINGS  Cardiovascular: Extensive multi-vessel coronary artery calcification. Cardiac size within normal limits. No pericardial effusion. Central pulmonary arteries are of normal caliber. Extensive atherosclerotic calcification within the thoracic aorta. Mediastinum/Nodes: The visualized thyroid is unremarkable. No pathologic thoracic adenopathy. The distal esophagus appears mildly circumferentially thick walled small amount of fluid is seen layering within the mid esophagus which may reflect changes of esophagitis in the setting of gastroesophageal reflux or esophageal dysmotility. This is not well assessed on this examination. Lungs/Pleura: Reticulonodular infiltrates have progressed within the lung bases bilaterally in keeping with atypical infection or aspiration. Trace superimposed interstitial pulmonary edema and bilateral pleural effusions are present. No pneumothorax. No central obstructing lesion. Bronchial wall thickening in keeping with airway inflammation noted. Musculoskeletal: No acute bone abnormality. No lytic or blastic bone lesion. CT ABDOMEN PELVIS FINDINGS Hepatobiliary: No focal liver abnormality is seen. No gallstones, gallbladder wall thickening, or biliary dilatation. Pancreas: Unremarkable Spleen: Unremarkable Adrenals/Urinary Tract: The adrenal glands are unremarkable. The kidneys are normal in size and position. Vascular calcifications noted within the renal hila bilaterally. No definite urinary renal or ureteral calculi. The bladder is distended but is otherwise unremarkable. Stomach/Bowel: Moderate colonic stool burden without evidence of obstruction. Stomach, small bowel, and large bowel are otherwise unremarkable. Appendix normal. No free intraperitoneal gas or fluid. Vascular/Lymphatic: Extensive aortoiliac atherosclerotic calcification. Particularly prominent atherosclerotic calcification noted at the origin of the superior mesenteric artery, however, the degree of stenosis is not well  assessed on this noncontrast examination. Extensive calcification also noted within the lower extremity arterial inflow bilaterally. No aortic aneurysm. No pathologic adenopathy within the abdomen and pelvis. Reproductive: Moderate prostatic hypertrophy. Other: No abdominal wall hernia. Musculoskeletal: L4 and L5 vertebral augmentation has been performed. Osseous structures are otherwise age-appropriate. No acute bone abnormality. No lytic or blastic bone lesion. IMPRESSION: 1. Progressive reticulonodular infiltrates within the lung bases bilaterally in keeping with atypical infection or aspiration. 2. Trace superimposed interstitial pulmonary edema and bilateral pleural effusions. 3. Extensive multi-vessel coronary artery calcification. 4. Mild circumferentially thick walled distal esophagus which may reflect changes of esophagitis in the setting of gastroesophageal reflux or esophageal dysmotility. Correlation with endoscopy may be helpful for further evaluation. 5. Moderate colonic stool burden without evidence of obstruction. 6. Extensive aortoiliac atherosclerotic calcification. Particularly prominent atherosclerotic calcification at the origin of the superior mesenteric artery, however, the degree of stenosis is not well assessed on this noncontrast examination. If there is clinical evidence of chronic mesenteric ischemia, CT arteriography may be helpful for further evaluation. 7. Moderate prostatic hypertrophy. Aortic Atherosclerosis (ICD10-I70.0). Electronically Signed   By: Helyn Numbers M.D.   On: 05/21/2022 23:00   CT HEAD WO CONTRAST ( )  Result Date: 05/21/2022 CLINICAL DATA:  Headache. EXAM: CT HEAD WITHOUT CONTRAST TECHNIQUE: Contiguous axial images were obtained from the base of the skull through the vertex without intravenous contrast. RADIATION DOSE REDUCTION: This exam was performed according to the departmental dose-optimization program which includes automated exposure control, adjustment  of the mA and/or kV according to patient size and/or use of iterative reconstruction  technique. COMPARISON:  None Available. FINDINGS: Brain: There is periventricular white matter decreased attenuation consistent with small vessel ischemic changes. Ventricles, sulci and cisterns are prominent consistent with age related involutional changes. No acute intracranial hemorrhage, mass effect or shift. No hydrocephalus. Vascular: No hyperdense vessel or unexpected calcification. Skull: Normal. Negative for fracture or focal lesion. Sinuses/Orbits: No acute finding. IMPRESSION: Atrophy and chronic small vessel ischemic changes. No acute intracranial process identified. Electronically Signed   By: Layla Maw M.D.   On: 05/21/2022 22:08   DG Chest Port 1 View  Result Date: 05/21/2022 CLINICAL DATA:  Sepsis, hyperglycemia, febrile EXAM: PORTABLE CHEST 1 VIEW COMPARISON:  04/03/2022 FINDINGS: Single frontal view of the chest demonstrates an unremarkable cardiac silhouette. There is diffuse increased interstitial prominence, with patchy bibasilar airspace disease. No effusion or pneumothorax. No acute bony abnormalities. IMPRESSION: 1. Diffuse interstitial prominence with patchy bibasilar airspace disease, which may reflect multifocal pneumonia or edema. Electronically Signed   By: Sharlet Salina M.D.   On: 05/21/2022 21:40   CT Abdomen Pelvis W Contrast  Result Date: 05/05/2022 CLINICAL DATA:  Concern for bowel obstruction. EXAM: CT ABDOMEN AND PELVIS WITH CONTRAST TECHNIQUE: Multidetector CT imaging of the abdomen and pelvis was performed using the standard protocol following bolus administration of intravenous contrast. RADIATION DOSE REDUCTION: This exam was performed according to the departmental dose-optimization program which includes automated exposure control, adjustment of the mA and/or kV according to patient size and/or use of iterative reconstruction technique. CONTRAST:  11mL OMNIPAQUE IOHEXOL 300  MG/ML  SOLN COMPARISON:  CT abdomen pelvis dated 11/11/2012. FINDINGS: Lower chest: Scattered nodular densities throughout the visualized lung bases most consistent with atypical pneumonia versus aspiration. Clinical correlation is recommended. There is coronary vascular calcification. No intra-abdominal free air or free fluid. Hepatobiliary: The liver is unremarkable. No biliary dilatation. The gallbladder is unremarkable. Pancreas: Scattered calcification of the uncinate process of the pancreas, likely sequela of chronic pancreatitis. No active inflammatory changes. No dilatation of the main pancreatic duct or gland atrophy. Spleen: Normal in size without focal abnormality. Adrenals/Urinary Tract: The adrenal glands unremarkable. Mild bilateral renal parenchyma atrophy. There is no hydronephrosis on the side. There is symmetric enhancement and excretion of contrast by both kidneys. There is renal vascular calcification. The visualized ureters and delayed bladder appear unremarkable. Stomach/Bowel: There is a small hiatal hernia. There is sigmoid diverticulosis and scattered colonic diverticula without active inflammatory changes. There is moderate stool in the distal colon. There is no bowel obstruction. The appendix is normal. Vascular/Lymphatic: Advanced aortoiliac atherosclerotic disease. The IVC is unremarkable. No portal venous gas. There is no adenopathy. Reproductive: The prostate and seminal vesicles are grossly unremarkable. No pelvic mass. Other: None Musculoskeletal: Osteopenia with degenerative changes of the spine. L4 and L5 vertebroplasty. No acute osseous pathology. IMPRESSION: 1. No acute intra-abdominal or pelvic pathology. No bowel obstruction. Normal appendix. 2. Colonic diverticulosis. 3. Scattered nodular densities throughout the visualized lung bases most consistent with atypical pneumonia versus aspiration. 4.  Aortic Atherosclerosis (ICD10-I70.0). Electronically Signed   By: Elgie Collard M.D.   On: 05/05/2022 21:45    Microbiology: Results for orders placed or performed during the hospital encounter of 05/21/22  Resp panel by RT-PCR (RSV, Flu A&B, Covid) Anterior Nasal Swab     Status: None   Collection Time: 05/21/22  9:19 PM   Specimen: Anterior Nasal Swab  Result Value Ref Range Status   SARS Coronavirus 2 by RT PCR NEGATIVE NEGATIVE Final    Comment: (  NOTE) SARS-CoV-2 target nucleic acids are NOT DETECTED.  The SARS-CoV-2 RNA is generally detectable in upper respiratory specimens during the acute phase of infection. The lowest concentration of SARS-CoV-2 viral copies this assay can detect is 138 copies/mL. A negative result does not preclude SARS-Cov-2 infection and should not be used as the sole basis for treatment or other patient management decisions. A negative result may occur with  improper specimen collection/handling, submission of specimen other than nasopharyngeal swab, presence of viral mutation(s) within the areas targeted by this assay, and inadequate number of viral copies(<138 copies/mL). A negative result must be combined with clinical observations, patient history, and epidemiological information. The expected result is Negative.  Fact Sheet for Patients:  BloggerCourse.com  Fact Sheet for Healthcare Providers:  SeriousBroker.it  This test is no t yet approved or cleared by the [place] FDA and  has been authorized for detection and/or diagnosis of SARS-CoV-2 by FDA under an Emergency Use Authorization (EUA). This EUA will remain  in effect (meaning this test can be used) for the duration of the COVID-19 declaration under Section 564(b)(1) of the Act, 21 U.S.C.section 360bbb-3(b)(1), unless the authorization is terminated  or revoked sooner.       Influenza A by PCR NEGATIVE NEGATIVE Final   Influenza B by PCR NEGATIVE NEGATIVE Final    Comment: (NOTE) The Xpert Xpress  SARS-CoV-2/FLU/RSV plus assay is intended as an aid in the diagnosis of influenza from Nasopharyngeal swab specimens and should not be used as a sole basis for treatment. Nasal washings and aspirates are unacceptable for Xpert Xpress SARS-CoV-2/FLU/RSV testing.  Fact Sheet for Patients: BloggerCourse.com  Fact Sheet for Healthcare Providers: SeriousBroker.it  This test is not yet approved or cleared by the [place] FDA and has been authorized for detection and/or diagnosis of SARS-CoV-2 by FDA under an Emergency Use Authorization (EUA). This EUA will remain in effect (meaning this test can be used) for the duration of the COVID-19 declaration under Section 564(b)(1) of the Act, 21 U.S.C. section 360bbb-3(b)(1), unless the authorization is terminated or revoked.     Resp Syncytial Virus by PCR NEGATIVE NEGATIVE Final    Comment: (NOTE) Fact Sheet for Patients: BloggerCourse.com  Fact Sheet for Healthcare Providers: SeriousBroker.it  This test is not yet approved or cleared by the [place] FDA and has been authorized for detection and/or diagnosis of SARS-CoV-2 by FDA under an Emergency Use Authorization (EUA). This EUA will remain in effect (meaning this test can be used) for the duration of the COVID-19 declaration under Section 564(b)(1) of the Act, 21 U.S.C. section 360bbb-3(b)(1), unless the authorization is terminated or revoked.  Performed at [facility], [address], [place], [place] [address]   Blood Culture (routine x 2)     Status: None (Preliminary result)   Collection Time: 05/21/22  9:19 PM   Specimen: BLOOD  Result Value Ref Range Status   Specimen Description   Final    BLOOD BLOOD RIGHT ARM Performed at [facility], [address] [place], [place] [address]    Special Requests   Final    BOTTLES DRAWN AEROBIC AND  ANAEROBIC Blood Culture results may not be optimal due to an excessive volume of blood received in culture bottles Performed at [facility], [address], [place], [place] [address]    Culture  Setup Time   Final    GRAM POSITIVE RODS ANAEROBIC BOTTLE ONLY CRITICAL RESULT CALLED TO, READ BACK BY AND VERIFIED WITH:  NATHAN BELUE AT 0459 05/23/22.PMF Performed at [facility], [address] [place], [place] [address]    Culture   Final    Romie Minus POSITIVE RODS CULTURE REINCUBATED FOR BETTER GROWTH Performed at [facility], [address] [place], [place] [address]    Report Status PENDING  Incomplete  Urine Culture (for pregnant, neutropenic or urologic patients or patients with an indwelling urinary catheter)     Status: Abnormal   Collection Time: 05/21/22 10:18 PM   Specimen: Urine, Random  Result Value Ref Range Status   Specimen Description   Final    URINE, RANDOM Performed at [facility], [address] [place], [place] [address]    Special Requests   Final    NONE Performed at [facility], [address] [place], [place] [address]    Culture MULTIPLE SPECIES PRESENT, SUGGEST RECOLLECTION (A)  Final   Report Status 05/24/2022 FINAL  Final  Blood Culture (routine x 2)     Status: None (Preliminary result)   Collection Time: 05/21/22 11:00 PM   Specimen: BLOOD  Result Value Ref Range Status   Specimen Description BLOOD RIGHT ASSIST CONTROL  Final   Special Requests   Final    BOTTLES DRAWN AEROBIC AND ANAEROBIC Blood Culture adequate volume   Culture   Final    NO GROWTH 4 DAYS Performed at [facility], [address], [place], [place] [address]    Report Status PENDING  Incomplete  MRSA Next Gen by PCR, Nasal     Status: None   Collection Time: 05/22/22 10:09 AM   Specimen: Nasal Mucosa; Nasal Swab  Result Value Ref Range Status   MRSA by PCR Next Gen NOT DETECTED NOT DETECTED Final    Comment: (NOTE) The  GeneXpert MRSA Assay (FDA approved for NASAL specimens only), is one component of a comprehensive MRSA colonization surveillance program. It is not intended to diagnose MRSA infection nor to guide or monitor treatment for MRSA infections. Test performance is not FDA approved in patients less than 74 years old. Performed at [facility], [address] [place], [place] [address]   Culture, blood (Routine X 2) w Reflex to ID Panel     Status: None (Preliminary result)   Collection Time: 05/24/22  1:04 PM   Specimen: BLOOD  Result Value Ref Range Status   Specimen Description BLOOD LEFT ANTECUBITAL  Final   Special Requests   Final    BOTTLES DRAWN AEROBIC AND ANAEROBIC Blood Culture adequate volume   Culture   Final    NO GROWTH < 24 HOURS Performed at [facility], [address] [place], [place] [address]    Report Status PENDING  Incomplete  Culture, blood (Routine X 2) w Reflex to ID Panel     Status: None (Preliminary result)   Collection Time: 05/24/22  1:06 PM   Specimen: BLOOD  Result Value Ref Range Status   Specimen Description BLOOD BLOOD LEFT HAND  Final   Special Requests   Final    BOTTLES DRAWN AEROBIC AND ANAEROBIC Blood Culture adequate volume   Culture   Final    NO GROWTH < 24 HOURS Performed at [facility], [address] [place], [place] [address]    Report Status PENDING  Incomplete  Respiratory (~20 pathogens) panel by PCR     Status: None   Collection Time: 05/24/22  6:28 PM   Specimen: Nasopharyngeal Swab; Respiratory  Result Value  Ref Range Status   Adenovirus NOT DETECTED NOT DETECTED Final   Coronavirus 229E NOT DETECTED NOT DETECTED Final    Comment: (NOTE) The Coronavirus on the Respiratory Panel, DOES NOT test for the novel  Coronavirus (2019 nCoV)    Coronavirus HKU1 NOT DETECTED NOT DETECTED Final   Coronavirus NL63 NOT DETECTED NOT DETECTED Final   Coronavirus OC43 NOT DETECTED NOT DETECTED Final    Metapneumovirus NOT DETECTED NOT DETECTED Final   Rhinovirus / Enterovirus NOT DETECTED NOT DETECTED Final   Influenza A NOT DETECTED NOT DETECTED Final   Influenza B NOT DETECTED NOT DETECTED Final   Parainfluenza Virus 1 NOT DETECTED NOT DETECTED Final   Parainfluenza Virus 2 NOT DETECTED NOT DETECTED Final   Parainfluenza Virus 3 NOT DETECTED NOT DETECTED Final   Parainfluenza Virus 4 NOT DETECTED NOT DETECTED Final   Respiratory Syncytial Virus NOT DETECTED NOT DETECTED Final   Bordetella pertussis NOT DETECTED NOT DETECTED Final   Bordetella Parapertussis NOT DETECTED NOT DETECTED Final   Chlamydophila pneumoniae NOT DETECTED NOT DETECTED Final   Mycoplasma pneumoniae NOT DETECTED NOT DETECTED Final    Comment: Performed at [facility], [address] [place], [place] [address]    Labs: CBC: Recent Labs  Lab 05/21/22 2119 05/22/22 0729 05/23/22 0531 05/25/22 0508  WBC 13.3* 9.5 9.9 5.4  NEUTROABS 11.7*  --   --   --   HGB 9.5* 8.8* 10.1* 9.5*  HCT 30.1* 27.8* 31.5* 30.3*  MCV 97.7 96.9 96.3 96.5  PLT 317 293 289 302   Basic Metabolic Panel: Recent Labs  Lab 05/21/22 2119 05/22/22 0729 05/23/22 0531 05/24/22 0514 05/25/22 0508  NA 137 139 138 140 140  K 4.3 3.8 3.9 3.6 3.9  CL 106 108 108 107 108  CO2 23 22 19* 22 23  GLUCOSE 198* 91 110* 157* 150*  BUN 23 21 24* 25* 19  CREATININE 1.29* 1.30* 1.48* 1.48* 1.36*  CALCIUM 8.9 8.4* 8.1* 8.3* 8.4*  MG  --   --  1.8  --   --    Liver Function Tests: Recent Labs  Lab 05/21/22 2119  AST 24  ALT 21  ALKPHOS 49  BILITOT 0.8  PROT 7.2  ALBUMIN 3.7   CBG: Recent Labs  Lab 05/24/22 1226 05/24/22 1709 05/24/22 2114 05/25/22 0737 05/25/22 1222  GLUCAP 213* 218* 168* 175* 134*    Discharge time spent: greater than 30 minutes.  Signed: Pennie Banter, DO [facility] 05/25/2022

## 2022-05-25 NOTE — TOC Transition Note (Signed)
Transition of Care [facility] - CM/SW Discharge Note   Patient Details  Name: Gregory Tran MRN: [id number] Date of Birth: [birth year]  [facility] CM/SW Contact:  Bridgett Larsson, LCSW Phone Number: 05/25/2022, 11:18 AM   Clinical Narrative:    CSW spoke with pt's son, Deniece Portela. CSW introduced self and discussed recommendation for [facility] PT. Mr. Deniece Portela shared that pt is active with [facility]. CSW confirmed with Barbara Cower at 11:15 AM that pt receives [facility] PT, RN, and HHA. Informed him of expected discharge. CSW requested attending provider to update order, to include RN and HHA  Patient's son will continue staying with patient and provide transportation at d/c. No barriers to obtaining medications.   Final next level of care: Home w Home Health Services Barriers to Discharge: Barriers Resolved   Patient Goals and CMS Choice      Discharge Placement                         Discharge Plan and Services Additional resources added to the After Visit Summary for                            [facility] Arranged: PT (Patient is active with [facility] for [facility] RN, PT, and HHA) HH Agency: [facility] ([facility]) Date HH Agency Contacted: 05/25/22 Time HH Agency Contacted: 1115 Representative spoke with at [facility] Agency: Barbara Cower  Social Determinants of Health (SDOH) Interventions SDOH Screenings   Food Insecurity: No Food Insecurity (05/22/2022)  Housing: Medium Risk (05/22/2022)  Transportation Needs: No Transportation Needs (05/22/2022)  Utilities: Not At Risk (05/22/2022)  Tobacco Use: Medium Risk (05/22/2022)     Readmission Risk Interventions    05/23/2022    3:47 PM  Readmission Risk Prevention Plan  Transportation Screening Complete  HRI or Home Care Consult Complete  Palliative Care Screening Not Applicable  Medication Review (RN Care Manager) Complete

## 2022-05-26 LAB — CULTURE, BLOOD (ROUTINE X 2)
Culture: NO GROWTH
Culture: NO GROWTH
Special Requests: ADEQUATE

## 2022-05-26 LAB — LEGIONELLA PNEUMOPHILA SEROGP 1 UR AG: L. pneumophila Serogp 1 Ur Ag: NEGATIVE

## 2022-05-27 ENCOUNTER — Other Ambulatory Visit: Payer: Self-pay | Admitting: Internal Medicine

## 2022-05-27 LAB — CULTURE, BLOOD (ROUTINE X 2)

## 2022-05-27 MED ORDER — AMOXICILLIN-POT CLAVULANATE 875-125 MG PO TABS: 1.0000 | ORAL_TABLET | Freq: Two times a day (BID) | ORAL | 0 refills | Status: AC

## 2022-05-27 NOTE — Progress Notes (Signed)
Extending Augmentin to total 7 day course, per ID, see their note re. Positive blood culture of unclear clinical significance, but want to treat appropriately.  Sending additional 3 days to pharmacy, was discharged with 4 days.  Son Deniece Portela was updated by phone this afternoon.

## 2022-05-29 LAB — CULTURE, BLOOD (ROUTINE X 2)
Culture: NO GROWTH
Special Requests: ADEQUATE

## 2022-06-12 ENCOUNTER — Emergency Department: Payer: Medicare Other

## 2022-06-12 ENCOUNTER — Inpatient Hospital Stay
Admission: EM | Admit: 2022-06-12 | Discharge: 2022-06-19 | DRG: 291 | Disposition: A | Discharge status: Home Health | Payer: Medicare Other | Attending: Internal Medicine | Admitting: Internal Medicine

## 2022-06-12 DIAGNOSIS — Z79899 Other long term (current) drug therapy: Secondary | ICD-10-CM

## 2022-06-12 DIAGNOSIS — Z794 Long term (current) use of insulin: Secondary | ICD-10-CM

## 2022-06-12 DIAGNOSIS — R0609 Other forms of dyspnea: Secondary | ICD-10-CM

## 2022-06-12 DIAGNOSIS — Z841 Family history of disorders of kidney and ureter: Secondary | ICD-10-CM | POA: Diagnosis not present

## 2022-06-12 DIAGNOSIS — J9601 Acute respiratory failure with hypoxia: Secondary | ICD-10-CM | POA: Diagnosis present

## 2022-06-12 DIAGNOSIS — I2510 Atherosclerotic heart disease of native coronary artery without angina pectoris: Secondary | ICD-10-CM | POA: Diagnosis not present

## 2022-06-12 DIAGNOSIS — Z8673 Personal history of transient ischemic attack (TIA), and cerebral infarction without residual deficits: Secondary | ICD-10-CM | POA: Diagnosis not present

## 2022-06-12 DIAGNOSIS — E1142 Type 2 diabetes mellitus with diabetic polyneuropathy: Secondary | ICD-10-CM | POA: Diagnosis present

## 2022-06-12 DIAGNOSIS — R0989 Other specified symptoms and signs involving the circulatory and respiratory systems: Principal | ICD-10-CM

## 2022-06-12 DIAGNOSIS — I252 Old myocardial infarction: Secondary | ICD-10-CM

## 2022-06-12 DIAGNOSIS — Z955 Presence of coronary angioplasty implant and graft: Secondary | ICD-10-CM | POA: Diagnosis not present

## 2022-06-12 DIAGNOSIS — D649 Anemia, unspecified: Secondary | ICD-10-CM | POA: Diagnosis not present

## 2022-06-12 DIAGNOSIS — I509 Heart failure, unspecified: Secondary | ICD-10-CM | POA: Diagnosis present

## 2022-06-12 DIAGNOSIS — R591 Generalized enlarged lymph nodes: Secondary | ICD-10-CM | POA: Diagnosis present

## 2022-06-12 DIAGNOSIS — E785 Hyperlipidemia, unspecified: Secondary | ICD-10-CM | POA: Diagnosis present

## 2022-06-12 DIAGNOSIS — Z7982 Long term (current) use of aspirin: Secondary | ICD-10-CM

## 2022-06-12 DIAGNOSIS — I5021 Acute systolic (congestive) heart failure: Secondary | ICD-10-CM | POA: Diagnosis not present

## 2022-06-12 DIAGNOSIS — E1165 Type 2 diabetes mellitus with hyperglycemia: Secondary | ICD-10-CM | POA: Diagnosis present

## 2022-06-12 DIAGNOSIS — N1831 Chronic kidney disease, stage 3a: Secondary | ICD-10-CM | POA: Diagnosis not present

## 2022-06-12 DIAGNOSIS — K219 Gastro-esophageal reflux disease without esophagitis: Secondary | ICD-10-CM | POA: Diagnosis present

## 2022-06-12 DIAGNOSIS — J9602 Acute respiratory failure with hypercapnia: Secondary | ICD-10-CM | POA: Diagnosis present

## 2022-06-12 DIAGNOSIS — Z87891 Personal history of nicotine dependence: Secondary | ICD-10-CM | POA: Diagnosis not present

## 2022-06-12 DIAGNOSIS — R54 Age-related physical debility: Secondary | ICD-10-CM | POA: Diagnosis present

## 2022-06-12 DIAGNOSIS — I5023 Acute on chronic systolic (congestive) heart failure: Secondary | ICD-10-CM | POA: Diagnosis present

## 2022-06-12 DIAGNOSIS — E1122 Type 2 diabetes mellitus with diabetic chronic kidney disease: Secondary | ICD-10-CM | POA: Diagnosis present

## 2022-06-12 DIAGNOSIS — N400 Enlarged prostate without lower urinary tract symptoms: Secondary | ICD-10-CM | POA: Diagnosis present

## 2022-06-12 DIAGNOSIS — Z7984 Long term (current) use of oral hypoglycemic drugs: Secondary | ICD-10-CM | POA: Diagnosis not present

## 2022-06-12 DIAGNOSIS — K5900 Constipation, unspecified: Secondary | ICD-10-CM | POA: Diagnosis present

## 2022-06-12 DIAGNOSIS — Z9079 Acquired absence of other genital organ(s): Secondary | ICD-10-CM

## 2022-06-12 DIAGNOSIS — D509 Iron deficiency anemia, unspecified: Secondary | ICD-10-CM | POA: Diagnosis present

## 2022-06-12 DIAGNOSIS — N179 Acute kidney failure, unspecified: Secondary | ICD-10-CM | POA: Diagnosis present

## 2022-06-12 DIAGNOSIS — I130 Hypertensive heart and chronic kidney disease with heart failure and stage 1 through stage 4 chronic kidney disease, or unspecified chronic kidney disease: Principal | ICD-10-CM | POA: Diagnosis present

## 2022-06-12 DIAGNOSIS — Z1152 Encounter for screening for COVID-19: Secondary | ICD-10-CM | POA: Diagnosis not present

## 2022-06-12 DIAGNOSIS — N183 Chronic kidney disease, stage 3 unspecified: Secondary | ICD-10-CM | POA: Diagnosis present

## 2022-06-12 LAB — CBC
HCT: 28.9 % — ABNORMAL LOW (ref 39.0–52.0)
Hemoglobin: 8.6 g/dL — ABNORMAL LOW (ref 13.0–17.0)
MCH: 28.7 pg (ref 26.0–34.0)
MCHC: 29.8 g/dL — ABNORMAL LOW (ref 30.0–36.0)
MCV: 96.3 fL (ref 80.0–100.0)
Platelets: 362 10*3/uL (ref 150–400)
RBC: 3 MIL/uL — ABNORMAL LOW (ref 4.22–5.81)
RDW: 15.9 % — ABNORMAL HIGH (ref 11.5–15.5)
WBC: 7.4 10*3/uL (ref 4.0–10.5)
nRBC: 0 % (ref 0.0–0.2)

## 2022-06-12 LAB — GLUCOSE, CAPILLARY: Glucose-Capillary: 92 mg/dL (ref 70–99)

## 2022-06-12 LAB — LACTIC ACID, PLASMA: Lactic Acid, Venous: 1.8 mmol/L (ref 0.5–1.9)

## 2022-06-12 LAB — TROPONIN I (HIGH SENSITIVITY)
Troponin I (High Sensitivity): 61 ng/L — ABNORMAL HIGH (ref <18)
Troponin I (High Sensitivity): 60 ng/L — ABNORMAL HIGH (ref <18)

## 2022-06-12 LAB — BASIC METABOLIC PANEL
Anion gap: 8 (ref 5–15)
BUN: 18 mg/dL (ref 8–23)
CO2: 23 mmol/L (ref 22–32)
Calcium: 8.4 mg/dL — ABNORMAL LOW (ref 8.9–10.3)
Chloride: 108 mmol/L (ref 98–111)
Creatinine, Ser: 1.23 mg/dL (ref 0.61–1.24)
GFR, Estimated: 58 mL/min — ABNORMAL LOW (ref >60)
Glucose, Bld: 116 mg/dL — ABNORMAL HIGH (ref 70–99)
Potassium: 4.4 mmol/L (ref 3.5–5.1)
Sodium: 139 mmol/L (ref 135–145)

## 2022-06-12 LAB — BRAIN NATRIURETIC PEPTIDE: B Natriuretic Peptide: 787.3 pg/mL — ABNORMAL HIGH (ref 0.0–100.0)

## 2022-06-12 LAB — TSH: TSH: 1.127 u[IU]/mL (ref 0.350–4.500)

## 2022-06-12 LAB — RESP PANEL BY RT-PCR (FLU A&B, COVID) ARPGX2
Influenza A by PCR: NEGATIVE
Influenza B by PCR: NEGATIVE
SARS Coronavirus 2 by RT PCR: NEGATIVE

## 2022-06-12 LAB — PROCALCITONIN: Procalcitonin: <0.1 ng/mL

## 2022-06-12 MED ORDER — TAMSULOSIN HCL 0.4 MG PO CAPS
0.4000 mg | ORAL_CAPSULE | Freq: Every day | ORAL | Status: DC
  Administered 2022-06-13 – 2022-06-19 (×6): 0.4 mg via ORAL
  Filled 2022-06-12 (×6): qty 1

## 2022-06-12 MED ORDER — IOHEXOL 350 MG/ML SOLN
75.0000 mL | Freq: Once | INTRAVENOUS | Status: AC | PRN
  Administered 2022-06-12: 75 mL via INTRAVENOUS

## 2022-06-12 MED ORDER — VANCOMYCIN HCL 1750 MG/350ML IV SOLN
1750.0000 mg | Freq: Once | INTRAVENOUS | Status: AC
  Administered 2022-06-12: 1750 mg via INTRAVENOUS
  Filled 2022-06-12: qty 350

## 2022-06-12 MED ORDER — ASPIRIN 81 MG PO TBEC
81.0000 mg | DELAYED_RELEASE_TABLET | Freq: Every day | ORAL | Status: DC
  Administered 2022-06-13 – 2022-06-19 (×6): 81 mg via ORAL
  Filled 2022-06-12 (×6): qty 1

## 2022-06-12 MED ORDER — ACETAMINOPHEN 325 MG PO TABS
650.0000 mg | ORAL_TABLET | ORAL | Status: DC | PRN
  Administered 2022-06-16: 650 mg via ORAL
  Filled 2022-06-12: qty 2

## 2022-06-12 MED ORDER — FUROSEMIDE 10 MG/ML IJ SOLN
40.0000 mg | Freq: Once | INTRAMUSCULAR | Status: DC
  Filled 2022-06-12: qty 4

## 2022-06-12 MED ORDER — LACTATED RINGERS IV SOLN: INTRAVENOUS | Status: DC

## 2022-06-12 MED ORDER — SODIUM CHLORIDE 0.9% FLUSH
3.0000 mL | Freq: Two times a day (BID) | INTRAVENOUS | Status: DC
  Administered 2022-06-12 – 2022-06-19 (×14): 3 mL via INTRAVENOUS

## 2022-06-12 MED ORDER — ROSUVASTATIN CALCIUM 5 MG PO TABS
5.0000 mg | ORAL_TABLET | ORAL | Status: DC
  Administered 2022-06-13 – 2022-06-19 (×4): 5 mg via ORAL
  Filled 2022-06-12 (×4): qty 1

## 2022-06-12 MED ORDER — LACTATED RINGERS IV BOLUS (SEPSIS): 1000.0000 mL | Freq: Once | INTRAVENOUS | Status: DC

## 2022-06-12 MED ORDER — SODIUM CHLORIDE 0.9 % IV SOLN
2.0000 g | Freq: Once | INTRAVENOUS | Status: AC
  Administered 2022-06-12: 2 g via INTRAVENOUS
  Filled 2022-06-12: qty 12.5

## 2022-06-12 MED ORDER — PANTOPRAZOLE SODIUM 40 MG PO TBEC
40.0000 mg | DELAYED_RELEASE_TABLET | Freq: Every day | ORAL | Status: DC
  Administered 2022-06-13 – 2022-06-19 (×6): 40 mg via ORAL
  Filled 2022-06-12 (×6): qty 1

## 2022-06-12 MED ORDER — ONDANSETRON HCL 4 MG/2ML IJ SOLN: 4.0000 mg | Freq: Four times a day (QID) | INTRAMUSCULAR | Status: DC | PRN

## 2022-06-12 MED ORDER — GABAPENTIN 100 MG PO CAPS
100.0000 mg | ORAL_CAPSULE | Freq: Every day | ORAL | Status: DC
  Administered 2022-06-13 – 2022-06-19 (×6): 100 mg via ORAL
  Filled 2022-06-12 (×6): qty 1

## 2022-06-12 MED ORDER — FUROSEMIDE 10 MG/ML IJ SOLN
40.0000 mg | Freq: Every day | INTRAMUSCULAR | Status: DC
  Administered 2022-06-13 – 2022-06-14 (×2): 40 mg via INTRAVENOUS
  Filled 2022-06-12 (×2): qty 4

## 2022-06-12 MED ORDER — SODIUM CHLORIDE 0.9% FLUSH: 3.0000 mL | INTRAVENOUS | Status: DC | PRN

## 2022-06-12 MED ORDER — ENOXAPARIN SODIUM 40 MG/0.4ML IJ SOSY
40.0000 mg | PREFILLED_SYRINGE | INTRAMUSCULAR | Status: DC
  Administered 2022-06-12 – 2022-06-18 (×7): 40 mg via SUBCUTANEOUS
  Filled 2022-06-12 (×7): qty 0.4

## 2022-06-12 MED ORDER — VANCOMYCIN HCL IN DEXTROSE 1-5 GM/200ML-% IV SOLN: 1000.0000 mg | Freq: Once | INTRAVENOUS | Status: DC

## 2022-06-12 MED ORDER — SODIUM CHLORIDE 0.9 % IV SOLN: 250.0000 mL | INTRAVENOUS | Status: DC | PRN

## 2022-06-12 MED ORDER — INSULIN GLARGINE-YFGN 100 UNIT/ML ~~LOC~~ SOLN
5.0000 [IU] | Freq: Every day | SUBCUTANEOUS | Status: DC
  Administered 2022-06-12 – 2022-06-15 (×4): 5 [IU] via SUBCUTANEOUS
  Filled 2022-06-12 (×5): qty 0.05

## 2022-06-12 MED ORDER — INSULIN ASPART 100 UNIT/ML IJ SOLN
0.0000 [IU] | Freq: Three times a day (TID) | INTRAMUSCULAR | Status: DC
  Administered 2022-06-13: 2 [IU] via SUBCUTANEOUS
  Administered 2022-06-13: 3 [IU] via SUBCUTANEOUS
  Administered 2022-06-14: 2 [IU] via SUBCUTANEOUS
  Administered 2022-06-14: 5 [IU] via SUBCUTANEOUS
  Administered 2022-06-14: 3 [IU] via SUBCUTANEOUS
  Administered 2022-06-15: 5 [IU] via SUBCUTANEOUS
  Administered 2022-06-15: 8 [IU] via SUBCUTANEOUS
  Administered 2022-06-15 – 2022-06-16 (×2): 11 [IU] via SUBCUTANEOUS
  Administered 2022-06-16: 5 [IU] via SUBCUTANEOUS
  Administered 2022-06-17: 8 [IU] via SUBCUTANEOUS
  Administered 2022-06-17: 5 [IU] via SUBCUTANEOUS
  Administered 2022-06-17: 15 [IU] via SUBCUTANEOUS
  Administered 2022-06-18: 5 [IU] via SUBCUTANEOUS
  Administered 2022-06-18 (×2): 8 [IU] via SUBCUTANEOUS
  Administered 2022-06-19: 3 [IU] via SUBCUTANEOUS
  Filled 2022-06-12 (×17): qty 1

## 2022-06-12 NOTE — Sepsis Progress Note (Signed)
Elink monitoring for the code sepsis protocol.  

## 2022-06-12 NOTE — Assessment & Plan Note (Signed)
Gradually worsening anemia, however hemoglobin stable at this time at 8.6.  No evidence of bleeding on examination  - Monitor CBC while admitted
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
## 2022-06-12 NOTE — H&P (Addendum)
History and Physical    Patient: Gregory Tran:[id number] DOB: 06/13/1935 DOA: 06/12/2022 DOS: the patient was seen and examined on 06/12/2022 PCP: Myrene Buddy, NP  Patient coming from: Home  Chief Complaint:  Chief Complaint  Patient presents with   Shortness of Breath   HPI: Gregory Tran is a 87 y.o. male with medical history significant of CAD s/p DES, hypertension, type 2 diabetes, chronic anemia, hyperlipidemia, TIA, BPH, and recent admission for sepsis secondary to pneumonia, who presents to the ED due to shortness of breath.  [name] states that he was at physical therapy and laying down when he suddenly became short of breath.  Patient's son at bedside states that physical therapist checked his oxygen saturation at that time and it was in the mid 59s.  He was immediately sat up and oxygen saturation improved to 88%.  Due to this, patient was brought to the ED.  [name] denies any worsening cough at home.  He is uncertain regarding shortness of breath, citing that he is quite sedentary.  He endorses gradually worsening lower extremity edema, stating his legs feel heavier.  He denies any chest pain or palpitations at this time.  He denies any fever, chills, nausea, vomiting, diarrhea, abdominal pain.  He notes that he completed his course of antibiotics after recent admission for pneumonia.  ED course: On arrival to the ED, patient was hypertensive at 151/63 with heart rate of 71.  He was saturating at 98% on room air.  He was afebrile 97.8.  Initial workup notable for BNP of 787, troponin of 61, hemoglobin of 8.6, glucose of 116, creatinine of 1.23 with GFR 58.  Lactic acid within normal limits at 1.8.  COVID-19 and influenza PCR negative. CTA was subsequently obtained that demonstrated no evidence of PE, however progressive multifocal bilateral airspace disease concerning for worsening pneumonia, progressive lymphadenopathy, and bilateral pleural  effusions that are new since prior exam.  Patient started on cefepime, vancomycin and IV fluids.  TRH contacted for admission.  Review of Systems: As mentioned in the history of present illness. All other systems reviewed and are negative.  Past Medical History:  Diagnosis Date   Arthritis    BPH (benign prostatic hyperplasia)    Cancer (HCC)    Basal Cell   Chronic kidney disease    Coronary artery disease    Diabetes mellitus (HCC)    Ejaculatory disorder    Erectile dysfunction    Frequency    GERD (gastroesophageal reflux disease)    Gross hematuria    Heart disease    Hematuria    HTN (hypertension)    Hyperlipidemia    Incomplete bladder emptying    Microscopic hematuria    Myocardial infarction (HCC)    Neuropathy    Nocturia    Rotator cuff tear left   TIA (transient ischemic attack)    Tick bite of multiple sites 5 days ago   chest, and groin area   Vertigo    1-2x/yr   Past Surgical History:  Procedure Laterality Date   cardiac stents     CATARACT EXTRACTION W/PHACO Left 08/02/2018   Procedure: CATARACT EXTRACTION PHACO AND INTRAOCULAR LENS PLACEMENT (IOC)  LEFT DIABETIC;  Surgeon: [place] Crane, MD;  Location: [facility];  Service: Ophthalmology;  Laterality: Left;  diabetes - insulin and oral meds   CATARACT EXTRACTION W/PHACO Right 10/11/2018   Procedure: CATARACT EXTRACTION PHACO AND INTRAOCULAR LENS PLACEMENT (IOC) RIGHT DIABETES;  Surgeon:  N[place] Crane, MD;  Location: [facility];  Service: Ophthalmology;  Laterality: Right;  Diabetic - insulin and oral meds   CORONARY ANGIOPLASTY  2001, 2008   [facility]   DUPUYTREN CONTRACTURE RELEASE Right 04/30/2016   Procedure: DUPUYTREN CONTRACTURE RELEASE;  Surgeon: Erin Sons, MD;  Location: [facility];  Service: Orthopedics;  Laterality: Right;   KYPHOPLASTY N/A 01/26/2018   Procedure: Pearletha Furl;  Surgeon: Kennedy Bucker, MD;  Location: [facility];  Service: Orthopedics;  Laterality:  N/A;   ROTATOR CUFF REPAIR Bilateral    TRANSURETHRAL RESECTION OF PROSTATE N/A 07/09/2015   Procedure: TRANSURETHRAL RESECTION OF THE PROSTATE (TURP);  Surgeon: Vanna Scotland, MD;  Location: [facility];  Service: Urology;  Laterality: N/A;   Social History:  reports that he quit smoking about 4 years ago. His smoking use included cigarettes. He has a 50.00 pack-year smoking history. He has never used smokeless tobacco. He reports that he does not drink alcohol and does not use drugs.  Allergies  Allergen Reactions   Atorvastatin Other (See Comments)   B Complex-Folic Acid Hives    Family History  Problem Relation Age of Onset   Ovarian cancer Sister    Kidney disease Brother        born one kidney   Bladder Cancer Neg Hx    Prostate cancer Neg Hx     Prior to Admission medications   Medication Sig Start Date End Date Taking? Authorizing Provider  ammonium lactate (AMLACTIN) 12 % cream Apply 1 Application topically 2 (two) times daily. (Apply to back)    [provider]  aspirin EC 81 MG tablet Take 81 mg by mouth daily.     [provider]  diphenhydrAMINE (BENADRYL) 25 mg capsule Take 1 capsule (25 mg total) by mouth every 6 (six) hours as needed for itching, sleep or allergies. 05/25/22   Pennie Banter, DO  gabapentin (NEURONTIN) 100 MG capsule Take 100 mg by mouth daily. 04/14/22   [provider]  insulin aspart (NOVOLOG) 100 UNIT/ML FlexPen Inject 3 Units into the skin 3 (three) times daily with meals. 05/25/22   Pennie Banter, DO  insulin degludec (TRESIBA) 100 UNIT/ML FlexTouch Pen Inject 10 Units into the skin daily. 05/25/22   Pennie Banter, DO  metFORMIN (GLUCOPHAGE) 1000 MG tablet Take 1,000 mg by mouth daily.    [provider]  pantoprazole (PROTONIX) 40 MG tablet Take 40 mg by mouth daily.     [provider]  rosuvastatin (CRESTOR) 5 MG tablet Take 5 mg by mouth every other day.     [provider]  tamsulosin  (FLOMAX) 0.4 MG CAPS capsule TAKE 1 CAPSULE(0.4 MG) BY MOUTH DAILY Patient taking differently: Take 0.4 mg by mouth daily. 12/30/16   Vanna Scotland, MD    Physical Exam: Vitals:   06/12/22 1700 06/12/22 1715 06/12/22 1730 06/12/22 1913  BP: (!) 156/65  (!) 144/70 (!) 153/78  Pulse: 69 80 68 73  Resp: (!) Temp:    (!) 97.5 F (36.4 C)  TempSrc:      SpO2: 93% 92% 93% 91%  Weight:      Height:       Physical Exam Vitals and nursing note reviewed.  Constitutional:      General: He is not in acute distress.    Appearance: He is normal weight.  HENT:     Head: Normocephalic and atraumatic.     Mouth/Throat:  Mouth: Mucous membranes are moist.     Pharynx: Oropharynx is clear.  Eyes:     Extraocular Movements: Extraocular movements intact.     Pupils: Pupils are equal, round, and reactive to light.  Neck:     Vascular: JVD present.  Cardiovascular:     Rate and Rhythm: Normal rate and regular rhythm.     Heart sounds: No murmur heard.    Comments: 2+ pitting edema extending to the lower abdomen Pulmonary:     Effort: Pulmonary effort is normal.     Breath sounds: Rhonchi (bilateral, lower worse than upper) present. No decreased breath sounds or wheezing.  Musculoskeletal:     Cervical back: Neck supple.     Right lower leg: 2+ Pitting Edema present.     Left lower leg: 2+ Pitting Edema present.  Skin:    General: Skin is warm and dry.  Neurological:     General: No focal deficit present.     Mental Status: He is alert. Mental status is at baseline.  Psychiatric:        Mood and Affect: Mood normal.        Behavior: Behavior normal.    Data Reviewed: CBC with WBC of 7.4, hemoglobin 8.6, MCV 96 and platelets of 362 BMP with sodium of 139, potassium 4.4, bicarb 23, glucose 116, BUN 18, creatinine 1.23 and GFR 58 BNP elevated 787 Troponin elevated at 61 Lactic acid within normal limits at 1.8 COVID-19 and influenza PCR negative  EKG personally  reviewed.  Sinus rhythm with rate of 69.  No ST or T wave changes consistent with acute ischemia.  CT Angio Chest PE W and/or Wo Contrast  Result Date: 06/12/2022 CLINICAL DATA:  Short of breath, productive cough EXAM: CT ANGIOGRAPHY CHEST WITH CONTRAST TECHNIQUE: Multidetector CT imaging of the chest was performed using the standard protocol during bolus administration of intravenous contrast. Multiplanar CT image reconstructions and MIPs were obtained to evaluate the vascular anatomy. RADIATION DOSE REDUCTION: This exam was performed according to the departmental dose-optimization program which includes automated exposure control, adjustment of the mA and/or kV according to patient size and/or use of iterative reconstruction technique. CONTRAST:  75mL OMNIPAQUE IOHEXOL 350 MG/ML SOLN COMPARISON:  05/21/2022 FINDINGS: Cardiovascular: This is a technically adequate evaluation of the pulmonary vasculature. No filling defects or pulmonary emboli. The heart is unremarkable without pericardial effusion. There is dense atherosclerosis of the coronary vasculature greatest in the LAD and right coronary distribution. No evidence of thoracic aortic aneurysm or dissection. Stable atherosclerosis of the aorta. Mediastinum/Nodes: Stable appearance of the thyroid. Mucoid material layers dependently within the bilateral mainstem bronchi. The esophagus is unremarkable. Mild bilateral axillary lymphadenopathy has progressed since prior study. Index lymph node adjacent to the right subclavian vein reference image 39/4 measures 11 mm, previously measuring 8 mm. Multiple subcentimeter mediastinal and hilar lymph nodes are again noted. Lungs/Pleura: Interval development of bilateral pleural effusions, volume estimated less than 1 L each. Multifocal bilateral ground-glass airspace disease has progressed, right greater than left. There is bilateral bronchial wall thickening. No pneumothorax. Upper Abdomen: No acute abnormality.  Musculoskeletal: No acute or destructive bony lesions. Reconstructed images demonstrate no additional findings. Review of the MIP images confirms the above findings. IMPRESSION: 1. No evidence of pulmonary embolus. 2. Progressive multifocal bilateral airspace disease and bronchial wall thickening, consistent with worsening pneumonia. 3. Progressive mediastinal, hilar, and axillary lymphadenopathy, likely reactive. 4. Bilateral pleural effusions, new since prior exam, volume estimated less than 1 L  each. 5. Aortic Atherosclerosis (ICD10-I70.0). Coronary artery atherosclerosis. Electronically Signed   By: Sharlet Salina M.D.   On: 06/12/2022 16:09    Results are pending, will review when available.  Assessment and Plan:  * Acute heart failure (HCC) Patient is presenting with sudden shortness of breath and respiratory distress when laying flat at physical therapy.  History of gradually worsening lower extremity edema.  Notably hypervolemic on exam with 2+ pitting edema up to the abdomen. BNP is elevated at 787 with evidence of bilateral pleural effusions on CT.  Worsening airspace disease most likely represents edema rather than pneumonia.  Will get a procalcitonin to be thorough though.    - Telemetry monitoring - Echocardiogram ordered - Start Lasix 40 mg IV daily - Strict in and out - Daily weights - No indication for thoracentesis at this time - Discontinue fluids and antibiotics  Coronary artery disease Per chart review, patient has a history of CAD s/p previous stents.  Unable to find [facility] records though.  Troponin is currently elevated, however with a flat trend, most likely secondary to demand in the setting of heart failure.  No active chest pain at this time.  - Continue aspirin and statin  Stage 3 chronic kidney disease Renal function currently at baseline compared to prior admission.  Will monitor closely while diuresing.  - Daily BMP - Strict in and out - Monitor for urinary  retention  Type 2 diabetes mellitus with peripheral neuropathy (HCC) - Hold home antiglycemic agents - SSI moderate - Semglee 5 units at bedtime  Chronic anemia Gradually worsening anemia, however hemoglobin stable at this time at 8.6.  No evidence of bleeding on examination  - Monitor CBC while admitted  Advance Care Planning:   Code Status: Full Code verified by patient and his son at bedside. Son states he is healthcare power of attorney.  Consults: None  Family Communication: Patient's son updated at bedside  Severity of Illness: The appropriate patient status for this patient is INPATIENT. Inpatient status is judged to be reasonable and necessary in order to provide the required intensity of service to ensure the patient's safety. The patient's presenting symptoms, physical exam findings, and initial radiographic and laboratory data in the context of their chronic comorbidities is felt to place them at high risk for further clinical deterioration. Furthermore, it is not anticipated that the patient will be medically stable for discharge from the hospital within 2 midnights of admission.   * I certify that at the point of admission it is my clinical judgment that the patient will require inpatient hospital care spanning beyond 2 midnights from the point of admission due to high intensity of service, high risk for further deterioration and high frequency of surveillance required.*  Author: Verdene Lennert, MD 06/12/2022 7:26 PM  For on call review [email or web address].

## 2022-06-12 NOTE — ED Triage Notes (Signed)
Pt presents to the ED with [facility] from home for [facility]. Pt had PT today and became SOB quicker than usual. Pt reports a productive cough. Was recently hospitalized for PNA.Per EMS pt's O2 saturation was 89% on RA. Pt was placed on 3L [place] by EMS was a O2 saturation of 99%. Pt A&Ox4 at time of triage. Pt 96% on RA at time of triage.

## 2022-06-12 NOTE — Assessment & Plan Note (Signed)
-   Hold home antiglycemic agents - SSI moderate - Semglee 5 units at bedtime

## 2022-06-12 NOTE — ED Provider Notes (Signed)
Care assumed of patient from outgoing provider.  See their note for initial history, exam and plan.  Clinical Course as of 06/12/22 1510  Thu Jun 12, 2022  1508 Recent hospitalization for PNA and recent dc in the past couple of weeks.  Never returned back to baseline.  Working with PT today and had worsening symptoms of dyspnea and cough.  No history of CHF.  Concern for bronchitis vs PNA.  EKG w/o concerning features, trop pending.  CTA pending.  If negative work up and ambulatory sat wnl then plan to dc.  Otherwise will need admission.  [SM]    Clinical Course User Index [SM] Corena Herter, MD   Patient with no leukocytosis.  Anemia with a hemoglobin 8.6 down from 9.5 during his last hospitalization.  Platelets within normal limits.  Creatinine appears to be at baseline.  No significant electrolyte abnormalities.  Troponin elevated at 61 which is increased from February but does have a chronically elevated troponin.  EKG without findings of acute ischemia or dysrhythmia.  No significant change compared to prior EKG.  CTA without signs of pulmonary embolism.  Read as progressive multifocal airspace disease concerning for worsening pneumonia.  Worsening lymphadenopathy.  Also noted bilateral pleural effusions which is new when compared to prior exam.  Patient does have an elevated troponin and elevated BNP.  Given signs of pulmonary edema  feel that 30 cc/kg of IV fluids would be detrimental to the patient, will start on maintenance fluid at 170 cc an hour and reevaluate once lactic acid has returned.  Broadened antibiotics to cover for hospital-acquired given his recent hospitalization with IV vancomycin and cefepime.  Consulted hospitalist for admission.      Corena Herter, MD 06/12/22 [phone]

## 2022-06-12 NOTE — Code Documentation (Signed)
CODE SEPSIS - PHARMACY COMMUNICATION  **Broad Spectrum Antibiotics should be administered within 1 hour of Sepsis diagnosis**  Time Code Sepsis Called/Page Received: 1628  Antibiotics Ordered: cefepime and vancomycin  Time of 1st antibiotic administration: 1658  Additional action taken by pharmacy: N/A    Manfred Shirts ,PharmD Clinical Pharmacist  06/12/2022  4:27 PM

## 2022-06-12 NOTE — Progress Notes (Signed)
PHARMACY -  BRIEF ANTIBIOTIC NOTE   Pharmacy has received consult(s) for vancomycin and cefepime from an ED provider.  The patient's profile has been reviewed for ht/wt/allergies/indication/available labs.    One time order(s) placed for cefepime 2 gm IV and vancomycin 1750 mg IV  Further antibiotics/pharmacy consults should be ordered by admitting physician if indicated.                       Thank you, Manfred Shirts 06/12/2022  4:25 PM

## 2022-06-12 NOTE — Assessment & Plan Note (Addendum)
Patient is presenting with sudden shortness of breath and respiratory distress when laying flat at physical therapy.  History of gradually worsening lower extremity edema.  Notably hypervolemic on exam with 2+ pitting edema up to the abdomen. BNP is elevated at 787 with evidence of bilateral pleural effusions on CT.  Worsening airspace disease most likely represents edema rather than pneumonia.  Will get a procalcitonin to be thorough though.    - Telemetry monitoring - Echocardiogram ordered - Start Lasix 40 mg IV daily - Strict in and out - Daily weights - No indication for thoracentesis at this time - Discontinue fluids and antibiotics

## 2022-06-12 NOTE — Assessment & Plan Note (Signed)
Per chart review, patient has a history of CAD s/p previous stents.  Unable to find [facility] records though.  Troponin is currently elevated, however with a flat trend, most likely secondary to demand in the setting of heart failure.  No active chest pain at this time.  - Continue aspirin and statin

## 2022-06-12 NOTE — Assessment & Plan Note (Signed)
Renal function currently at baseline compared to prior admission.  Will monitor closely while diuresing.  - Daily BMP - Strict in and out - Monitor for urinary retention

## 2022-06-13 ENCOUNTER — Encounter: Payer: Self-pay | Admitting: Internal Medicine

## 2022-06-13 ENCOUNTER — Inpatient Hospital Stay
Admit: 2022-06-13 | Discharge: 2022-06-13 | Disposition: A | Discharge status: Home / Self Care | Payer: Medicare Other | Attending: Internal Medicine | Admitting: Internal Medicine

## 2022-06-13 DIAGNOSIS — D649 Anemia, unspecified: Secondary | ICD-10-CM | POA: Diagnosis not present

## 2022-06-13 DIAGNOSIS — E1142 Type 2 diabetes mellitus with diabetic polyneuropathy: Secondary | ICD-10-CM | POA: Diagnosis not present

## 2022-06-13 DIAGNOSIS — I2510 Atherosclerotic heart disease of native coronary artery without angina pectoris: Secondary | ICD-10-CM

## 2022-06-13 DIAGNOSIS — I509 Heart failure, unspecified: Secondary | ICD-10-CM | POA: Diagnosis not present

## 2022-06-13 LAB — BASIC METABOLIC PANEL
Anion gap: 11 (ref 5–15)
BUN: 19 mg/dL (ref 8–23)
CO2: 21 mmol/L — ABNORMAL LOW (ref 22–32)
Calcium: 8.4 mg/dL — ABNORMAL LOW (ref 8.9–10.3)
Chloride: 109 mmol/L (ref 98–111)
Creatinine, Ser: 1.35 mg/dL — ABNORMAL HIGH (ref 0.61–1.24)
GFR, Estimated: 52 mL/min — ABNORMAL LOW (ref >60)
Glucose, Bld: 117 mg/dL — ABNORMAL HIGH (ref 70–99)
Potassium: 4.6 mmol/L (ref 3.5–5.1)
Sodium: 141 mmol/L (ref 135–145)

## 2022-06-13 LAB — CBC WITH DIFFERENTIAL/PLATELET
Abs Immature Granulocytes: 0.03 10*3/uL (ref 0.00–0.07)
Basophils Absolute: 0 10*3/uL (ref 0.0–0.1)
Basophils Relative: 0 %
Eosinophils Absolute: 0.8 10*3/uL — ABNORMAL HIGH (ref 0.0–0.5)
Eosinophils Relative: 10 %
HCT: 28.6 % — ABNORMAL LOW (ref 39.0–52.0)
Hemoglobin: 8.8 g/dL — ABNORMAL LOW (ref 13.0–17.0)
Immature Granulocytes: 0 %
Lymphocytes Relative: 4 %
Lymphs Abs: 0.4 10*3/uL — ABNORMAL LOW (ref 0.7–4.0)
MCH: 28.9 pg (ref 26.0–34.0)
MCHC: 30.8 g/dL (ref 30.0–36.0)
MCV: 94.1 fL (ref 80.0–100.0)
Monocytes Absolute: 0.2 10*3/uL (ref 0.1–1.0)
Monocytes Relative: 3 %
Neutro Abs: 7.2 10*3/uL (ref 1.7–7.7)
Neutrophils Relative %: 83 %
Platelets: 393 10*3/uL (ref 150–400)
RBC: 3.04 MIL/uL — ABNORMAL LOW (ref 4.22–5.81)
RDW: 15.9 % — ABNORMAL HIGH (ref 11.5–15.5)
WBC: 8.7 10*3/uL (ref 4.0–10.5)
nRBC: 0.2 % (ref 0.0–0.2)

## 2022-06-13 LAB — GLUCOSE, CAPILLARY
Glucose-Capillary: 110 mg/dL — ABNORMAL HIGH (ref 70–99)
Glucose-Capillary: 151 mg/dL — ABNORMAL HIGH (ref 70–99)
Glucose-Capillary: 140 mg/dL — ABNORMAL HIGH (ref 70–99)
Glucose-Capillary: 191 mg/dL — ABNORMAL HIGH (ref 70–99)

## 2022-06-13 LAB — ECHOCARDIOGRAM COMPLETE: Weight: 2977.09 oz

## 2022-06-13 LAB — MAGNESIUM: Magnesium: 1.9 mg/dL (ref 1.7–2.4)

## 2022-06-13 LAB — CULTURE, BLOOD (SINGLE)
Culture: NO GROWTH
Special Requests: ADEQUATE

## 2022-06-13 MED ORDER — IPRATROPIUM-ALBUTEROL 0.5-2.5 (3) MG/3ML IN SOLN
3.0000 mL | Freq: Three times a day (TID) | RESPIRATORY_TRACT | Status: DC
  Administered 2022-06-13 – 2022-06-14 (×2): 3 mL via RESPIRATORY_TRACT
  Filled 2022-06-13 (×2): qty 3

## 2022-06-13 MED ORDER — IPRATROPIUM-ALBUTEROL 0.5-2.5 (3) MG/3ML IN SOLN
3.0000 mL | RESPIRATORY_TRACT | Status: DC
  Administered 2022-06-13: 3 mL via RESPIRATORY_TRACT
  Filled 2022-06-13: qty 3

## 2022-06-13 MED ORDER — HYDROCORTISONE 1 % EX CREA
TOPICAL_CREAM | CUTANEOUS | Status: DC | PRN
  Filled 2022-06-13 (×3): qty 28

## 2022-06-13 NOTE — Evaluation (Addendum)
Occupational Therapy Evaluation Patient Details Name: Gregory Tran MRN: [id number] DOB: May 03, 1935 Today's Date: 06/13/2022   History of Present Illness Pt is an 87 y.o. male presenting to hospital 06/12/22 with c/o SOB/respiratory distress when laying flat (working with physical therapy). Recent admission for PNA and sepsis; receiving HHPT  Pt admitted with acute heart failure.  PMH includes DM, htn, CAD s/p DES, chronic anemia, HLD, TIA, BPH, CKD, MI, neuropathy, RC tear, vertigo, R dupuytren contracture release, L4-5 kyphoplasty.   Clinical Impression   Patient presenting with decreased Ind in self care, balance, functional mobility/transfers, endurance, and safety awareness. Patient's son present in room who has recently taken FMLA to stay and assist pt as he normally lives at home alone. Pt ambulates with rollator at baseline for short distances in home but has been needing assistance with all IADLs and most ADLs recently. OT did discuss recommendation at drop arm commode chair for when pt is feeling unwell and to increase safety. Pt seated in recliner chair but basically laying down in it and concerns for fall risk. Mod A to reposition and min A to stand and transfer to bed. Pt's O2 saturation was 85% and RN present placed pt on 2Ls O2 via [place].  Patient will benefit from acute OT to increase overall independence in the areas of ADLs, functional mobility, and safety awareness in order to safely discharge.     Recommendations for follow up therapy are one component of a multi-disciplinary discharge planning process, led by the attending physician.  Recommendations may be updated based on patient status, additional functional criteria and insurance authorization.   Assistance Recommended at Discharge Frequent or constant Supervision/Assistance  Patient can return home with the following A little help with walking and/or transfers;A little help with bathing/dressing/bathroom;Assistance with  cooking/housework;Direct supervision/assist for medications management;Direct supervision/assist for financial management;Assist for transportation;Help with stairs or ramp for entrance    Functional Status Assessment  Patient has had a recent decline in their functional status and demonstrates the ability to make significant improvements in function in a reasonable and predictable amount of time.  Equipment Recommendations  Other (comment) (drop arm commode chair)       Precautions / Restrictions Precautions Precautions: Fall Restrictions Weight Bearing Restrictions: No      Mobility Bed Mobility Overal bed mobility: Needs Assistance Bed Mobility: Sit to Supine       Sit to supine: Min assist        Transfers Overall transfer level: Needs assistance Equipment used: Rolling walker (2 wheels) Transfers: Sit to/from Stand Sit to Stand: Min guard, Min assist                  Balance Overall balance assessment: History of Falls, Needs assistance Sitting-balance support: No upper extremity supported, Feet supported Sitting balance-Leahy Scale: Good Sitting balance - Comments: steady sitting reaching within BOS   Standing balance support: Bilateral upper extremity supported, During functional activity Standing balance-Leahy Scale: Good Standing balance comment: steady with transfer bed to recliner using RW                           ADL either performed or assessed with clinical judgement   ADL Overall ADL's : Needs assistance/impaired                         Toilet Transfer: Minimal assistance;Rolling walker (2 wheels);Stand-pivot Statistician Details (indicate cue type and reason):  simulated                 Vision Patient Visual Report: No change from baseline              Pertinent Vitals/Pain Pain Assessment Pain Assessment: No/denies pain     Hand Dominance Right   Extremity/Trunk Assessment Upper Extremity  Assessment Upper Extremity Assessment: Generalized weakness   Lower Extremity Assessment Lower Extremity Assessment: Generalized weakness   Cervical / Trunk Assessment Cervical / Trunk Assessment: Other exceptions Cervical / Trunk Exceptions: forward head/shoulders   Communication Communication Communication: No difficulties   Cognition Arousal/Alertness: Awake/alert Behavior During Therapy: WFL for tasks assessed/performed Overall Cognitive Status: No family/caregiver present to determine baseline cognitive functioning                                 General Comments: Oriented to at least person, place, and general situation.                Home Living Family/patient expects to be discharged to:: Private residence Living Arrangements: Alone Available Help at Discharge: Family;Available 24 hours/day (Pt's son currently on FMLA to stay and assist him at home) Type of Home: House Home Access: Stairs to enter [employer] of Steps: 4-5 steps with B railing from front entrance; 5-6 steps with R railing from garage entrance Entrance Stairs-Rails: Right Home Layout: One level     Bathroom Shower/Tub: Producer, television/film/video: Standard     Home Equipment: Rollator (4 wheels);Cane - single point;Hand held shower head;Shower seat;Grab bars - tub/shower;Grab bars - toilet          Prior Functioning/Environment Prior Level of Function : Needs assist       Physical Assist : Mobility (physical);ADLs (physical)     Mobility Comments: Modified independent ambulating with rollator in home; h/o falls.  Receiving HHPT.  H/o falls.  Pt sleeps in recliner. ADLs Comments: Pt's son reports assisting pt with most things (including bathing, meals, medications, cleaning). Pt ambulates with rollator to bathroom for toileting needs.        OT Problem List: Impaired balance (sitting and/or standing);Decreased cognition;Decreased safety  awareness;Decreased activity tolerance      OT Treatment/Interventions: Self-care/ADL training;Therapeutic exercise;Therapeutic activities;Energy conservation;Patient/family education;Balance training    OT Goals(Current goals can be found in the care plan section) Acute Rehab OT Goals Patient Stated Goal: to get stronger OT Goal Formulation: With family Time For Goal Achievement: 06/27/22 Potential to Achieve Goals: Fair ADL Goals Pt Will Perform Grooming: with supervision;standing Pt Will Perform Lower Body Dressing: with supervision;sit to/from stand Pt Will Transfer to Toilet: with supervision;ambulating Pt Will Perform Toileting - Clothing Manipulation and hygiene: with supervision;sit to/from stand  OT Frequency: Min 1X/week       AM-PAC OT "6 Clicks" Daily Activity     Outcome Measure Help from another person eating meals?: None Help from another person taking care of personal grooming?: A Little Help from another person toileting, which includes using toliet, bedpan, or urinal?: A Little Help from another person bathing (including washing, rinsing, drying)?: A Little Help from another person to put on and taking off regular upper body clothing?: None Help from another person to put on and taking off regular lower body clothing?: A Little 6 Click Score: 20   End of Session Equipment Utilized During Treatment: Rolling walker (2 wheels);Gait belt Nurse Communication: Mobility status  Activity Tolerance: Patient  tolerated treatment well Patient left: with call bell/phone within reach;in bed;with bed alarm set;with nursing/sitter in room  OT Visit Diagnosis: History of falling (Z91.81);Repeated falls (R29.6);Muscle weakness (generalized) (M62.81);Unsteadiness on feet (R26.81)                Time: 1610-9604 OT Time Calculation (min): 18 min Charges:  OT General Charges $OT Visit: 1 Visit OT Evaluation $OT Eval Moderate Complexity: 1 Mod OT Treatments $Therapeutic  Activity: 8-22 mins  Jackquline Denmark, MS, OTR/L , CBIS ascom [phone]  06/13/22, 11:39 AM

## 2022-06-13 NOTE — Progress Notes (Signed)
Progress Note   Patient: Gregory Tran ZOX:[id number] DOB: [birth year] DOA: 06/12/2022     1 DOS: the patient was seen and examined on 06/13/2022   Brief hospital course: Gregory Tran is a 87 y.o. male with medical history significant of CAD s/p DES, hypertension, type 2 diabetes, chronic anemia, hyperlipidemia, TIA, BPH, and recent admission for sepsis secondary to pneumonia, who presents to the ED due to shortness of breath.   Mr. Gregory Tran states that he was at physical therapy and laying down when he suddenly became short of breath.  Patient's son at bedside states that physical therapist checked his oxygen saturation at that time and it was in the mid 53s.  He was immediately sat up and oxygen saturation improved to 88%.  Due to this, patient was brought to the ED.  Mr. Gregory Tran denies any worsening cough at home.  He is uncertain regarding shortness of breath, citing that he is quite sedentary.  He endorses gradually worsening lower extremity edema, stating his legs feel heavier.  He denies any chest pain or palpitations at this time.  He denies any fever, chills, nausea, vomiting, diarrhea, abdominal pain.  He notes that he completed his course of antibiotics after recent admission for pneumonia.   ED course: On arrival to the ED, patient was hypertensive at 151/63 with heart rate of 71.  He was saturating at 98% on room air.  He was afebrile 97.8.  Initial workup notable for BNP of 787, troponin of 61, hemoglobin of 8.6, glucose of 116, creatinine of 1.23 with GFR 58.  Lactic acid within normal limits at 1.8.  COVID-19 and influenza PCR negative. CTA was subsequently obtained that demonstrated no evidence of PE, however progressive multifocal bilateral airspace disease concerning for worsening pneumonia, progressive lymphadenopathy, and bilateral pleural effusions that are new since prior exam.  Patient started on cefepime, vancomycin and IV fluids.  TRH contacted for admission.  4/26  : Patient still symptomatic with shortness of breath with desaturation with minimal exertion.  Exam with bilateral wheeze and basal Rales.  Mild elevation in creatinine.   Assessment and Plan:  * Acute heart failure  Patient is presenting with sudden shortness of breath and respiratory distress when laying flat at physical therapy.  History of gradually worsening lower extremity edema.  Notably hypervolemic on exam with 2+ pitting edema up to the abdomen. BNP is elevated at 787 with evidence of bilateral pleural effusions on CT.  Worsening airspace disease most likely represents edema rather than pneumonia.  Will get a procalcitonin to be thorough though.    - Telemetry monitoring - Echocardiogram ordered - Start Lasix 40 mg IV daily - Strict in and out - Daily weights, daily BMP - No indication for thoracentesis at this time -Patient has remote history of cigarette smoke now with bilateral expiratory wheeze.  Start the patient on nebulizer every 4 hours scheduled doses.  Coronary artery disease Per chart review, patient has a history of CAD s/p previous stents.  Unable to find [facility] records though.  Troponin is currently elevated, however with a flat trend, most likely secondary to demand in the setting of heart failure.  No active chest pain at this time.  - Continue aspirin and statin  Stage 3 chronic kidney disease  Renal function currently at baseline compared to prior admission.  Will monitor closely while diuresing.  - Daily BMP - Strict in and out - Monitor for urinary retention  Type 2 diabetes mellitus with peripheral neuropathy -  Hold home antiglycemic agents - SSI moderate - Semglee 5 units at bedtime  Chronic anemia Gradually worsening anemia, however hemoglobin stable at this time at 8.6.  No evidence of bleeding on examination  - Monitor CBC while admitted     Subjective: Patient seen and examined this morning.  Continues to be dramatic with respiratory distress on  minimal exertion.  Exam evident of bilateral wheeze and rales.  Physical Exam: Vitals:   06/12/22 2324 06/13/22 0527 06/13/22 0755 06/13/22 1215  BP: (!) 134/55 (!) 143/67 (!) 141/67 (!) 158/78  Pulse: 90 91 87 84  Resp: 18 20 18 18   Temp: 97.8 F (36.6 C) 98.1 F (36.7 C) 98.2 F (36.8 C) 97.6 F (36.4 C)  TempSrc:   Oral Oral  SpO2: 92% 90% 93% 97%  Weight:  84.4 kg    Height:       Physical Exam HENT:     Head: Normocephalic.  Eyes:     Pupils: Pupils are equal, round, and reactive to light.  Cardiovascular:     Rate and Rhythm: Regular rhythm.  Pulmonary:     Effort: Tachypnea and respiratory distress present.     Comments: Bilateral expiratory wheeze with basal Rales Musculoskeletal:        General: Normal range of motion.     Comments: 2+ pitting edema bilaterally right more than left  Neurological:     General: No focal deficit present.     Mental Status: He is alert and oriented to person, place, and time. Mental status is at baseline.  Psychiatric:        Mood and Affect: Mood normal.        Behavior: Behavior normal.     Data Reviewed:  There are no new results to review at this time.  Family Communication: Son by bedside updated about the status. Disposition: Status is: Inpatient Remains inpatient appropriate because: CHF exacerbation  Planned Discharge Destination: Home    Time spent: 32 minutes  Author: Kirstie Peri, MD 06/13/2022 12:45 PM  For on call review [email or web address].

## 2022-06-13 NOTE — Evaluation (Signed)
Physical Therapy Evaluation Patient Details Name: Gregory Tran MRN: [id number] DOB: Nov 07, 1935 Today's Date: 06/13/2022  History of Present Illness  Pt is an 87 y.o. male presenting to hospital 06/12/22 with c/o SOB/respiratory distress when laying flat (working with physical therapy). Recent admission for PNA and sepsis; receiving HHPT  Pt admitted with acute heart failure.  PMH includes DM, htn, CAD s/p DES, chronic anemia, HLD, TIA, BPH, CKD, MI, neuropathy, RC tear, vertigo, R dupuytren contracture release, L4-5 kyphoplasty.  Clinical Impression  Prior to hospital admission, pt was modified independent ambulating with rollator within home; receiving HHPT; lives alone but pt's son (from out of state) is currently staying with pt and assisting pt as needed.  No c/o pain during session.  Currently pt is mod assist semi-supine to sitting edge of bed (pt normally sleeps in recliner); CGA with transfers; and CGA to ambulate a few feet bed to recliner with RW use.  Limited distance ambulating d/t pt's SOB, wheezing, and decreased activity tolerance.  (On room air) pt's SpO2 sats 90% at rest laying in bed beginning of session; 92% sitting on edge of bed; 84% after transfer to recliner; and 92% at rest end of session sitting in recliner: MD and pt's nurse updated on pt's SpO2 sats during session.  Pt would currently benefit from skilled PT to address noted impairments and functional limitations (see below for any additional details).  Upon hospital discharge, pt would benefit from ongoing therapy and continued assist from family.    Recommendations for follow up therapy are one component of a multi-disciplinary discharge planning process, led by the attending physician.  Recommendations may be updated based on patient status, additional functional criteria and insurance authorization.        Assistance Recommended at Discharge Frequent or constant Supervision/Assistance  Patient can return home with  the following  A little help with walking and/or transfers;A little help with bathing/dressing/bathroom;Assistance with cooking/housework;Direct supervision/assist for medications management;Assist for transportation;Help with stairs or ramp for entrance    Equipment Recommendations Other (comment) (pt has RW at home already)  Recommendations for Other Services  OT consult    Functional Status Assessment Patient has had a recent decline in their functional status and demonstrates the ability to make significant improvements in function in a reasonable and predictable amount of time.     Precautions / Restrictions Precautions Precautions: Fall Restrictions Weight Bearing Restrictions: No      Mobility  Bed Mobility   Bed Mobility: Supine to Sit     Supine to sit: Mod assist, HOB elevated     General bed mobility comments: assist for trunk semi-supine to sitting edge of bed; vc's for technique    Transfers Overall transfer level: Needs assistance Equipment used: Rolling walker (2 wheels) Transfers: Sit to/from Stand Sit to Stand: Min guard           General transfer comment: vc's for UE placement; increased effort to stand up to RW    Ambulation/Gait Ambulation/Gait assistance: Min guard Gait Distance (Feet): 3 Feet (bed to recliner) Assistive device: Rolling walker (2 wheels)   Gait velocity: decreased     General Gait Details: decreased B LE step length/foot clearance  Stairs            Wheelchair Mobility    Modified Rankin (Stroke Patients Only)       Balance Overall balance assessment: History of Falls, Needs assistance Sitting-balance support: No upper extremity supported, Feet supported Sitting balance-Leahy Scale: Good Sitting balance -  Comments: steady sitting reaching within BOS   Standing balance support: Bilateral upper extremity supported, During functional activity Standing balance-Leahy Scale: Good Standing balance comment:  steady with transfer bed to recliner using RW                             Pertinent Vitals/Pain Pain Assessment Pain Assessment: No/denies pain HR WFL during sessions activities.    Home Living Family/patient expects to be discharged to:: Private residence Living Arrangements: Children (pt's son (who is from out of state) is staying with pt recently (transitioning to [facility] to stay to help pt)) Available Help at Discharge: Family;Available 24 hours/day Type of Home: House Home Access: Stairs to enter   [employer] of Steps: 4-5 steps with B railing from front entrance; 5-6 steps with R railing from garage entrance   Home Layout: One level Home Equipment: Rollator (4 wheels);Cane - single point;Hand held shower head;Shower seat;Grab bars - tub/shower;Grab bars - toilet      Prior Function Prior Level of Function : Needs assist       Physical Assist : Mobility (physical);ADLs (physical)     Mobility Comments: Modified independent ambulating with rollator in home; h/o falls.  Receiving HHPT.  H/o falls.  Pt sleeps in recliner. ADLs Comments: Pt's son reports assisting pt with most things (including bathing, meals, medications, cleaning)     Hand Dominance        Extremity/Trunk Assessment   Upper Extremity Assessment Upper Extremity Assessment: Defer to OT evaluation;Generalized weakness    Lower Extremity Assessment Lower Extremity Assessment: Generalized weakness    Cervical / Trunk Assessment Cervical / Trunk Assessment: Other exceptions Cervical / Trunk Exceptions: forward head/shoulders  Communication   Communication: No difficulties  Cognition Arousal/Alertness: Awake/alert Behavior During Therapy: WFL for tasks assessed/performed                                   General Comments: Oriented to at least person, place, and general situation.        General Comments  Nursing cleared pt for participation in physical  therapy.  Pt agreeable to PT session.  Pt's son present beginning/end of session (left during session).    Exercises     Assessment/Plan    PT Assessment Patient needs continued PT services  PT Problem List Decreased strength;Decreased activity tolerance;Decreased balance;Decreased mobility;Cardiopulmonary status limiting activity       PT Treatment Interventions DME instruction;Gait training;Stair training;Functional mobility training;Therapeutic activities;Therapeutic exercise;Balance training;Patient/family education    PT Goals (Current goals can be found in the Care Plan section)  Acute Rehab PT Goals Patient Stated Goal: to improve breathing and strength PT Goal Formulation: With patient Time For Goal Achievement: 06/27/22 Potential to Achieve Goals: Good    Frequency Min 3X/week     Co-evaluation               AM-PAC PT "6 Clicks" Mobility  Outcome Measure Help needed turning from your back to your side while in a flat bed without using bedrails?: None Help needed moving from lying on your back to sitting on the side of a flat bed without using bedrails?: A Lot Help needed moving to and from a bed to a chair (including a wheelchair)?: A Little Help needed standing up from a chair using your arms (e.g., wheelchair or bedside chair)?: A Little Help needed to  walk in hospital room?: A Little Help needed climbing 3-5 steps with a railing? : A Lot 6 Click Score: 17    End of Session Equipment Utilized During Treatment: Gait belt Activity Tolerance: Patient limited by fatigue;Other (comment) (Limited d/t SOB and wheezing) Patient left: in chair;with call bell/phone within reach;with chair alarm set;with family/visitor present Nurse Communication: Mobility status;Precautions;Other (comment) (pt's symptoms and O2 sats during session) PT Visit Diagnosis: Other abnormalities of gait and mobility (R26.89);Muscle weakness (generalized) (M62.81);History of falling (Z91.81)     Time: 1610-9604 PT Time Calculation (min) (ACUTE ONLY): 30 min   Charges:   PT Evaluation $PT Eval Low Complexity: 1 Low PT Treatments $Therapeutic Activity: 8-22 mins       Hendricks Limes, PT 06/13/22, 9:35 AM

## 2022-06-13 NOTE — Consult Note (Signed)
   Heart Failure Nurse Navigator Note  HF-echocardiogram results are pending at this time.  In 2018 EF reported at >55%.   Presented to the emergency room with complaints of shortness of breath and worsening lower extremity edema.  He had been treated earlier in the month for pneumonia.  BNP was 787.  Chest x-ray bilateral pleural effusions and multifocal bilateral airspace disease.   Comorbidities:  Coronary artery disease status post drug-eluting stent Hypertension Diabetes type 2 Chronic anemia Hyperlipidemia TIA  Medications:  Aspirin 81 mg daily Furosemide 40 mg IV daily Crestor 5 mg every other day  His home medications included amlodipine 10 mg daily and metoprolol succinate 25 mg daily.  Labs:  Sodium 141, potassium 4.6, chloride 109, CO2 21, BUN 19, creatinine 1.35, estimated GFR 52. Weight is 84.4 kg Intake 820 mL Output 400 mL  Initial meeting with patient and his son here from [place] helping to take care of his dad.  Discussed heart failure and what it means.  Went over how to take care of him once he is discharged from the hospital by removing the salt shaker from the table, using natural spices.  Low sodium diet, staying away from processed foods.  Went over fluid restriction and not drinking more than 64 ounces daily.  Listed all that is considered a liquid.  Stressed the importance of daily weights, reporting 2 pound weight gain over night or 5 pounds within a week.  Also to report changes in symptoms, such as increasing SOB, edema etc.  Made aware of follow up in the heart failure clinic on May 16 at 3:30 PM.  He was given the living with heart failure teaching booklet, info on low sodium and heart failure, weight chart and zone magnet.  Tresa Endo RN [facility]

## 2022-06-13 NOTE — Progress Notes (Signed)
Patient is active with [facility] for RN and PT.

## 2022-06-14 DIAGNOSIS — I2510 Atherosclerotic heart disease of native coronary artery without angina pectoris: Secondary | ICD-10-CM | POA: Diagnosis not present

## 2022-06-14 DIAGNOSIS — D649 Anemia, unspecified: Secondary | ICD-10-CM | POA: Diagnosis not present

## 2022-06-14 DIAGNOSIS — I509 Heart failure, unspecified: Secondary | ICD-10-CM | POA: Diagnosis not present

## 2022-06-14 LAB — CBC
HCT: 25.6 % — ABNORMAL LOW (ref 39.0–52.0)
Hemoglobin: 7.8 g/dL — ABNORMAL LOW (ref 13.0–17.0)
MCH: 28.6 pg (ref 26.0–34.0)
MCHC: 30.5 g/dL (ref 30.0–36.0)
MCV: 93.8 fL (ref 80.0–100.0)
Platelets: 287 10*3/uL (ref 150–400)
RBC: 2.73 MIL/uL — ABNORMAL LOW (ref 4.22–5.81)
RDW: 15.9 % — ABNORMAL HIGH (ref 11.5–15.5)
WBC: 6.2 10*3/uL (ref 4.0–10.5)
nRBC: 0.3 % — ABNORMAL HIGH (ref 0.0–0.2)

## 2022-06-14 LAB — BASIC METABOLIC PANEL
Anion gap: 7 (ref 5–15)
BUN: 21 mg/dL (ref 8–23)
CO2: 25 mmol/L (ref 22–32)
Calcium: 8.3 mg/dL — ABNORMAL LOW (ref 8.9–10.3)
Chloride: 110 mmol/L (ref 98–111)
Creatinine, Ser: 1.59 mg/dL — ABNORMAL HIGH (ref 0.61–1.24)
GFR, Estimated: 43 mL/min — ABNORMAL LOW (ref >60)
Glucose, Bld: 155 mg/dL — ABNORMAL HIGH (ref 70–99)
Potassium: 4 mmol/L (ref 3.5–5.1)
Sodium: 142 mmol/L (ref 135–145)

## 2022-06-14 LAB — GLUCOSE, CAPILLARY
Glucose-Capillary: 142 mg/dL — ABNORMAL HIGH (ref 70–99)
Glucose-Capillary: 196 mg/dL — ABNORMAL HIGH (ref 70–99)
Glucose-Capillary: 237 mg/dL — ABNORMAL HIGH (ref 70–99)
Glucose-Capillary: 256 mg/dL — ABNORMAL HIGH (ref 70–99)

## 2022-06-14 LAB — CULTURE, BLOOD (SINGLE)

## 2022-06-14 LAB — MAGNESIUM: Magnesium: 2.1 mg/dL (ref 1.7–2.4)

## 2022-06-14 MED ORDER — IPRATROPIUM-ALBUTEROL 0.5-2.5 (3) MG/3ML IN SOLN
3.0000 mL | RESPIRATORY_TRACT | Status: DC
  Administered 2022-06-14 (×4): 3 mL via RESPIRATORY_TRACT
  Filled 2022-06-14 (×4): qty 3

## 2022-06-14 MED ORDER — IPRATROPIUM-ALBUTEROL 0.5-2.5 (3) MG/3ML IN SOLN: 3.0000 mL | Freq: Three times a day (TID) | RESPIRATORY_TRACT | Status: DC

## 2022-06-14 NOTE — Progress Notes (Signed)
. Progress Note   Gregory Tran: Gregory Gregory Tran Gregory Tran ZOX:[id number] DOB: Dec 21, 1935 DOA: 06/12/2022     2 DOS: Gregory Gregory Tran Gregory Tran was seen and examined on 06/14/2022   Brief hospital course: Gregory Gregory Tran Gregory Tran is a 87 y.o. male with medical history significant of CAD s/p DES, hypertension, type 2 diabetes, chronic anemia, hyperlipidemia, TIA, BPH, and recent admission for sepsis secondary to pneumonia, who presents to [facility] ED due to shortness of breath.   Gregory Gregory Tran Gregory Tran states that he was at physical therapy and laying down when he suddenly became short of breath.  Gregory Tran's son at bedside states that physical therapist checked his oxygen saturation at that time and it was in Gregory Gregory Tran mid 92s.  He was immediately sat up and oxygen saturation improved to 88%.  Due to this, Gregory Tran was brought to [facility] ED.  Gregory Gregory Tran Gregory Tran denies any worsening cough at home.  He is uncertain regarding shortness of breath, citing that he is quite sedentary.  He endorses gradually worsening lower extremity edema, stating his legs feel heavier.  He denies any chest pain or palpitations at this time.  He denies any fever, chills, nausea, vomiting, diarrhea, abdominal pain.  He notes that he completed his course of antibiotics after recent admission for pneumonia.   ED course:  On arrival to [facility] ED, Gregory Tran was hypertensive at 151/63 with heart rate of 71.  He was saturating at 98% on room air.  He was afebrile 97.8.  Initial workup notable for BNP of 787, troponin of 61, hemoglobin of 8.6, glucose of 116, creatinine of 1.23 with GFR 58.  Lactic acid within normal limits at 1.8.  COVID-19 and influenza PCR negative. CTA was subsequently obtained that demonstrated no evidence of PE, however progressive multifocal bilateral airspace disease concerning for worsening pneumonia, progressive lymphadenopathy, and bilateral pleural effusions that are new since prior exam.  Gregory Tran started on cefepime, vancomycin and IV fluids.  TRH contacted for  admission.  4/26 : Gregory Tran still symptomatic with shortness of breath with desaturation with minimal exertion.  Exam with bilateral wheeze and basal Rales.  Mild elevation in creatinine.   4/27 : Marked improvement in shortness of breath and lower extremity swelling.  Still has expiratory wheeze.  Gregory Tran started on scheduled nebulizer.  Continues to stay on 2 L nasal cannula.  Gregory Tran at -800 cc negative today.  Assessment and Plan:  * Acute heart failure  Gregory Tran is presenting with sudden shortness of breath and respiratory distress when laying flat at physical therapy.  History of gradually worsening lower extremity edema.  Notably hypervolemic on exam with 2+ pitting edema up to Gregory Gregory Tran abdomen. BNP is elevated at 787 with evidence of bilateral pleural effusions on CT.  Worsening airspace disease most likely represents edema rather than pneumonia.  Will get a procalcitonin to be thorough though.    - Telemetry monitoring - Echocardiogram ordered - Start Lasix 40 mg IV daily - Strict in and out, fluid restriction to 1.2 L. - Daily weights, daily BMP - No indication for thoracentesis at this time -Gregory Tran has remote history of cigarette smoke now with bilateral expiratory wheeze.  Start Gregory Gregory Tran Gregory Tran on nebulizer every 4 hours scheduled doses.  Coronary artery disease Per chart review, Gregory Tran has a history of CAD s/p previous stents.  Unable to find Gregory Gregory Tran Gregory Tran records though.  Troponin is currently elevated, however with a flat trend, most likely secondary to demand in Gregory Gregory Tran setting of heart failure.  No active chest pain at this time.  - Continue  aspirin and statin  Stage 3 chronic kidney disease  Renal function currently at baseline compared to prior admission.  Will monitor closely while diuresing.  - Daily BMP - Strict in and out - Monitor for urinary retention  Type 2 diabetes mellitus with peripheral neuropathy - Hold home antiglycemic agents - SSI moderate - Semglee 5 units at  bedtime  Chronic anemia Gradually worsening anemia, however hemoglobin stable at this time at 8.6.  No evidence of bleeding on examination  - Monitor CBC while admitted     Subjective: Gregory Tran seen and examined this morning.  Continues to be dramatic with respiratory distress on minimal exertion.  Exam evident of bilateral wheeze and rales.  Physical Exam: Vitals:   06/13/22 2339 06/14/22 0433 06/14/22 0748 06/14/22 1210  BP: (!) 143/66 (!) 138/58 (!) 146/63 (!) 147/59  Pulse: 84 79 81 (!) 104  Resp: 20 18 19 19   Temp: 97.6 F (36.4 C) 97.8 F (36.6 C) 98 F (36.7 C) (!) 97.5 F (36.4 C)  TempSrc: Oral Oral Oral Oral  SpO2: 96% 95% 100% 100%  Weight:  85.1 kg    Height:          Physical Exam HENT:     Head: Normocephalic.  Eyes:     Pupils: Pupils are equal, round, and reactive to light.  Cardiovascular:     Rate and Rhythm: Regular rhythm.  Pulmonary:     Effort: Tachypnea and respiratory distress present.     Comments: Bilateral expiratory wheeze with basal Rales Musculoskeletal:        General: Normal range of motion.     Comments: 2+ pitting edema bilaterally right more than left  Neurological:     General: No focal deficit present.     Mental Status: He is alert and oriented to person, place, and time. Mental status is at baseline.  Psychiatric:        Mood and Affect: Mood normal.        Behavior: Behavior normal.     Data Reviewed:  There are no new results to review at this time.  Family Communication: Son by bedside updated about Gregory Gregory Tran status. Disposition: Status is: Inpatient Remains inpatient appropriate because: CHF exacerbation  Planned Discharge Destination: Home    Time spent: 32 minutes  Author: Kirstie Peri, MD 06/14/2022 12:18 PM  For on call review [email or web address].

## 2022-06-15 ENCOUNTER — Inpatient Hospital Stay: Payer: Medicare Other

## 2022-06-15 DIAGNOSIS — I2510 Atherosclerotic heart disease of native coronary artery without angina pectoris: Secondary | ICD-10-CM | POA: Diagnosis not present

## 2022-06-15 DIAGNOSIS — I509 Heart failure, unspecified: Secondary | ICD-10-CM | POA: Diagnosis not present

## 2022-06-15 DIAGNOSIS — E1142 Type 2 diabetes mellitus with diabetic polyneuropathy: Secondary | ICD-10-CM | POA: Diagnosis not present

## 2022-06-15 LAB — PREPARE RBC (CROSSMATCH)

## 2022-06-15 LAB — COMPREHENSIVE METABOLIC PANEL
ALT: 20 U/L (ref 0–44)
AST: 20 U/L (ref 15–41)
Albumin: 2.8 g/dL — ABNORMAL LOW (ref 3.5–5.0)
Alkaline Phosphatase: 54 U/L (ref 38–126)
Anion gap: 5 (ref 5–15)
BUN: 24 mg/dL — ABNORMAL HIGH (ref 8–23)
CO2: 26 mmol/L (ref 22–32)
Calcium: 8.1 mg/dL — ABNORMAL LOW (ref 8.9–10.3)
Chloride: 106 mmol/L (ref 98–111)
Creatinine, Ser: 1.76 mg/dL — ABNORMAL HIGH (ref 0.61–1.24)
GFR, Estimated: 38 mL/min — ABNORMAL LOW (ref >60)
Glucose, Bld: 366 mg/dL — ABNORMAL HIGH (ref 70–99)
Potassium: 3.9 mmol/L (ref 3.5–5.1)
Sodium: 137 mmol/L (ref 135–145)
Total Bilirubin: 0.6 mg/dL (ref 0.3–1.2)
Total Protein: 6.2 g/dL — ABNORMAL LOW (ref 6.5–8.1)

## 2022-06-15 LAB — CBC
HCT: 25.2 % — ABNORMAL LOW (ref 39.0–52.0)
Hemoglobin: 7.5 g/dL — ABNORMAL LOW (ref 13.0–17.0)
MCH: 27.8 pg (ref 26.0–34.0)
MCHC: 29.8 g/dL — ABNORMAL LOW (ref 30.0–36.0)
MCV: 93.3 fL (ref 80.0–100.0)
Platelets: 274 10*3/uL (ref 150–400)
RBC: 2.7 MIL/uL — ABNORMAL LOW (ref 4.22–5.81)
RDW: 16 % — ABNORMAL HIGH (ref 11.5–15.5)
WBC: 5.7 10*3/uL (ref 4.0–10.5)
nRBC: 0 % (ref 0.0–0.2)

## 2022-06-15 LAB — TYPE AND SCREEN

## 2022-06-15 LAB — ABO/RH: ABO/RH(D): O POS

## 2022-06-15 LAB — BLOOD GAS, ARTERIAL: Acid-Base Excess: 4.7 mmol/L — ABNORMAL HIGH (ref 0.0–2.0)

## 2022-06-15 LAB — BPAM RBC
Blood Product Expiration Date: 202405032359
ISSUE DATE / TIME: 202404280525

## 2022-06-15 LAB — GLUCOSE, CAPILLARY
Glucose-Capillary: 317 mg/dL — ABNORMAL HIGH (ref 70–99)
Glucose-Capillary: 252 mg/dL — ABNORMAL HIGH (ref 70–99)
Glucose-Capillary: 225 mg/dL — ABNORMAL HIGH (ref 70–99)
Glucose-Capillary: 228 mg/dL — ABNORMAL HIGH (ref 70–99)

## 2022-06-15 LAB — MAGNESIUM: Magnesium: 2 mg/dL (ref 1.7–2.4)

## 2022-06-15 MED ORDER — GUAIFENESIN 100 MG/5ML PO LIQD
20.0000 mL | ORAL | Status: DC
  Administered 2022-06-15 – 2022-06-19 (×20): 20 mL via ORAL
  Filled 2022-06-15 (×18): qty 20

## 2022-06-15 MED ORDER — FUROSEMIDE 10 MG/ML IJ SOLN
60.0000 mg | Freq: Once | INTRAMUSCULAR | Status: AC
  Administered 2022-06-15: 60 mg via INTRAVENOUS
  Filled 2022-06-15: qty 6

## 2022-06-15 MED ORDER — IPRATROPIUM-ALBUTEROL 0.5-2.5 (3) MG/3ML IN SOLN
3.0000 mL | RESPIRATORY_TRACT | Status: DC | PRN
  Administered 2022-06-15: 3 mL via RESPIRATORY_TRACT
  Filled 2022-06-15: qty 3

## 2022-06-15 MED ORDER — IPRATROPIUM-ALBUTEROL 0.5-2.5 (3) MG/3ML IN SOLN
3.0000 mL | Freq: Four times a day (QID) | RESPIRATORY_TRACT | Status: DC
  Administered 2022-06-15 – 2022-06-16 (×7): 3 mL via RESPIRATORY_TRACT
  Filled 2022-06-15 (×7): qty 3

## 2022-06-15 MED ORDER — FUROSEMIDE 10 MG/ML IJ SOLN: 40.0000 mg | Freq: Every day | INTRAMUSCULAR | Status: DC

## 2022-06-15 MED ORDER — SODIUM CHLORIDE 0.9% IV SOLUTION: Freq: Once | INTRAVENOUS | Status: AC

## 2022-06-15 MED ORDER — PHENOL 1.4 % MT LIQD
2.0000 | OROMUCOSAL | Status: DC | PRN
  Filled 2022-06-15: qty 177

## 2022-06-15 MED ORDER — LEVOFLOXACIN IN D5W 750 MG/150ML IV SOLN
750.0000 mg | INTRAVENOUS | Status: DC
  Administered 2022-06-15: 750 mg via INTRAVENOUS
  Filled 2022-06-15 (×2): qty 150

## 2022-06-15 MED ORDER — FUROSEMIDE 10 MG/ML IJ SOLN
60.0000 mg | INTRAMUSCULAR | Status: AC
  Administered 2022-06-15: 60 mg via INTRAVENOUS
  Filled 2022-06-15: qty 6

## 2022-06-15 NOTE — Progress Notes (Addendum)
       CROSS COVER NOTE  NAME: Gregory Tran MRN: [id number] DOB : [birth year]    HPI/Events of Note   Report:shortness of breath unrelieved from additional ebulizer treatment  On review of chart: history of /cad (angioplasty 2001, 2008) and CKD 3. Chronic iron deficiency anemia admitted with new heart failure Since admit decrease hgb now 7.5 from 8.6 Creatinine demonstrates AKI 1.76, was 1.23 on admit. Baseline appears to be 1.3-1.4    Assessment and  Interventions   Assessment: Alert, increased shortness of breath with talking Rales rhonchi with wet cough noted Plan: Goal hgb with CAD and new heart failure should be 8, currently 7.5 - discussed benefits of PRBC transfusion, patient consented - give 1 unit PRBC 60 IV furosemide 30 min after PRBC transfusion begins BIPAP prn work of breathing Oxygen therapy keep sats above 92 Follow up on adherence to iron therapy       Donnie Mesa NP [facility]

## 2022-06-15 NOTE — Progress Notes (Signed)
.. Progress Note   Patient: Gregory Tran ZOX:[id number] DOB: 04/01/1935 DOA: 06/12/2022     3 DOS: the patient was seen and examined on 06/15/2022   Brief hospital course: Gregory Tran is a 87 y.o. male with medical history significant of CAD s/p DES, hypertension, type 2 diabetes, chronic anemia, hyperlipidemia, TIA, BPH, and recent admission for sepsis secondary to pneumonia, who presents to the ED due to shortness of breath.   Gregory Tran states that he was at physical therapy and laying down when he suddenly became short of breath.  Patient's son at bedside states that physical therapist checked his oxygen saturation at that time and it was in the mid 24s.  He was immediately sat up and oxygen saturation improved to 88%.  Due to this, patient was brought to the ED.  Gregory Tran denies any worsening cough at home.  He is uncertain regarding shortness of breath, citing that he is quite sedentary.  He endorses gradually worsening lower extremity edema, stating his legs feel heavier.  He denies any chest pain or palpitations at this time.  He denies any fever, chills, nausea, vomiting, diarrhea, abdominal pain.  He notes that he completed his course of antibiotics after recent admission for pneumonia.   ED course:  On arrival to the ED, patient was hypertensive at 151/63 with heart rate of 71.  He was saturating at 98% on room air.  He was afebrile 97.8.  Initial workup notable for BNP of 787, troponin of 61, hemoglobin of 8.6, glucose of 116, creatinine of 1.23 with GFR 58.  Lactic acid within normal limits at 1.8.  COVID-19 and influenza PCR negative. CTA was subsequently obtained that demonstrated no evidence of PE, however progressive multifocal bilateral airspace disease concerning for worsening pneumonia, progressive lymphadenopathy, and bilateral pleural effusions that are new since prior exam.  Patient started on cefepime, vancomycin and IV fluids.  TRH contacted for  admission.  4/26 : Patient still symptomatic with shortness of breath with desaturation with minimal exertion.  Exam with bilateral wheeze and basal Rales.  Mild elevation in creatinine.   4/27 : Marked improvement in shortness of breath and lower extremity swelling.  Still has expiratory wheeze.  Patient started on scheduled nebulizer.  Continues to stay on 2 L nasal cannula.  Patient at -800 cc negative today.  4/27: Patient was hypoxic overnight symptoms were attributed to CHF, fluid overload and anemia.  Patient underwent 1 unit of blood transfusion.  Received 60 mg IV Lasix.  Had mild worsening of renal functions.  Repeated chest x-ray which showed interstitial fluid and right-sided focal pulmonary edema/pneumonia.  Patient empirically started on levofloxacin 500 mg IV daily.  Will additionally dose Lasix IV 40 mg already received Lasix 60 mg in the morning.  Added Robitussin for mucolysis.  Patient was placed on BiPAP for ABG finding of hypercapnia and mild hypoxia.  Assessment and Plan:  * Acute heart failure  Patient is presenting with sudden shortness of breath and respiratory distress when laying flat at physical therapy.  History of gradually worsening lower extremity edema.  Notably hypervolemic on exam with 2+ pitting edema up to the abdomen. BNP is elevated at 787 with evidence of bilateral pleural effusions on CT.  Worsening airspace disease most likely represents edema rather than pneumonia.  Will get a procalcitonin to be thorough though.    - Telemetry monitoring - Echocardiogram ordered - Start Lasix 40 mg IV daily - Strict in and out, fluid restriction  to 1.2 L. - Daily weights, daily BMP - No indication for thoracentesis at this time -Patient has remote history of cigarette smoke now with bilateral expiratory wheeze.  Start the patient on nebulizer every 4 hours scheduled doses.  Coronary artery disease Per chart review, patient has a history of CAD s/p previous stents.   Unable to find [facility] records though.  Troponin is currently elevated, however with a flat trend, most likely secondary to demand in the setting of heart failure.  No active chest pain at this time.  - Continue aspirin and statin  Stage 3 chronic kidney disease  Renal function currently at baseline compared to prior admission.  Will monitor closely while diuresing.  - Daily BMP - Strict in and out - Monitor for urinary retention  Type 2 diabetes mellitus with peripheral neuropathy - Hold home antiglycemic agents - SSI moderate - Semglee 5 units at bedtime  Chronic anemia Gradually worsening anemia, however hemoglobin stable at this time at 8.6.  No evidence of bleeding on examination  - Monitor CBC while admitted     Subjective: Patient seen and examined this morning.  Had a setback.  Patient in mild to moderate respiratory distress at the time of my evaluation.  Somnolent.  Had drop in H&H with hemoglobin of 7.5 and with no overt/occult bleeding.  Received 1 unit of PRBC overnight , and Lasix after transfusion.   Physical Exam: Vitals:   06/15/22 0735 06/15/22 0836 06/15/22 1150 06/15/22 1212  BP:  139/65 121/63   Pulse:  88 72   Resp:  20 18   Temp:  98.1 F (36.7 C) 98.2 F (36.8 C)   TempSrc:  Oral Oral   SpO2: 95% 96% 97% 98%  Weight:      Height:          Physical Exam HENT:     Head: Normocephalic.  Eyes:     Pupils: Pupils are equal, round, and reactive to light.  Cardiovascular:     Rate and Rhythm: Regular rhythm.  Pulmonary:     Effort: Tachypnea and respiratory distress present.     Comments: Bilateral expiratory wheeze with basal Rales with rhonchorous breath sounds in the upper bronchial area.  Patient on BiPAP Musculoskeletal:        General: Normal range of motion.     Comments: 2+ pitting edema bilaterally right more than left  Neurological:     General: No focal deficit present.     Mental Status: He is alert and oriented to person, place, and  time. Mental status is at baseline.  Psychiatric:        Mood and Affect: Mood normal.        Behavior: Behavior normal.     Data Reviewed:  There are no new results to review at this time.  Family Communication: Son by bedside updated about the status. Disposition: Status is: Inpatient Remains inpatient appropriate because: CHF exacerbation  Planned Discharge Destination: Home    Time spent: 32 minutes  Author: Kirstie Peri, MD 06/15/2022 2:01 PM  For on call review [email or web address].

## 2022-06-15 NOTE — Progress Notes (Signed)
Patient called out around 0145 c/o shortness of breath. Scheduled breathing treatment was given at 2327. On assessment, SpO2 was 95% on 2L w/ diminished breath sounds and O2 increased to 3L for comfort. Notified NP Jon Billings and PRN breathing treatments ordered w/ dose given at 0207. Patient still c/o shortness of breath after treatment and expiratory wheezes noted on auscultation. Notified NP Jon Billings of changes and lab notified per NP request to draw AM labs early. Per NP, plan for blood transfusion, consent signed.  Lamonte Richer, RN

## 2022-06-16 ENCOUNTER — Inpatient Hospital Stay (HOSPITAL_COMMUNITY)
Admit: 2022-06-16 | Discharge: 2022-06-16 | Disposition: A | Discharge status: Home / Self Care | Payer: Medicare Other | Attending: Internal Medicine | Admitting: Internal Medicine

## 2022-06-16 DIAGNOSIS — I509 Heart failure, unspecified: Secondary | ICD-10-CM | POA: Diagnosis not present

## 2022-06-16 DIAGNOSIS — I2510 Atherosclerotic heart disease of native coronary artery without angina pectoris: Secondary | ICD-10-CM | POA: Diagnosis not present

## 2022-06-16 DIAGNOSIS — E1142 Type 2 diabetes mellitus with diabetic polyneuropathy: Secondary | ICD-10-CM | POA: Diagnosis not present

## 2022-06-16 DIAGNOSIS — N1831 Chronic kidney disease, stage 3a: Secondary | ICD-10-CM | POA: Diagnosis not present

## 2022-06-16 LAB — CBC
HCT: 27.7 % — ABNORMAL LOW (ref 39.0–52.0)
Hemoglobin: 8.5 g/dL — ABNORMAL LOW (ref 13.0–17.0)
MCH: 28.4 pg (ref 26.0–34.0)
MCHC: 30.7 g/dL (ref 30.0–36.0)
MCV: 92.6 fL (ref 80.0–100.0)
Platelets: 262 10*3/uL (ref 150–400)
RBC: 2.99 MIL/uL — ABNORMAL LOW (ref 4.22–5.81)
RDW: 15.8 % — ABNORMAL HIGH (ref 11.5–15.5)
WBC: 6.8 10*3/uL (ref 4.0–10.5)
nRBC: 0 % (ref 0.0–0.2)

## 2022-06-16 LAB — COMPREHENSIVE METABOLIC PANEL
ALT: 19 U/L (ref 0–44)
AST: 27 U/L (ref 15–41)
Albumin: 2.7 g/dL — ABNORMAL LOW (ref 3.5–5.0)
Alkaline Phosphatase: 54 U/L (ref 38–126)
Anion gap: 10 (ref 5–15)
BUN: 20 mg/dL (ref 8–23)
CO2: 27 mmol/L (ref 22–32)
Calcium: 8.2 mg/dL — ABNORMAL LOW (ref 8.9–10.3)
Chloride: 103 mmol/L (ref 98–111)
Creatinine, Ser: 1.31 mg/dL — ABNORMAL HIGH (ref 0.61–1.24)
GFR, Estimated: 54 mL/min — ABNORMAL LOW (ref >60)
Glucose, Bld: 222 mg/dL — ABNORMAL HIGH (ref 70–99)
Potassium: 3.6 mmol/L (ref 3.5–5.1)
Sodium: 140 mmol/L (ref 135–145)
Total Bilirubin: 0.8 mg/dL (ref 0.3–1.2)
Total Protein: 6.1 g/dL — ABNORMAL LOW (ref 6.5–8.1)

## 2022-06-16 LAB — MAGNESIUM: Magnesium: 1.9 mg/dL (ref 1.7–2.4)

## 2022-06-16 LAB — TYPE AND SCREEN
ABO/RH(D): O POS
Antibody Screen: NEGATIVE
Unit division: 0

## 2022-06-16 LAB — GLUCOSE, CAPILLARY
Glucose-Capillary: 221 mg/dL — ABNORMAL HIGH (ref 70–99)
Glucose-Capillary: 307 mg/dL — ABNORMAL HIGH (ref 70–99)
Glucose-Capillary: 249 mg/dL — ABNORMAL HIGH (ref 70–99)

## 2022-06-16 LAB — ECHOCARDIOGRAM COMPLETE
AR max vel: 2.57 cm2
AV Area VTI: 2.5 cm2
AV Area mean vel: 2.32 cm2
AV Mean grad: 4 mmHg
AV Peak grad: 7 mmHg
Ao pk vel: 1.32 m/s
Area-P 1/2: 4.74 cm2
Height: 68 in
MV VTI: 2.39 cm2
S' Lateral: 3.7 cm

## 2022-06-16 LAB — BPAM RBC: Unit Type and Rh: 5100

## 2022-06-16 LAB — CULTURE, BLOOD (SINGLE)

## 2022-06-16 MED ORDER — PERFLUTREN LIPID MICROSPHERE
1.0000 mL | INTRAVENOUS | Status: AC | PRN
  Administered 2022-06-16: 5 mL via INTRAVENOUS

## 2022-06-16 MED ORDER — INSULIN GLARGINE-YFGN 100 UNIT/ML ~~LOC~~ SOLN
10.0000 [IU] | Freq: Every day | SUBCUTANEOUS | Status: DC
  Administered 2022-06-16 – 2022-06-17 (×2): 10 [IU] via SUBCUTANEOUS
  Filled 2022-06-16 (×3): qty 0.1

## 2022-06-16 MED ORDER — FUROSEMIDE 10 MG/ML IJ SOLN
60.0000 mg | Freq: Two times a day (BID) | INTRAMUSCULAR | Status: DC
  Administered 2022-06-16 – 2022-06-17 (×4): 60 mg via INTRAVENOUS
  Filled 2022-06-16 (×4): qty 6

## 2022-06-16 MED ORDER — POTASSIUM CHLORIDE 20 MEQ PO PACK
40.0000 meq | PACK | Freq: Two times a day (BID) | ORAL | Status: AC
  Administered 2022-06-16 (×2): 40 meq via ORAL
  Filled 2022-06-16 (×2): qty 2

## 2022-06-16 MED ORDER — HYDROCERIN EX CREA
TOPICAL_CREAM | Freq: Two times a day (BID) | CUTANEOUS | Status: DC
  Filled 2022-06-16 (×2): qty 113

## 2022-06-16 NOTE — Care Management Important Message (Signed)
Important Message  Patient Details  Name: Gregory Tran MRN: [id number] Date of Birth: Jan 14, 1936   Medicare Important Message Given:  Yes     [name] 06/16/2022, 12:13 PM

## 2022-06-16 NOTE — Plan of Care (Signed)

## 2022-06-16 NOTE — Progress Notes (Signed)
PT Cancellation Note  Patient Details Name: Gregory Tran MRN: [id number] DOB: 08-Feb-1936   Cancelled Treatment:    Reason Eval/Treat Not Completed: Medical issues which prohibited therapy.  Nurse reports pt currently on BiPap d/t breathing concerns.  Will hold therapy at this time and re-attempt PT session at a later date/time as medically appropriate.  Hendricks Limes, PT 06/16/22, 10:10 AM

## 2022-06-16 NOTE — Consult Note (Signed)
[facility] CLINIC CARDIOLOGY CONSULT NOTE       Patient ID: Gregory Tran MRN: [id number] DOB/AGE: 23-Apr-1935 87 y.o.  Admit date: 06/12/2022 Referring Physician Dr. Joycelyn Das  Primary Physician Lenon Oms, NP  Primary Cardiologist previous Dr. Lady Gary  Reason for Consultation new HF  HPI: Gregory Tran. Tant is an 76yoM with a PMH of CAD s/p RCA and LAD stents (patent by [facility] 2018), DM2, HTN, chronic anemia, hx TIA with recent admission for sepsis secondary to PNA who presented to [facility] ED 06/12/2022 with shortness of breath while working with physical therapy.  There was initial concern for pneumonia based on CTA chest findings, but also had significant peripheral edema with concern for heart failure contributing to his symptoms.  Cardiology is consulted for hospital day 4 for assistance with his CHF.  History as per obtained primarily from the patient's chart as the patient is on BiPAP and he is somewhat of a limited historian.  He was recently admitted to [facility] for aspiration pneumonia from 05/21/2022 to 05/25/2022.  He was working with physical therapy late last week and complained of shortness of breath while laying flat and was hypoxic while supine, oxygenation improved while he was sitting upright.  In the emergency department his blood pressure was reportedly elevated, BNP was also elevated at 787, COVID, influenza, and RSV PCR was negative.  CTA chest obtained was without pulmonary embolus but did reveal progressive multifocal bilateral airspace disease C/F worsening pneumonia, progressive lymphadenopathy, and bilateral pleural effusions that were new compared to exam earlier this month.  He was started on broad-spectrum antibiotics and also given IV fluids.  Patient also had worsening anemia with hemoglobin drifted down to 7.5, and received 1 unit of PRBCs overnight on 4/28.  Hemoglobin this morning with appropriate increase to 8.5.  At my time of evaluation the patient is sitting upright  and was recently placed on BiPAP again after a trial off of it with nasal cannula oxygen this morning.  He normally wears BiPAP overnight while sleeping.  ROS is limited as patient's speech is difficult to understand and he tells me to talk to his son, who is unfortunately not presently at the bedside.  Review of systems complete and found to be negative unless listed above     Past Medical History:  Diagnosis Date   Arthritis    BPH (benign prostatic hyperplasia)    Cancer (HCC)    Basal Cell   Chronic kidney disease    Coronary artery disease    Diabetes mellitus (HCC)    Ejaculatory disorder    Erectile dysfunction    Frequency    GERD (gastroesophageal reflux disease)    Gross hematuria    Heart disease    Hematuria    HTN (hypertension)    Hyperlipidemia    Incomplete bladder emptying    Microscopic hematuria    Myocardial infarction (HCC)    Neuropathy    Nocturia    Rotator cuff tear left   TIA (transient ischemic attack)    Tick bite of multiple sites 5 days ago   chest, and groin area   Vertigo    1-2x/yr    Past Surgical History:  Procedure Laterality Date   cardiac stents     CATARACT EXTRACTION W/PHACO Left 08/02/2018   Procedure: CATARACT EXTRACTION PHACO AND INTRAOCULAR LENS PLACEMENT (IOC)  LEFT DIABETIC;  Surgeon: Nevada Crane, MD;  Location: [facility];  Service: Ophthalmology;  Laterality: Left;  diabetes - insulin  and oral meds   CATARACT EXTRACTION W/PHACO Right 10/11/2018   Procedure: CATARACT EXTRACTION PHACO AND INTRAOCULAR LENS PLACEMENT (IOC) RIGHT DIABETES;  Surgeon: Nevada Crane, MD;  Location: [facility];  Service: Ophthalmology;  Laterality: Right;  Diabetic - insulin and oral meds   CORONARY ANGIOPLASTY  2001, 2008   [facility]   DUPUYTREN CONTRACTURE RELEASE Right 04/30/2016   Procedure: DUPUYTREN CONTRACTURE RELEASE;  Surgeon: Erin Sons, MD;  Location: [facility] ORS;  Service: Orthopedics;  Laterality: Right;    KYPHOPLASTY N/A 01/26/2018   Procedure: Pearletha Furl;  Surgeon: Kennedy Bucker, MD;  Location: [facility] ORS;  Service: Orthopedics;  Laterality: N/A;   ROTATOR CUFF REPAIR Bilateral    TRANSURETHRAL RESECTION OF PROSTATE N/A 07/09/2015   Procedure: TRANSURETHRAL RESECTION OF THE PROSTATE (TURP);  Surgeon: Vanna Scotland, MD;  Location: [facility] ORS;  Service: Urology;  Laterality: N/A;    Medications Prior to Admission  Medication Sig Dispense Refill Last Dose   amLODipine (NORVASC) 10 MG tablet Take 5 mg by mouth daily.   06/12/2022   ammonium lactate (AMLACTIN) 12 % cream Apply 1 Application topically 2 (two) times daily. (Apply to back)   Past Week   aspirin EC 81 MG tablet Take 81 mg by mouth daily.    06/12/2022   diphenhydrAMINE (BENADRYL) 25 mg capsule Take 1 capsule (25 mg total) by mouth every 6 (six) hours as needed for itching, sleep or allergies. 30 capsule 0 unknown   gabapentin (NEURONTIN) 100 MG capsule Take 100 mg by mouth daily.   06/12/2022   insulin aspart (NOVOLOG) 100 UNIT/ML FlexPen Inject 3 Units into the skin 3 (three) times daily with meals. 15 mL 3 06/12/2022   insulin degludec (TRESIBA) 100 UNIT/ML FlexTouch Pen Inject 10 Units into the skin daily.   06/12/2022   metFORMIN (GLUCOPHAGE) 1000 MG tablet Take 1,000 mg by mouth daily.   06/12/2022   metoprolol succinate (TOPROL-XL) 25 MG 24 hr tablet Take 12.5 mg by mouth daily.   06/12/2022   pantoprazole (PROTONIX) 40 MG tablet Take 40 mg by mouth daily.    06/12/2022   rosuvastatin (CRESTOR) 5 MG tablet Take 5 mg by mouth every other day.   1 Past Week   tamsulosin (FLOMAX) 0.4 MG CAPS capsule TAKE 1 CAPSULE(0.4 MG) BY MOUTH DAILY (Patient taking differently: Take 0.4 mg by mouth daily.) 30 capsule 3    Social History   Socioeconomic History   Marital status: Single    Spouse name: Not on file   Number of children: Not on file   Years of education: Not on file   Highest education level: Not on file  Occupational History    Not on file  Tobacco Use   Smoking status: Former    Packs/day: 1.00    Years: 50.00    Additional pack years: 0.00    Total pack years: 50.00    Types: Cigarettes    Quit date: 06/26/2017    Years since quitting: 4.9   Smokeless tobacco: Never  Vaping Use   Vaping Use: Never used  Substance and Sexual Activity   Alcohol use: No    Alcohol/week: 0.0 standard drinks of alcohol   Drug use: No   Sexual activity: Not on file  Other Topics Concern   Not on file  Social History Narrative   Not on file   Social Determinants of Health   Financial Resource Strain: Not on file  Food Insecurity: No Food Insecurity (05/22/2022)   Hunger Vital  Sign    Worried About Programme researcher, broadcasting/film/video in the Last Year: Never true    Ran Out of Food in the Last Year: Never true  Transportation Needs: No Transportation Needs (05/22/2022)   PRAPARE - Administrator, Civil Service (Medical): No    Lack of Transportation (Non-Medical): No  Physical Activity: Not on file  Stress: Not on file  Social Connections: Not on file  Intimate Partner Violence: Not At Risk (05/22/2022)   Humiliation, Afraid, Rape, and Kick questionnaire    Fear of Current or Ex-Partner: No    Emotionally Abused: No    Physically Abused: No    Sexually Abused: No    Family History  Problem Relation Age of Onset   Ovarian cancer Sister    Kidney disease Brother        born one kidney   Bladder Cancer Neg Hx    Prostate cancer Neg Hx       Intake/Output Summary (Last 24 hours) at 06/16/2022 1113 Last data filed at 06/16/2022 0328 Gross per 24 hour  Intake 630 ml  Output 2300 ml  Net -1670 ml    Vitals:   06/16/22 0400 06/16/22 0411 06/16/22 0833 06/16/22 0846  BP:  (!) 138/55 (!) 150/75   Pulse:  71 (!) 101 100  Resp: 10  20   Temp:  97.9 F (36.6 C) 98.2 F (36.8 C)   TempSrc:  Oral Oral   SpO2:  97% 94% 96%  Weight:  86.4 kg    Height:        PHYSICAL EXAM General: Elderly and frail-appearing  Caucasian male, in no acute distress.  Sitting upright in bed on BiPAP without family at bedside. HEENT:  Normocephalic and atraumatic. Neck:  No JVD.  Lungs: Normal respiratory effort on BiPAP.  Coarse breath sounds auscultation anteriorly without appreciable wheezes Heart: HRRR . Normal S1 and S2 without gallops or murmurs.  Abdomen: Non-distended appearing.  Msk: Normal strength and tone for age. Extremities: Warm and well perfused. No clubbing, cyanosis.  Trace bilateral lower extremity edema with skin dimpling and wrinkling.  Neuro: fatigued, falls asleep mid interview Psych:  Answers questions appropriately, but is a limited historian   Labs: Basic Metabolic Panel: Recent Labs    06/15/22 0307 06/16/22 0511  NA 137 140  K 3.9 3.6  CL 106 103  CO2 26 27  GLUCOSE 366* 222*  BUN 24* 20  CREATININE 1.76* 1.31*  CALCIUM 8.1* 8.2*  MG 2.0 1.9   Liver Function Tests: Recent Labs    06/15/22 0307 06/16/22 0511  AST 20 27  ALT 20 19  ALKPHOS 54 54  BILITOT 0.6 0.8  PROT 6.2* 6.1*  ALBUMIN 2.8* 2.7*   No results for input(s): "LIPASE", "AMYLASE" in the last 72 hours. CBC: Recent Labs    06/15/22 0307 06/16/22 0511  WBC 5.7 6.8  HGB 7.5* 8.5*  HCT 25.2* 27.7*  MCV 93.3 92.6  PLT 274 262   Cardiac Enzymes: No results for input(s): "CKTOTAL", "CKMB", "CKMBINDEX", "TROPONINIHS" in the last 72 hours. BNP: No results for input(s): "BNP" in the last 72 hours. D-Dimer: No results for input(s): "DDIMER" in the last 72 hours. Hemoglobin A1C: No results for input(s): "HGBA1C" in the last 72 hours. Fasting Lipid Panel: No results for input(s): "CHOL", "HDL", "LDLCALC", "TRIG", "CHOLHDL", "LDLDIRECT" in the last 72 hours. Thyroid Function Tests: No results for input(s): "TSH", "T4TOTAL", "T3FREE", "THYROIDAB" in the last 72 hours.  Invalid  input(s): "FREET3" Anemia Panel: No results for input(s): "VITAMINB12", "FOLATE", "FERRITIN", "TIBC", "IRON", "RETICCTPCT" in the  last 72 hours.   Radiology: [facility] Chest Port 1 View  Result Date: 06/15/2022 CLINICAL DATA:  Increased shortness of breath. EXAM: PORTABLE CHEST 1 VIEW COMPARISON:  Radiograph 05/21/2022. Chest CT 06/12/2022 FINDINGS: The heart is enlarged. Bilateral pleural effusions, right greater than left. Developing right left perihilar opacity. Worsening pulmonary edema with increasing septal thickening and diffuse pulmonary opacity. No pneumothorax. IMPRESSION: 1. CHF with cardiomegaly, increasing pulmonary edema, and bilateral pleural effusions. 2. Developing right perihilar opacity, may be asymmetric pulmonary edema or pneumonia. Electronically Signed   By: Narda Rutherford M.D.   On: 06/15/2022 13:46   CT Angio Chest PE W and/or Wo Contrast  Result Date: 06/12/2022 CLINICAL DATA:  Short of breath, productive cough EXAM: CT ANGIOGRAPHY CHEST WITH CONTRAST TECHNIQUE: Multidetector CT imaging of the chest was performed using the standard protocol during bolus administration of intravenous contrast. Multiplanar CT image reconstructions and MIPs were obtained to evaluate the vascular anatomy. RADIATION DOSE REDUCTION: This exam was performed according to the departmental dose-optimization program which includes automated exposure control, adjustment of the mA and/or kV according to patient size and/or use of iterative reconstruction technique. CONTRAST:  75mL OMNIPAQUE IOHEXOL 350 MG/ML SOLN COMPARISON:  05/21/2022 FINDINGS: Cardiovascular: This is a technically adequate evaluation of the pulmonary vasculature. No filling defects or pulmonary emboli. The heart is unremarkable without pericardial effusion. There is dense atherosclerosis of the coronary vasculature greatest in the LAD and right coronary distribution. No evidence of thoracic aortic aneurysm or dissection. Stable atherosclerosis of the aorta. Mediastinum/Nodes: Stable appearance of the thyroid. Mucoid material layers dependently within the bilateral mainstem  bronchi. The esophagus is unremarkable. Mild bilateral axillary lymphadenopathy has progressed since prior study. Index lymph node adjacent to the right subclavian vein reference image 39/4 measures 11 mm, previously measuring 8 mm. Multiple subcentimeter mediastinal and hilar lymph nodes are again noted. Lungs/Pleura: Interval development of bilateral pleural effusions, volume estimated less than 1 L each. Multifocal bilateral ground-glass airspace disease has progressed, right greater than left. There is bilateral bronchial wall thickening. No pneumothorax. Upper Abdomen: No acute abnormality. Musculoskeletal: No acute or destructive bony lesions. Reconstructed images demonstrate no additional findings. Review of the MIP images confirms the above findings. IMPRESSION: 1. No evidence of pulmonary embolus. 2. Progressive multifocal bilateral airspace disease and bronchial wall thickening, consistent with worsening pneumonia. 3. Progressive mediastinal, hilar, and axillary lymphadenopathy, likely reactive. 4. Bilateral pleural effusions, new since prior exam, volume estimated less than 1 L each. 5. Aortic Atherosclerosis (ICD10-I70.0). Coronary artery atherosclerosis. Electronically Signed   By: Sharlet Salina M.D.   On: 06/12/2022 16:09   CT CHEST ABDOMEN PELVIS WO CONTRAST  Result Date: 05/21/2022 CLINICAL DATA:  Sepsis, fever, hyperglycemia EXAM: CT CHEST, ABDOMEN AND PELVIS WITHOUT CONTRAST TECHNIQUE: Multidetector CT imaging of the chest, abdomen and pelvis was performed following the standard protocol without IV contrast. RADIATION DOSE REDUCTION: This exam was performed according to the departmental dose-optimization program which includes automated exposure control, adjustment of the mA and/or kV according to patient size and/or use of iterative reconstruction technique. COMPARISON:  None Available. FINDINGS: CT CHEST FINDINGS Cardiovascular: Extensive multi-vessel coronary artery calcification. Cardiac  size within normal limits. No pericardial effusion. Central pulmonary arteries are of normal caliber. Extensive atherosclerotic calcification within the thoracic aorta. Mediastinum/Nodes: The visualized thyroid is unremarkable. No pathologic thoracic adenopathy. The distal esophagus appears mildly circumferentially thick walled small amount of fluid  is seen layering within the mid esophagus which may reflect changes of esophagitis in the setting of gastroesophageal reflux or esophageal dysmotility. This is not well assessed on this examination. Lungs/Pleura: Reticulonodular infiltrates have progressed within the lung bases bilaterally in keeping with atypical infection or aspiration. Trace superimposed interstitial pulmonary edema and bilateral pleural effusions are present. No pneumothorax. No central obstructing lesion. Bronchial wall thickening in keeping with airway inflammation noted. Musculoskeletal: No acute bone abnormality. No lytic or blastic bone lesion. CT ABDOMEN PELVIS FINDINGS Hepatobiliary: No focal liver abnormality is seen. No gallstones, gallbladder wall thickening, or biliary dilatation. Pancreas: Unremarkable Spleen: Unremarkable Adrenals/Urinary Tract: The adrenal glands are unremarkable. The kidneys are normal in size and position. Vascular calcifications noted within the renal hila bilaterally. No definite urinary renal or ureteral calculi. The bladder is distended but is otherwise unremarkable. Stomach/Bowel: Moderate colonic stool burden without evidence of obstruction. Stomach, small bowel, and large bowel are otherwise unremarkable. Appendix normal. No free intraperitoneal gas or fluid. Vascular/Lymphatic: Extensive aortoiliac atherosclerotic calcification. Particularly prominent atherosclerotic calcification noted at the origin of the superior mesenteric artery, however, the degree of stenosis is not well assessed on this noncontrast examination. Extensive calcification also noted within  the lower extremity arterial inflow bilaterally. No aortic aneurysm. No pathologic adenopathy within the abdomen and pelvis. Reproductive: Moderate prostatic hypertrophy. Other: No abdominal wall hernia. Musculoskeletal: L4 and L5 vertebral augmentation has been performed. Osseous structures are otherwise age-appropriate. No acute bone abnormality. No lytic or blastic bone lesion. IMPRESSION: 1. Progressive reticulonodular infiltrates within the lung bases bilaterally in keeping with atypical infection or aspiration. 2. Trace superimposed interstitial pulmonary edema and bilateral pleural effusions. 3. Extensive multi-vessel coronary artery calcification. 4. Mild circumferentially thick walled distal esophagus which may reflect changes of esophagitis in the setting of gastroesophageal reflux or esophageal dysmotility. Correlation with endoscopy may be helpful for further evaluation. 5. Moderate colonic stool burden without evidence of obstruction. 6. Extensive aortoiliac atherosclerotic calcification. Particularly prominent atherosclerotic calcification at the origin of the superior mesenteric artery, however, the degree of stenosis is not well assessed on this noncontrast examination. If there is clinical evidence of chronic mesenteric ischemia, CT arteriography may be helpful for further evaluation. 7. Moderate prostatic hypertrophy. Aortic Atherosclerosis (ICD10-I70.0). Electronically Signed   By: Helyn Numbers M.D.   On: 05/21/2022 23:00   CT HEAD WO CONTRAST ( )  Result Date: 05/21/2022 CLINICAL DATA:  Headache. EXAM: CT HEAD WITHOUT CONTRAST TECHNIQUE: Contiguous axial images were obtained from the base of the skull through the vertex without intravenous contrast. RADIATION DOSE REDUCTION: This exam was performed according to the departmental dose-optimization program which includes automated exposure control, adjustment of the mA and/or kV according to patient size and/or use of iterative reconstruction  technique. COMPARISON:  None Available. FINDINGS: Brain: There is periventricular white matter decreased attenuation consistent with small vessel ischemic changes. Ventricles, sulci and cisterns are prominent consistent with age related involutional changes. No acute intracranial hemorrhage, mass effect or shift. No hydrocephalus. Vascular: No hyperdense vessel or unexpected calcification. Skull: Normal. Negative for fracture or focal lesion. Sinuses/Orbits: No acute finding. IMPRESSION: Atrophy and chronic small vessel ischemic changes. No acute intracranial process identified. Electronically Signed   By: Layla Maw M.D.   On: 05/21/2022 22:08   DG Chest Port 1 View  Result Date: 05/21/2022 CLINICAL DATA:  Sepsis, hyperglycemia, febrile EXAM: PORTABLE CHEST 1 VIEW COMPARISON:  04/03/2022 FINDINGS: Single frontal view of the chest demonstrates an unremarkable cardiac silhouette. There is diffuse increased  interstitial prominence, with patchy bibasilar airspace disease. No effusion or pneumothorax. No acute bony abnormalities. IMPRESSION: 1. Diffuse interstitial prominence with patchy bibasilar airspace disease, which may reflect multifocal pneumonia or edema. Electronically Signed   By: Sharlet Salina M.D.   On: 05/21/2022 21:40    ECHO EF >55% 2018   LHC at [facility] 01/2017 FINDINGS   Coronary Angiography  Dominance: Right   Left Main:  The left main coronary artery (LMCA) is a large-caliber vessel  that originates from the left coronary sinus. It bifurcates into the left  anterior descending (LAD) and left circumflex (LCx) arteries. There is a  20% stenosis in the mid LMCA.   LAD:  The LAD is a large-caliber vessel that gives off 2 diagonal (D)  branches before it wraps around the apex. D1 is a moderate-to-large  caliber vessel without significant disease. D2 is a small-caliber vessel.  There is patent stent in the mid LAD with mild to moderate in-stent  restenosis most notably in the  proximal portion.  There is a 60% stenosis  distal to LAD stent   Left Circumflex:  The LCx is a large-caliber vessel that gives off 2  obtuse marginal (OM) branches and then continues as a small vessel in the  AV groove. OM1 is a small-caliber vessel with a 50-60% ostial stenosis.  OM2 is a large-caliber branching vessel with diffuse luminal  irregularities. There are diffuse luminal irregularities throughout the  LCx.   Right Coronary:  The right coronary artery (RCA) is a large-caliber vessel  originating from the right coronary sinus. It bifurcates distally into the  posterior descending artery (PDA) and 4 posterolateral (PL) branches  consistent with a right dominant system. The PDA is a moderate caliber  vesse. PL1 and PL4 are both moderate caliber vessells. PL2 and PL3 are  small vessels. There a long diffuse segment of disease in the proximal to  mid RCA with stenosis up to 60% with patient stent in the mid to distal  RCA. Mild in-stent restenosis is noted.   TELEMETRY reviewed by me (LT) 06/16/2022 : Sinus rhythm with PACs, rate 60s-70s with motion artifact  EKG reviewed by me: NSR rate 94 bpm, artifact  Data reviewed by me (LT) 06/16/2022: ED note, admission H&P, hospitalist progress note, last outpatient cardiology note last 24h vitals tele labs imaging I/O   Principal Problem:   Acute heart failure (HCC) Active Problems:   Coronary artery disease   Chronic anemia   Stage 3 chronic kidney disease (HCC)   Type 2 diabetes mellitus with peripheral neuropathy (HCC)    ASSESSMENT AND PLAN:  Gregory Tran is an 58yoM with a PMH of CAD s/p RCA and LAD stents (patent by [facility] 2018), DM2, HTN, chronic anemia, hx TIA with recent admission for sepsis secondary to PNA who presented to [facility] ED 06/12/2022 with shortness of breath while working with physical therapy.  There was initial concern for pneumonia based on CTA chest findings, but also had significant peripheral edema with  concern for heart failure contributing to his symptoms.  Cardiology is consulted for hospital day 4 for assistance with his CHF.  # Acute hypoxic respiratory failure # Suspected new CHF Presents with orthopnea/PND with a supplemental oxygen requirement with none at baseline.  Initial concern for recurrent pneumonia, but presentation was felt to be more consistent with a heart failure exacerbation as WBCs are within normal limits, and procalcitonin is negative, the patient is afebrile.  BNP was elevated to 747  on admission, and he is clinically somewhat hypervolemic on exam with crackles to auscultation, although peripheral edema appears to be improving.  He has diuresed a net negative of 5.4 L since admission. -Wean BiPAP as tolerated -Restart IV Lasix 60 mg twice daily -Historically, EF from 2018 was normal with LVEF >55%  -echo complete, performed this afternoon pending read.  Further recommendations based on its results  # AKI on CKD 3 Renal function improving with diuresis, continue to monitor daily -Monitor and replenish electrolytes for a goal K >4  # CAD s/p RCA and LAD stents  Continue aspirin 81 mg daily, and Crestor 5 mg every other day   This patient's plan of care was discussed and created with Dr. Darrold Junker and he is in agreement.  Signed: Rebeca Allegra , PA-C 06/16/2022, 11:13 AM [facility] Cardiology

## 2022-06-16 NOTE — Hospital Course (Addendum)
Gregory Tran is a 87 y.o. male with past medical history of CAD status post drug-eluting stent, hypertension, type 2 diabetes, chronic anemia, hyperlipidemia, TIA, BPH and recent admission for sepsis secondary to pneumonia presented hospital with shortness of breath while getting physical therapy with hypoxia.  He also had worsening lower extremity edema.  In the ED blood pressure was elevated.  BNP was elevated at 787 troponin at 61 hemoglobin 8.6 with a creatinine of 1.2. COVID-19 and influenza PCR negative. CTA was subsequently obtained that demonstrated no evidence of PE, however progressive multifocal bilateral airspace disease concerning for worsening pneumonia, progressive lymphadenopathy, and bilateral pleural effusions that are new since prior exam.  Patient was started on cefepime, vancomycin and IV fluids.  And was admitted to the hospital for further evaluation and treatment.  During hospitalization patient was diuresed but on 4/27 patient underwent 1 unit of PRBC transfusion and continued to have pulmonary edema.  Was started on Levaquin with additional diuretics and was put on BiPAP for hypercapnia with hypoxia.   Acute heart failure  Likely new diagnosis.  Presenting with shortness of breath respiratory distress lower extremity edema with elevated BNP and bilateral pleural effusion.  Received IV diuretics strict intake and output charting, daily weights.  Continue nebulizers and supportive care.  On fluid restriction.  Check 2D echocardiogram.  No previous 2D echocardiogram in the computer.  Seen by heart failure team during hospitalization.  Patient will need to follow-up with heart failure clinic on Jul 03, 2022 at 3:30 PM.   Coronary artery disease History of CAD with stents.  No active chest pain.  Continue aspirin and statins.     Stage 3a chronic kidney disease  Continue to monitor while on diuretics.  Creatinine of 1.3.  Improved from creatinine of 1.7 yesterday.   Type 2  diabetes mellitus with peripheral neuropathy Continue sliding scale insulin, hold oral hypoglycemics, continue Semglee 5 units at bedtime   Chronic anemia Hemoglobin of 8.5 today.  Continue to monitor.  1 unit of packed RBC during hospitalization with 60 mg of IV Lasix.  Continue with iron on discharge.  Weakness, deconditioning, debility.  Seen by physical therapy who recommended home PT on discharge.

## 2022-06-16 NOTE — Progress Notes (Signed)
*  PRELIMINARY RESULTS* Echocardiogram 2D Echocardiogram has been performed.  Carolyne Fiscal 06/16/2022, 1:18 PM

## 2022-06-16 NOTE — Progress Notes (Signed)
Occupational Therapy Treatment Patient Details Name: Gregory Tran MRN: [id number] DOB: 11-Dec-1935 Today's Date: 06/16/2022   History of present illness Pt is an 87 y.o. male presenting to hospital 06/12/22 with c/o SOB/respiratory distress when laying flat (working with physical therapy). Recent admission for PNA and sepsis; receiving HHPT  Pt admitted with acute heart failure.  PMH includes DM, htn, CAD s/p DES, chronic anemia, HLD, TIA, BPH, CKD, MI, neuropathy, RC tear, vertigo, R dupuytren contracture release, L4-5 kyphoplasty.   OT comments  Patient received in bed and agreeable to OT. Son present. Tx session targeted increasing activity tolerance for improved ADL completion. Pt engaged in bed level grooming tasks and BUE AROM exercises (see details below). Pt on 5L O2 via [place] t/o session. SpO2 maintained >90% with activity, however, endorsed SOB with minimal bed level activity. VC required for PLB and rest breaks provided. Pt left as received with all needs in reach. Pt is making progress toward goal completion. D/C recommendation remains appropriate. OT will continue to follow acutely.    Recommendations for follow up therapy are one component of a multi-disciplinary discharge planning process, led by the attending physician.  Recommendations may be updated based on patient status, additional functional criteria and insurance authorization.    Assistance Recommended at Discharge Frequent or constant Supervision/Assistance  Patient can return home with the following  A little help with walking and/or transfers;A little help with bathing/dressing/bathroom;Assistance with cooking/housework;Direct supervision/assist for medications management;Direct supervision/assist for financial management;Assist for transportation;Help with stairs or ramp for entrance   Equipment Recommendations  Other (comment) (drop arm commode chair)    Recommendations for Other Services      Precautions /  Restrictions Precautions Precautions: Fall Restrictions Weight Bearing Restrictions: No       Mobility Bed Mobility Overal bed mobility: Needs Assistance             General bed mobility comments: pt deferred due to fatigue    Transfers         Balance Overall balance assessment: History of Falls, Needs assistance     Sitting balance - Comments: pt deferred due to fatigue       ADL either performed or assessed with clinical judgement   ADL Overall ADL's : Needs assistance/impaired     Grooming: Set up;Bed level;Supervision/safety;Wash/dry face           Upper Body Dressing : Minimal assistance;Bed level                     General ADL Comments: Pt functionally limited by SOB, decreased endurance, and generalized weakness.    Extremity/Trunk Assessment Upper Extremity Assessment Upper Extremity Assessment: Generalized weakness   Lower Extremity Assessment Lower Extremity Assessment: Generalized weakness        Vision Patient Visual Report: No change from baseline     Perception     Praxis      Cognition Arousal/Alertness: Awake/alert Behavior During Therapy: WFL for tasks assessed/performed Overall Cognitive Status: Within Functional Limits for tasks assessed          Exercises General Exercises - Upper Extremity Shoulder Flexion: AROM, Strengthening, Both, 10 reps, Supine Shoulder Horizontal ABduction: AROM, Strengthening, Both, 10 reps, Supine Shoulder Horizontal ADduction: AROM, Strengthening, Both, 10 reps, Supine Elbow Flexion: AROM, Strengthening, Both, 10 reps, Supine Elbow Extension: AROM, Strengthening, Both, 10 reps, Supine General Exercises - Lower Extremity Ankle Circles/Pumps: AROM, Strengthening, Both, 10 reps, Supine Heel Slides: AROM, Strengthening, Both, 10 reps, Supine Other Exercises  Other Exercises: Education provided re: energy conservation techniques (rest breaks, PLB)    Shoulder Instructions        General Comments Pt on 5L O2 via [place] t/o, SpO2 maintained >90% with activity, however, endorsed SOB with minimal bed level activity. VC for PLB.    Pertinent Vitals/ Pain       Pain Assessment Pain Assessment: No/denies pain  Home Living          Prior Functioning/Environment              Frequency  Min 1X/week        Progress Toward Goals  OT Goals(current goals can now be found in the care plan section)  Progress towards OT goals: Progressing toward goals  Acute Rehab OT Goals Patient Stated Goal: to get stronger OT Goal Formulation: With family Time For Goal Achievement: 06/27/22 Potential to Achieve Goals: Fair  Plan Discharge plan remains appropriate;Frequency remains appropriate    Co-evaluation                 AM-PAC OT "6 Clicks" Daily Activity     Outcome Measure   Help from another person eating meals?: None Help from another person taking care of personal grooming?: A Little Help from another person toileting, which includes using toliet, bedpan, or urinal?: A Little Help from another person bathing (including washing, rinsing, drying)?: A Little Help from another person to put on and taking off regular upper body clothing?: None Help from another person to put on and taking off regular lower body clothing?: A Little 6 Click Score: 20    End of Session    OT Visit Diagnosis: History of falling (Z91.81);Repeated falls (R29.6);Muscle weakness (generalized) (M62.81);Unsteadiness on feet (R26.81)   Activity Tolerance Patient limited by fatigue;Other (comment) (SOB)   Patient Left in bed;with call bell/phone within reach;with bed alarm set;with family/visitor present   Nurse Communication Mobility status        Time: 5366-4403 OT Time Calculation (min): 19 min  Charges: OT General Charges $OT Visit: 1 Visit OT Treatments $Self Care/Home Management : 8-22 mins  [facility] MS, OTR/L ascom [phone]  06/16/22, 5:24 PM

## 2022-06-16 NOTE — TOC Progression Note (Signed)
Transition of Care [facility]) - Progression Note    Patient Details  Name: Gregory Tran MRN: [id number] Date of Birth: 1935/06/08  [facility] CM/SW Contact  Truddie Hidden, RN Phone Number: 06/16/2022, 3:50 PM  Clinical Narrative:    Case reviewed for DME needs and changes in discharge disposition.          Expected Discharge Plan and Services                                               Social Determinants of Health (SDOH) Interventions SDOH Screenings   Food Insecurity: No Food Insecurity (05/22/2022)  Housing: Medium Risk (05/22/2022)  Transportation Needs: No Transportation Needs (05/22/2022)  Utilities: Not At Risk (05/22/2022)  Tobacco Use: Medium Risk (06/13/2022)    Readmission Risk Interventions    05/23/2022    3:47 PM  Readmission Risk Prevention Plan  Transportation Screening Complete  HRI or Home Care Consult Complete  Palliative Care Screening Not Applicable  Medication Review (RN Care Manager) Complete

## 2022-06-16 NOTE — Progress Notes (Addendum)
PROGRESS NOTE    Gregory Tran  ZOX:[id number] DOB: 12/17/1935 DOA: 06/12/2022 PCP: Myrene Buddy, NP    Brief Narrative:  Gregory Tran is a 87 y.o. male with past medical history of CAD status post drug-eluting stent, hypertension, type 2 diabetes, chronic anemia, hyperlipidemia, TIA, BPH and recent admission for sepsis secondary to pneumonia presented hospital with shortness of breath while getting physical therapy with hypoxia.  He also had worsening lower extremity edema.  In the ED, blood pressure was elevated.  BNP was elevated at 787, troponin at 61 hemoglobin 8.6 with a creatinine of 1.2. COVID-19 and influenza PCR negative. CTA was subsequently obtained that demonstrated no evidence of PE, however progressive multifocal bilateral airspace disease concerning for worsening pneumonia, progressive lymphadenopathy, and bilateral pleural effusions that are new since prior exam.  Patient was started on cefepime, vancomycin and IV fluids.  Patient was then admitted to the hospital for further evaluation and treatment.  During hospitalization patient was diuresed but on 4/27 patient underwent 1 unit of PRBC transfusion and continued to have pulmonary edema.  Was started on Levaquin with additional diuretics and was put on BiPAP for hypercapnia with hypoxia.   Assessment and plan.  Acute heart failure  Likely new diagnosis.  Presenting with shortness of breath, respiratory distress lower extremity edema with elevated BNP and bilateral pleural effusion.  Received IV diuretics, continue strict intake and output charting, daily weights.  Continue nebulizers and supportive care.  Empirically on IV Levaquin.  Patient is afebrile.  No leukocytosis.  On fluid restriction.  Check 2D echocardiogram.  No previous 2D echocardiogram in the computer.  Seen by heart failure team during hospitalization.  Patient will need to follow-up with heart failure clinic on Jul 03, 2022 at 3:30 PM.  Since  patient has ongoing respiratory distress and new diagnosis of heart failure will get cardiology consultation.  Acute hypoxic and hypercarbic respiratory failure.  Patient with respiratory distress and abnormal ABG.  Currently on BiPAP.  Will continue with bronchodilators, BiPAP, IV diuretics.   Coronary artery disease History of CAD with stents.  No active chest pain.  Continue aspirin and statins.     Stage 3a chronic kidney disease  Continue to monitor while on diuretics.  Creatinine of 1.3.  Improved from creatinine of 1.7 yesterday.   Type 2 diabetes mellitus with peripheral neuropathy Continue sliding scale insulin, hold oral hypoglycemics, continue Semglee 5 units at bedtime   Chronic anemia Hemoglobin of 8.5 today.  Continue to monitor.  1 unit of packed RBC during hospitalization with 60 mg of IV Lasix.  Continue with iron on discharge.  Weakness, deconditioning, debility.  Seen by physical therapy who recommended home PT on discharge.     DVT prophylaxis: enoxaparin (LOVENOX) injection 40 mg Start: 06/12/22 2000   Code Status:     Code Status: Full Code  Disposition: Uncertain at this time likely home with home health when better in few days. Status is: Inpatient Remains inpatient appropriate because: Decompensated heart failure, on BiPAP,   Family Communication: Spoke with the patient's son at bedside.  Consultants:  Cardiology  Procedures:  BiPAP placement  Antimicrobials:  Levaquin  Anti-infectives (From admission, onward)    Start     Dose/Rate Route Frequency Ordered Stop   06/15/22 1500  levofloxacin (LEVAQUIN) IVPB 750 mg        750 mg 100 mL/hr over 90 Minutes Intravenous Every 48 hours 06/15/22 1400     06/12/22 1700  vancomycin (  VANCOREADY) IVPB 1750 mg/350 mL        1,750 mg 175 mL/hr over 120 Minutes Intravenous  Once 06/12/22 1625 06/12/22 1923   06/12/22 1630  vancomycin (VANCOCIN) IVPB 1000 mg/200 mL premix  Status:  Discontinued        1,000  mg 200 mL/hr over 60 Minutes Intravenous  Once 06/12/22 1621 06/12/22 1625   06/12/22 1630  ceFEPIme (MAXIPIME) 2 g in sodium chloride 0.9 % 100 mL IVPB        2 g 200 mL/hr over 30 Minutes Intravenous  Once 06/12/22 1621 06/12/22 1728      Subjective: Today, patient was seen and examined at bedside.  Patient states that he has difficulty breathing and feels suffocated.  Feels better on BiPAP.  Able to communicate..  Patient's son at bedside.  Denies any nausea vomiting pain.  Complains of generalized skin drying.  Objective: Vitals:   06/16/22 0400 06/16/22 0411 06/16/22 0833 06/16/22 0846  BP:  (!) 138/55 (!) 150/75   Pulse:  71 (!) 101 100  Resp: 10  20   Temp:  97.9 F (36.6 C) 98.2 F (36.8 C)   TempSrc:  Oral Oral   SpO2:  97% 94% 96%  Weight:  86.4 kg    Height:        Intake/Output Summary (Last 24 hours) at 06/16/2022 1140 Last data filed at 06/16/2022 0328 Gross per 24 hour  Intake 630 ml  Output 2300 ml  Net -1670 ml   Filed Weights   06/15/22 0500 06/15/22 0655 06/16/22 0411  Weight: 84.2 kg 86.2 kg 86.4 kg    Physical Examination: Body mass index is 28.96 kg/m.   General:  Average built, in mild distress, Communicative, currently on BiPAP, weak and deconditioned, HENT:   No scleral pallor or icterus noted. Oral mucosa is moist.  Chest:    Diminished breath sounds bilaterally.  Coarse breath sounds noted.  Mild expiratory wheezes. CVS: S1 &S2 heard. No murmur.  Regular rate and rhythm. Abdomen: Soft, nontender, nondistended.  Bowel sounds are heard.   Extremities: No cyanosis, clubbing with peripheral edema 2+,  peripheral pulses are palpable. Psych: Alert, awake and oriented, normal mood CNS:  No cranial nerve deficits.  Power equal in all extremities.   Skin: Warm and dry.  No rashes noted.  Data Reviewed:   CBC: Recent Labs  Lab 06/12/22 1501 06/13/22 0405 06/14/22 0311 06/15/22 0307 06/16/22 0511  WBC 7.4 8.7 6.2 5.7 6.8  NEUTROABS  --  7.2   --   --   --   HGB 8.6* 8.8* 7.8* 7.5* 8.5*  HCT 28.9* 28.6* 25.6* 25.2* 27.7*  MCV 96.3 94.1 93.8 93.3 92.6  PLT 362 393 287 274 262    Basic Metabolic Panel: Recent Labs  Lab 06/12/22 1501 06/13/22 0405 06/14/22 0311 06/15/22 0307 06/16/22 0511  NA 139 141 142 137 140  K 4.4 4.6 4.0 3.9 3.6  CL 108 109 110 106 103  CO2 23 21* 25 26 27   GLUCOSE 116* 117* 155* 366* 222*  BUN 18 19 21  24* 20  CREATININE 1.23 1.35* 1.59* 1.76* 1.31*  CALCIUM 8.4* 8.4* 8.3* 8.1* 8.2*  MG  --  1.9 2.1 2.0 1.9    Liver Function Tests: Recent Labs  Lab 06/15/22 0307 06/16/22 0511  AST 20 27  ALT 20 19  ALKPHOS 54 54  BILITOT 0.6 0.8  PROT 6.2* 6.1*  ALBUMIN 2.8* 2.7*     Radiology Studies: DG  Chest Port 1 View  Result Date: 06/15/2022 CLINICAL DATA:  Increased shortness of breath. EXAM: PORTABLE CHEST 1 VIEW COMPARISON:  Radiograph 05/21/2022. Chest CT 06/12/2022 FINDINGS: The heart is enlarged. Bilateral pleural effusions, right greater than left. Developing right left perihilar opacity. Worsening pulmonary edema with increasing septal thickening and diffuse pulmonary opacity. No pneumothorax. IMPRESSION: 1. CHF with cardiomegaly, increasing pulmonary edema, and bilateral pleural effusions. 2. Developing right perihilar opacity, may be asymmetric pulmonary edema or pneumonia. Electronically Signed   By: Narda Rutherford M.D.   On: 06/15/2022 13:46      LOS: 4 days    Joycelyn Das, MD [facility] Available via Epic secure chat 7am-7pm After these hours, please refer to coverage provider listed on [email or web address] 06/16/2022, 11:40 AM

## 2022-06-17 ENCOUNTER — Inpatient Hospital Stay: Payer: Medicare Other

## 2022-06-17 DIAGNOSIS — I5021 Acute systolic (congestive) heart failure: Secondary | ICD-10-CM | POA: Diagnosis not present

## 2022-06-17 LAB — CBC
HCT: 33.9 % — ABNORMAL LOW (ref 39.0–52.0)
Hemoglobin: 10.4 g/dL — ABNORMAL LOW (ref 13.0–17.0)
MCH: 28.5 pg (ref 26.0–34.0)
MCHC: 30.7 g/dL (ref 30.0–36.0)
MCV: 92.9 fL (ref 80.0–100.0)
Platelets: 337 10*3/uL (ref 150–400)
RBC: 3.65 MIL/uL — ABNORMAL LOW (ref 4.22–5.81)
RDW: 15.5 % (ref 11.5–15.5)
WBC: 8 10*3/uL (ref 4.0–10.5)
nRBC: 0 % (ref 0.0–0.2)

## 2022-06-17 LAB — BASIC METABOLIC PANEL
Anion gap: 10 (ref 5–15)
BUN: 21 mg/dL (ref 8–23)
CO2: 33 mmol/L — ABNORMAL HIGH (ref 22–32)
Calcium: 8.7 mg/dL — ABNORMAL LOW (ref 8.9–10.3)
Chloride: 98 mmol/L (ref 98–111)
Creatinine, Ser: 1.17 mg/dL (ref 0.61–1.24)
GFR, Estimated: >60 mL/min (ref >60)
Glucose, Bld: 186 mg/dL — ABNORMAL HIGH (ref 70–99)
Potassium: 3.8 mmol/L (ref 3.5–5.1)
Sodium: 141 mmol/L (ref 135–145)

## 2022-06-17 LAB — BRAIN NATRIURETIC PEPTIDE: B Natriuretic Peptide: 1369.4 pg/mL — ABNORMAL HIGH (ref 0.0–100.0)

## 2022-06-17 LAB — MAGNESIUM: Magnesium: 2 mg/dL (ref 1.7–2.4)

## 2022-06-17 LAB — GLUCOSE, CAPILLARY
Glucose-Capillary: 201 mg/dL — ABNORMAL HIGH (ref 70–99)
Glucose-Capillary: 269 mg/dL — ABNORMAL HIGH (ref 70–99)
Glucose-Capillary: 354 mg/dL — ABNORMAL HIGH (ref 70–99)
Glucose-Capillary: 265 mg/dL — ABNORMAL HIGH (ref 70–99)

## 2022-06-17 LAB — CULTURE, BLOOD (SINGLE): Culture: NO GROWTH

## 2022-06-17 MED ORDER — TRAZODONE HCL 50 MG PO TABS: 50.0000 mg | ORAL_TABLET | Freq: Every evening | ORAL | Status: DC | PRN

## 2022-06-17 MED ORDER — INSULIN ASPART 100 UNIT/ML IJ SOLN
3.0000 [IU] | Freq: Three times a day (TID) | INTRAMUSCULAR | Status: DC
  Administered 2022-06-17 – 2022-06-18 (×5): 3 [IU] via SUBCUTANEOUS
  Filled 2022-06-17 (×5): qty 1

## 2022-06-17 MED ORDER — METOPROLOL SUCCINATE ER 25 MG PO TB24
12.5000 mg | ORAL_TABLET | Freq: Every day | ORAL | Status: DC
  Administered 2022-06-17 – 2022-06-19 (×3): 12.5 mg via ORAL
  Filled 2022-06-17 (×3): qty 1

## 2022-06-17 MED ORDER — POTASSIUM CHLORIDE 20 MEQ PO PACK
40.0000 meq | PACK | Freq: Two times a day (BID) | ORAL | Status: AC
  Administered 2022-06-17 (×2): 40 meq via ORAL
  Filled 2022-06-17 (×2): qty 2

## 2022-06-17 MED ORDER — SENNOSIDES-DOCUSATE SODIUM 8.6-50 MG PO TABS: 1.0000 | ORAL_TABLET | Freq: Every evening | ORAL | Status: DC | PRN

## 2022-06-17 MED ORDER — HYDRALAZINE HCL 20 MG/ML IJ SOLN: 10.0000 mg | INTRAMUSCULAR | Status: DC | PRN

## 2022-06-17 MED ORDER — LOSARTAN POTASSIUM 25 MG PO TABS
25.0000 mg | ORAL_TABLET | Freq: Every day | ORAL | Status: DC
  Administered 2022-06-17 – 2022-06-19 (×3): 25 mg via ORAL
  Filled 2022-06-17 (×3): qty 1

## 2022-06-17 MED ORDER — METOPROLOL TARTRATE 5 MG/5ML IV SOLN: 5.0000 mg | INTRAVENOUS | Status: DC | PRN

## 2022-06-17 MED ORDER — IPRATROPIUM-ALBUTEROL 0.5-2.5 (3) MG/3ML IN SOLN
3.0000 mL | Freq: Three times a day (TID) | RESPIRATORY_TRACT | Status: DC
  Administered 2022-06-17 – 2022-06-18 (×4): 3 mL via RESPIRATORY_TRACT
  Filled 2022-06-17 (×3): qty 3

## 2022-06-17 NOTE — Progress Notes (Addendum)
[facility] CARDIOLOGY CONSULT NOTE       Patient ID: Gregory Tran MRN: [id number] DOB/AGE: 03/08/1935 87 y.o.  Admit date: 06/12/2022 Referring Physician Dr. Joycelyn Das  Primary Physician Lenon Oms, NP  Primary Cardiologist previous Dr. Lady Gary  Reason for Consultation new HF  HPI: Gregory Tran. Crass is an 87yoM with a PMH of CAD s/p RCA and LAD stents (patent by [facility] 2018), DM2, HTN, chronic anemia, hx TIA with recent admission for sepsis secondary to PNA who presented to [facility] ED 06/12/2022 with shortness of breath while working with physical therapy.  There was initial concern for pneumonia based on CTA chest findings, but also had significant peripheral edema with concern for heart failure contributing to his symptoms.  Cardiology is consulted for hospital day 4 for assistance with his CHF.  Interval History: - complaints of a wet cough this AM, felt better after clearing some phlegm  - no chest pain, shortness of breath, peripheral edema much improved. - renal function normalized - brisk diuresis yesterday with net neg 4.4L  - echo resulted with reduced EF 40-45%, severe HK of LV mid apical anterior wall segment and apical segment   Review of systems complete and found to be negative unless listed above     Past Medical History:  Diagnosis Date   Arthritis    BPH (benign prostatic hyperplasia)    Cancer (HCC)    Basal Cell   Chronic kidney disease    Coronary artery disease    Diabetes mellitus (HCC)    Ejaculatory disorder    Erectile dysfunction    Frequency    GERD (gastroesophageal reflux disease)    Gross hematuria    Heart disease    Hematuria    HTN (hypertension)    Hyperlipidemia    Incomplete bladder emptying    Microscopic hematuria    Myocardial infarction (HCC)    Neuropathy    Nocturia    Rotator cuff tear left   TIA (transient ischemic attack)    Tick bite of multiple sites 5 days ago   chest, and groin area   Vertigo    1-2x/yr     Past Surgical History:  Procedure Laterality Date   cardiac stents     CATARACT EXTRACTION W/PHACO Left 08/02/2018   Procedure: CATARACT EXTRACTION PHACO AND INTRAOCULAR LENS PLACEMENT (IOC)  LEFT DIABETIC;  Surgeon: Nevada Crane, MD;  Location: [facility];  Service: Ophthalmology;  Laterality: Left;  diabetes - insulin and oral meds   CATARACT EXTRACTION W/PHACO Right 10/11/2018   Procedure: CATARACT EXTRACTION PHACO AND INTRAOCULAR LENS PLACEMENT (IOC) RIGHT DIABETES;  Surgeon: Nevada Crane, MD;  Location: [facility];  Service: Ophthalmology;  Laterality: Right;  Diabetic - insulin and oral meds   CORONARY ANGIOPLASTY  2001, 2008   [facility]   DUPUYTREN CONTRACTURE RELEASE Right 04/30/2016   Procedure: DUPUYTREN CONTRACTURE RELEASE;  Surgeon: Erin Sons, MD;  Location: [facility];  Service: Orthopedics;  Laterality: Right;   KYPHOPLASTY N/A 01/26/2018   Procedure: Pearletha Furl;  Surgeon: Kennedy Bucker, MD;  Location: [facility];  Service: Orthopedics;  Laterality: N/A;   ROTATOR CUFF REPAIR Bilateral    TRANSURETHRAL RESECTION OF PROSTATE N/A 07/09/2015   Procedure: TRANSURETHRAL RESECTION OF THE PROSTATE (TURP);  Surgeon: Vanna Scotland, MD;  Location: [facility];  Service: Urology;  Laterality: N/A;    Medications Prior to Admission  Medication Sig Dispense Refill Last Dose   amLODipine (NORVASC) 10 MG tablet Take 5 mg by  mouth daily.   06/12/2022   ammonium lactate (AMLACTIN) 12 % cream Apply 1 Application topically 2 (two) times daily. (Apply to back)   Past Week   aspirin EC 81 MG tablet Take 81 mg by mouth daily.    06/12/2022   diphenhydrAMINE (BENADRYL) 25 mg capsule Take 1 capsule (25 mg total) by mouth every 6 (six) hours as needed for itching, sleep or allergies. 30 capsule 0 unknown   gabapentin (NEURONTIN) 100 MG capsule Take 100 mg by mouth daily.   06/12/2022   insulin aspart (NOVOLOG) 100 UNIT/ML FlexPen Inject 3 Units into the skin 3 (three) times  daily with meals. 15 mL 3 06/12/2022   insulin degludec (TRESIBA) 100 UNIT/ML FlexTouch Pen Inject 10 Units into the skin daily.   06/12/2022   metFORMIN (GLUCOPHAGE) 1000 MG tablet Take 1,000 mg by mouth daily.   06/12/2022   metoprolol succinate (TOPROL-XL) 25 MG 24 hr tablet Take 12.5 mg by mouth daily.   06/12/2022   pantoprazole (PROTONIX) 40 MG tablet Take 40 mg by mouth daily.    06/12/2022   rosuvastatin (CRESTOR) 5 MG tablet Take 5 mg by mouth every other day.   1 Past Week   tamsulosin (FLOMAX) 0.4 MG CAPS capsule TAKE 1 CAPSULE(0.4 MG) BY MOUTH DAILY (Patient taking differently: Take 0.4 mg by mouth daily.) 30 capsule 3    Social History   Socioeconomic History   Marital status: Single    Spouse name: Not on file   Number of children: Not on file   Years of education: Not on file   Highest education level: Not on file  Occupational History   Not on file  Tobacco Use   Smoking status: Former    Packs/day: 1.00    Years: 50.00    Additional pack years: 0.00    Total pack years: 50.00    Types: Cigarettes    Quit date: 06/26/2017    Years since quitting: 4.9   Smokeless tobacco: Never  Vaping Use   Vaping Use: Never used  Substance and Sexual Activity   Alcohol use: No    Alcohol/week: 0.0 standard drinks of alcohol   Drug use: No   Sexual activity: Not on file  Other Topics Concern   Not on file  Social History Narrative   Not on file   Social Determinants of Health   Financial Resource Strain: Not on file  Food Insecurity: No Food Insecurity (05/22/2022)   Hunger Vital Sign    Worried About Running Out of Food in the Last Year: Never true    Ran Out of Food in the Last Year: Never true  Transportation Needs: No Transportation Needs (05/22/2022)   PRAPARE - Administrator, Civil Service (Medical): No    Lack of Transportation (Non-Medical): No  Physical Activity: Not on file  Stress: Not on file  Social Connections: Not on file  Intimate Partner  Violence: Not At Risk (05/22/2022)   Humiliation, Afraid, Rape, and Kick questionnaire    Fear of Current or Ex-Partner: No    Emotionally Abused: No    Physically Abused: No    Sexually Abused: No    Family History  Problem Relation Age of Onset   Ovarian cancer Sister    Kidney disease Brother        born one kidney   Bladder Cancer Neg Hx    Prostate cancer Neg Hx       Intake/Output Summary (Last 24 hours)  at 06/17/2022 0837 Last data filed at 06/17/2022 0421 Gross per 24 hour  Intake 820 ml  Output 5300 ml  Net -4480 ml     Vitals:   06/16/22 2309 06/17/22 0305 06/17/22 0737 06/17/22 0819  BP: 131/62 (!) 151/66  (!) 148/62  Pulse: 66 69 71 69  Resp: 18 18 18 18   Temp: 99 F (37.2 C) 98.5 F (36.9 C)  97.8 F (36.6 C)  TempSrc:      SpO2: 100% 99% 98% 100%  Weight:  75.6 kg    Height:        PHYSICAL EXAM General: Elderly and frail-appearing Caucasian male, in no acute distress.  Sitting upright in bed without family present. Generally dry, flaky skin  HEENT:  Normocephalic and atraumatic. Neck:  No JVD.  Lungs: Normal respiratory effort on 5L [place]. Decreased breath sounds, trace crackles in bases Heart: HRRR . Normal S1 and S2 without gallops or murmurs.  Abdomen: Non-distended appearing.  Msk: Normal strength and tone for age. Extremities: Warm and well perfused. No clubbing, cyanosis.  Much improved bilateral lower extremity edema with skin dimpling and wrinkling.  Neuro: Awake and alert,  Psych:  Answers questions appropriately, but is a limited historian   Labs: Basic Metabolic Panel: Recent Labs    06/16/22 0511 06/17/22 0554  NA 140 141  K 3.6 3.8  CL 103 98  CO2 27 33*  GLUCOSE 222* 186*  BUN 20 21  CREATININE 1.31* 1.17  CALCIUM 8.2* 8.7*  MG 1.9 2.0    Liver Function Tests: Recent Labs    06/15/22 0307 06/16/22 0511  AST 20 27  ALT 20 19  ALKPHOS 54 54  BILITOT 0.6 0.8  PROT 6.2* 6.1*  ALBUMIN 2.8* 2.7*    No results for  input(s): "LIPASE", "AMYLASE" in the last 72 hours. CBC: Recent Labs    06/16/22 0511 06/17/22 0554  WBC 6.8 8.0  HGB 8.5* 10.4*  HCT 27.7* 33.9*  MCV 92.6 92.9  PLT 262 337    Cardiac Enzymes: No results for input(s): "CKTOTAL", "CKMB", "CKMBINDEX", "TROPONINIHS" in the last 72 hours. BNP: No results for input(s): "BNP" in the last 72 hours. D-Dimer: No results for input(s): "DDIMER" in the last 72 hours. Hemoglobin A1C: No results for input(s): "HGBA1C" in the last 72 hours. Fasting Lipid Panel: No results for input(s): "CHOL", "HDL", "LDLCALC", "TRIG", "CHOLHDL", "LDLDIRECT" in the last 72 hours. Thyroid Function Tests: No results for input(s): "TSH", "T4TOTAL", "T3FREE", "THYROIDAB" in the last 72 hours.  Invalid input(s): "FREET3" Anemia Panel: No results for input(s): "VITAMINB12", "FOLATE", "FERRITIN", "TIBC", "IRON", "RETICCTPCT" in the last 72 hours.   Radiology: ECHOCARDIOGRAM COMPLETE  Result Date: 06/16/2022    ECHOCARDIOGRAM REPORT   Patient Name:   KAMIL MCHAFFIE [facility] Date of Exam: 06/16/2022 Medical Rec #:  [id number]          Height:       68.0 in Accession #:    [id number]         Weight:       190.5 lb Date of Birth:  [birth year]          BSA:          2.002 m Patient Age:    86 years           BP:           150/75 mmHg Patient Gender: M  HR:           69 bpm. Exam Location:  [facility] Procedure: 2D Echo, Cardiac Doppler, Color Doppler and Intracardiac            Opacification Agent Indications:     CHF  History:         Patient has no prior history of Echocardiogram examinations.                  CHF, CAD; Risk Factors:Hypertension, Diabetes and Dyslipidemia.  Sonographer:     Mikki Harbor Referring Phys:  [id number] Verdene Lennert Diagnosing Phys: Lorine Bears MD  Sonographer Comments: Suboptimal parasternal window and Technically difficult study due to poor echo windows. Image acquisition challenging due to respiratory motion. IMPRESSIONS  1. Left  ventricular ejection fraction, by estimation, is 40 to 45%. The left ventricle has mildly decreased function. The left ventricle demonstrates regional wall motion abnormalities (see scoring diagram/findings for description). Left ventricular diastolic parameters are indeterminate. There is severe hypokinesis of the left ventricular, mid-apical anterior wall and apical segment.  2. Right ventricular systolic function is normal. The right ventricular size is normal.  3. Left atrial size was mildly dilated.  4. The mitral valve is normal in structure. No evidence of mitral valve regurgitation. No evidence of mitral stenosis.  5. The aortic valve is normal in structure. Aortic valve regurgitation is not visualized. No aortic stenosis is present.  6. The inferior vena cava is normal in size with <50% respiratory variability, suggesting right atrial pressure of 8 mmHg. FINDINGS  Left Ventricle: Left ventricular ejection fraction, by estimation, is 40 to 45%. The left ventricle has mildly decreased function. The left ventricle demonstrates regional wall motion abnormalities. Severe hypokinesis of the left ventricular, mid-apical  anterior wall and apical segment. Definity contrast agent was given IV to delineate the left ventricular endocardial borders. The left ventricular internal cavity size was normal in size. There is no left ventricular hypertrophy. Left ventricular diastolic parameters are indeterminate. Right Ventricle: The right ventricular size is normal. No increase in right ventricular wall thickness. Right ventricular systolic function is normal. Left Atrium: Left atrial size was mildly dilated. Right Atrium: Right atrial size was normal in size. Pericardium: There is no evidence of pericardial effusion. Mitral Valve: The mitral valve is normal in structure. No evidence of mitral valve regurgitation. No evidence of mitral valve stenosis. MV peak gradient, 5.6 mmHg. The mean mitral valve gradient is 2.0 mmHg.  Tricuspid Valve: The tricuspid valve is normal in structure. Tricuspid valve regurgitation is not demonstrated. No evidence of tricuspid stenosis. Aortic Valve: The aortic valve is normal in structure. Aortic valve regurgitation is not visualized. No aortic stenosis is present. Aortic valve mean gradient measures 4.0 mmHg. Aortic valve peak gradient measures 7.0 mmHg. Aortic valve area, by VTI measures 2.50 cm. Pulmonic Valve: The pulmonic valve was normal in structure. Pulmonic valve regurgitation is not visualized. No evidence of pulmonic stenosis. Aorta: The aortic root is normal in size and structure. Venous: The inferior vena cava is normal in size with less than 50% respiratory variability, suggesting right atrial pressure of 8 mmHg. IAS/Shunts: No atrial level shunt detected by color flow Doppler.  LEFT VENTRICLE PLAX 2D LVIDd:         5.60 cm   Diastology LVIDs:         3.70 cm   LV e' medial:    6.42 cm/s LV PW:         1.20 cm  LV E/e' medial:  16.2 LV IVS:        1.10 cm   LV e' lateral:   9.68 cm/s LVOT diam:     2.00 cm   LV E/e' lateral: 10.7 LV SV:         85 LV SV Index:   42 LVOT Area:     3.14 cm  RIGHT VENTRICLE RV Basal diam:  3.90 cm RV Mid diam:    3.40 cm LEFT ATRIUM             Index        RIGHT ATRIUM           Index LA diam:        4.10 cm 2.05 cm/m   RA Area:     19.40 cm LA Vol (A2C):   73.6 ml 36.77 ml/m  RA Volume:   50.00 ml  24.98 ml/m LA Vol (A4C):   71.7 ml 35.82 ml/m LA Biplane Vol: 77.8 ml 38.86 ml/m  AORTIC VALVE AV Area (Vmax):    2.57 cm AV Area (Vmean):   2.32 cm AV Area (VTI):     2.50 cm AV Vmax:           132.00 cm/s AV Vmean:          95.300 cm/s AV VTI:            0.338 m AV Peak Grad:      7.0 mmHg AV Mean Grad:      4.0 mmHg LVOT Vmax:         108.00 cm/s LVOT Vmean:        70.300 cm/s LVOT VTI:          0.269 m LVOT/AV VTI ratio: 0.80  AORTA Ao Root diam: 3.70 cm MITRAL VALVE MV Area (PHT): 4.74 cm     SHUNTS MV Area VTI:   2.39 cm     Systemic VTI:   0.27 m MV Peak grad:  5.6 mmHg     Systemic Diam: 2.00 cm MV Mean grad:  2.0 mmHg MV Vmax:       1.18 m/s MV Vmean:      61.4 cm/s MV Decel Time: 160 msec MV E velocity: 104.00 cm/s MV A velocity: 100.00 cm/s MV E/A ratio:  1.04 Lorine Bears MD Electronically signed by Lorine Bears MD Signature Date/Time: 06/16/2022/5:43:40 PM    Final    DG Chest Port 1 View  Result Date: 06/15/2022 CLINICAL DATA:  Increased shortness of breath. EXAM: PORTABLE CHEST 1 VIEW COMPARISON:  Radiograph 05/21/2022. Chest CT 06/12/2022 FINDINGS: The heart is enlarged. Bilateral pleural effusions, right greater than left. Developing right left perihilar opacity. Worsening pulmonary edema with increasing septal thickening and diffuse pulmonary opacity. No pneumothorax. IMPRESSION: 1. CHF with cardiomegaly, increasing pulmonary edema, and bilateral pleural effusions. 2. Developing right perihilar opacity, may be asymmetric pulmonary edema or pneumonia. Electronically Signed   By: Narda Rutherford M.D.   On: 06/15/2022 13:46   CT Angio Chest PE W and/or Wo Contrast  Result Date: 06/12/2022 CLINICAL DATA:  Short of breath, productive cough EXAM: CT ANGIOGRAPHY CHEST WITH CONTRAST TECHNIQUE: Multidetector CT imaging of the chest was performed using the standard protocol during bolus administration of intravenous contrast. Multiplanar CT image reconstructions and MIPs were obtained to evaluate the vascular anatomy. RADIATION DOSE REDUCTION: This exam was performed according to the departmental dose-optimization program which includes automated exposure control, adjustment of the mA and/or kV according to  patient size and/or use of iterative reconstruction technique. CONTRAST:  75mL OMNIPAQUE IOHEXOL 350 MG/ML SOLN COMPARISON:  05/21/2022 FINDINGS: Cardiovascular: This is a technically adequate evaluation of the pulmonary vasculature. No filling defects or pulmonary emboli. The heart is unremarkable without pericardial effusion. There  is dense atherosclerosis of the coronary vasculature greatest in the LAD and right coronary distribution. No evidence of thoracic aortic aneurysm or dissection. Stable atherosclerosis of the aorta. Mediastinum/Nodes: Stable appearance of the thyroid. Mucoid material layers dependently within the bilateral mainstem bronchi. The esophagus is unremarkable. Mild bilateral axillary lymphadenopathy has progressed since prior study. Index lymph node adjacent to the right subclavian vein reference image 39/4 measures 11 mm, previously measuring 8 mm. Multiple subcentimeter mediastinal and hilar lymph nodes are again noted. Lungs/Pleura: Interval development of bilateral pleural effusions, volume estimated less than 1 L each. Multifocal bilateral ground-glass airspace disease has progressed, right greater than left. There is bilateral bronchial wall thickening. No pneumothorax. Upper Abdomen: No acute abnormality. Musculoskeletal: No acute or destructive bony lesions. Reconstructed images demonstrate no additional findings. Review of the MIP images confirms the above findings. IMPRESSION: 1. No evidence of pulmonary embolus. 2. Progressive multifocal bilateral airspace disease and bronchial wall thickening, consistent with worsening pneumonia. 3. Progressive mediastinal, hilar, and axillary lymphadenopathy, likely reactive. 4. Bilateral pleural effusions, new since prior exam, volume estimated less than 1 L each. 5. Aortic Atherosclerosis (ICD10-I70.0). Coronary artery atherosclerosis. Electronically Signed   By: Sharlet Salina M.D.   On: 06/12/2022 16:09   CT CHEST ABDOMEN PELVIS WO CONTRAST  Result Date: 05/21/2022 CLINICAL DATA:  Sepsis, fever, hyperglycemia EXAM: CT CHEST, ABDOMEN AND PELVIS WITHOUT CONTRAST TECHNIQUE: Multidetector CT imaging of the chest, abdomen and pelvis was performed following the standard protocol without IV contrast. RADIATION DOSE REDUCTION: This exam was performed according to the  departmental dose-optimization program which includes automated exposure control, adjustment of the mA and/or kV according to patient size and/or use of iterative reconstruction technique. COMPARISON:  None Available. FINDINGS: CT CHEST FINDINGS Cardiovascular: Extensive multi-vessel coronary artery calcification. Cardiac size within normal limits. No pericardial effusion. Central pulmonary arteries are of normal caliber. Extensive atherosclerotic calcification within the thoracic aorta. Mediastinum/Nodes: The visualized thyroid is unremarkable. No pathologic thoracic adenopathy. The distal esophagus appears mildly circumferentially thick walled small amount of fluid is seen layering within the mid esophagus which may reflect changes of esophagitis in the setting of gastroesophageal reflux or esophageal dysmotility. This is not well assessed on this examination. Lungs/Pleura: Reticulonodular infiltrates have progressed within the lung bases bilaterally in keeping with atypical infection or aspiration. Trace superimposed interstitial pulmonary edema and bilateral pleural effusions are present. No pneumothorax. No central obstructing lesion. Bronchial wall thickening in keeping with airway inflammation noted. Musculoskeletal: No acute bone abnormality. No lytic or blastic bone lesion. CT ABDOMEN PELVIS FINDINGS Hepatobiliary: No focal liver abnormality is seen. No gallstones, gallbladder wall thickening, or biliary dilatation. Pancreas: Unremarkable Spleen: Unremarkable Adrenals/Urinary Tract: The adrenal glands are unremarkable. The kidneys are normal in size and position. Vascular calcifications noted within the renal hila bilaterally. No definite urinary renal or ureteral calculi. The bladder is distended but is otherwise unremarkable. Stomach/Bowel: Moderate colonic stool burden without evidence of obstruction. Stomach, small bowel, and large bowel are otherwise unremarkable. Appendix normal. No free  intraperitoneal gas or fluid. Vascular/Lymphatic: Extensive aortoiliac atherosclerotic calcification. Particularly prominent atherosclerotic calcification noted at the origin of the superior mesenteric artery, however, the degree of stenosis is not well assessed on this noncontrast examination. Extensive calcification  also noted within the lower extremity arterial inflow bilaterally. No aortic aneurysm. No pathologic adenopathy within the abdomen and pelvis. Reproductive: Moderate prostatic hypertrophy. Other: No abdominal wall hernia. Musculoskeletal: L4 and L5 vertebral augmentation has been performed. Osseous structures are otherwise age-appropriate. No acute bone abnormality. No lytic or blastic bone lesion. IMPRESSION: 1. Progressive reticulonodular infiltrates within the lung bases bilaterally in keeping with atypical infection or aspiration. 2. Trace superimposed interstitial pulmonary edema and bilateral pleural effusions. 3. Extensive multi-vessel coronary artery calcification. 4. Mild circumferentially thick walled distal esophagus which may reflect changes of esophagitis in the setting of gastroesophageal reflux or esophageal dysmotility. Correlation with endoscopy may be helpful for further evaluation. 5. Moderate colonic stool burden without evidence of obstruction. 6. Extensive aortoiliac atherosclerotic calcification. Particularly prominent atherosclerotic calcification at the origin of the superior mesenteric artery, however, the degree of stenosis is not well assessed on this noncontrast examination. If there is clinical evidence of chronic mesenteric ischemia, CT arteriography may be helpful for further evaluation. 7. Moderate prostatic hypertrophy. Aortic Atherosclerosis (ICD10-I70.0). Electronically Signed   By: Helyn Numbers M.D.   On: 05/21/2022 23:00   CT HEAD WO CONTRAST ( )  Result Date: 05/21/2022 CLINICAL DATA:  Headache. EXAM: CT HEAD WITHOUT CONTRAST TECHNIQUE: Contiguous axial  images were obtained from the base of the skull through the vertex without intravenous contrast. RADIATION DOSE REDUCTION: This exam was performed according to the departmental dose-optimization program which includes automated exposure control, adjustment of the mA and/or kV according to patient size and/or use of iterative reconstruction technique. COMPARISON:  None Available. FINDINGS: Brain: There is periventricular white matter decreased attenuation consistent with small vessel ischemic changes. Ventricles, sulci and cisterns are prominent consistent with age related involutional changes. No acute intracranial hemorrhage, mass effect or shift. No hydrocephalus. Vascular: No hyperdense vessel or unexpected calcification. Skull: Normal. Negative for fracture or focal lesion. Sinuses/Orbits: No acute finding. IMPRESSION: Atrophy and chronic small vessel ischemic changes. No acute intracranial process identified. Electronically Signed   By: Layla Maw M.D.   On: 05/21/2022 22:08   DG Chest Port 1 View  Result Date: 05/21/2022 CLINICAL DATA:  Sepsis, hyperglycemia, febrile EXAM: PORTABLE CHEST 1 VIEW COMPARISON:  04/03/2022 FINDINGS: Single frontal view of the chest demonstrates an unremarkable cardiac silhouette. There is diffuse increased interstitial prominence, with patchy bibasilar airspace disease. No effusion or pneumothorax. No acute bony abnormalities. IMPRESSION: 1. Diffuse interstitial prominence with patchy bibasilar airspace disease, which may reflect multifocal pneumonia or edema. Electronically Signed   By: Sharlet Salina M.D.   On: 05/21/2022 21:40    ECHO EF >55% 2018   [facility] 01/2017 FINDINGS   Coronary Angiography  Dominance: Right   Left Main:  The left main coronary artery (LMCA) is a large-caliber vessel  that originates from the left coronary sinus. It bifurcates into the left  anterior descending (LAD) and left circumflex (LCx) arteries. There is a  20% stenosis in  the mid LMCA.   LAD:  The LAD is a large-caliber vessel that gives off 2 diagonal (D)  branches before it wraps around the apex. D1 is a moderate-to-large  caliber vessel without significant disease. D2 is a small-caliber vessel.  There is patent stent in the mid LAD with mild to moderate in-stent  restenosis most notably in the proximal portion.  There is a 60% stenosis  distal to LAD stent   Left Circumflex:  The LCx is a large-caliber vessel that gives off 2  obtuse marginal (  OM) branches and then continues as a small vessel in the  AV groove. OM1 is a small-caliber vessel with a 50-60% ostial stenosis.  OM2 is a large-caliber branching vessel with diffuse luminal  irregularities. There are diffuse luminal irregularities throughout the  LCx.   Right Coronary:  The right coronary artery (RCA) is a large-caliber vessel  originating from the right coronary sinus. It bifurcates distally into the  posterior descending artery (PDA) and 4 posterolateral (PL) branches  consistent with a right dominant system. The PDA is a moderate caliber  vesse. PL1 and PL4 are both moderate caliber vessells. PL2 and PL3 are  small vessels. There a long diffuse segment of disease in the proximal to  mid RCA with stenosis up to 60% with patient stent in the mid to distal  RCA. Mild in-stent restenosis is noted.   TELEMETRY reviewed by me (LT) 06/17/2022 : Sinus rhythm with PACs, rate 60s-70s with motion artifact  EKG reviewed by me: NSR rate 94 bpm, artifact  Data reviewed by me (LT) 06/17/2022: hospitalist progress note, last 24h vitals tele labs imaging I/O   Principal Problem:   Acute heart failure (HCC) Active Problems:   Coronary artery disease   Chronic anemia   Stage 3 chronic kidney disease (HCC)   Type 2 diabetes mellitus with peripheral neuropathy (HCC)    ASSESSMENT AND PLAN:  Jayesh Marbach. Lesch is an 59yoM with a PMH of CAD s/p RCA and LAD stents (patent by [facility] 2018), DM2, HTN, chronic  anemia, hx TIA with recent admission for sepsis secondary to PNA who presented to [facility] ED 06/12/2022 with shortness of breath while working with physical therapy.  There was initial concern for pneumonia based on CTA chest findings, but also had significant peripheral edema with concern for heart failure contributing to his symptoms.  Cardiology is consulted for hospital day 4 for assistance with his CHF.  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  Initial concern for recurrent pneumonia, but presentation was felt to be more c/w a heart failure exacerbation (WBCs wnl, neg procal, and the patient is afebrile. BNP uptrending from 747 on admission to 1300 today despite brisk diuresis yesterday (net neg 4.4L total) and has been weaned off BIPAP and peripheral edema appears to be improving.  Net IO Since Admission: -9,030.33 mL [06/17/22 1225]  -Wean oxygen as tolerated, currently on 5L today  -continue IV Lasix 60 mg BID today, then reassess tomorrow.  -start GDMT with metoprolol XL 12.5mg  daily and losartan 25mg  daily -consider addition of SGLT2i & MRA as BP and renal function allow  -EF is reduced compared to most recent study 6 years ago - suspect gradual decline with known underlying CAD.  -defer additional cardiac diagnostics at this time, will arrange for follow up with Dr. Darrold Junker at discharge   # AKI on CKD 3 Renal function improving with diuresis, continue to monitor daily -Monitor and replenish electrolytes for a goal K >4  # CAD s/p RCA and LAD stents  Patent stents by [facility] 2018. Chest pain free.  Continue aspirin 81 mg daily, and Crestor 5 mg every other day  This patient's plan of care was discussed and created with Dr. Darrold Junker and he is in agreement.  Signed: Rebeca Allegra , PA-C 06/17/2022, 8:37 AM [facility]  Cardiology

## 2022-06-17 NOTE — Progress Notes (Signed)
PT Cancellation Note  Patient Details Name: Gregory Tran MRN: [id number] DOB: [birth year]   Cancelled Treatment:    Reason Eval/Treat Not Completed: Patient declined, no reason specified Patient declining therapy this date due to fatigue and inability to rest this date. Encouraged OOB mobility for improving lung function, however patient continued to decline. Will follow up at later date and time.   Maylon Peppers, PT, DPT Physical Therapist - [place]  [facility]    Daryan Buell A Illianna Paschal 06/17/2022, 2:30 PM

## 2022-06-17 NOTE — Progress Notes (Signed)
Patient requested to have bipap removed. Patient has been placed on 5liters of oxygen. Patient is confused. Made multiple attempts to reorient patient and educate about the importance of the bipap. Patient is resting without any other complaints.

## 2022-06-17 NOTE — Plan of Care (Signed)
  Problem: Clinical Measurements: Goal: Signs and symptoms of infection will decrease Outcome: Progressing   Problem: Respiratory: Goal: Ability to maintain adequate ventilation will improve Outcome: Progressing   Problem: Clinical Measurements: Goal: Respiratory complications will improve Outcome: Progressing   Problem: Clinical Measurements: Goal: Cardiovascular complication will be avoided Outcome: Progressing   Problem: Pain Managment: Goal: General experience of comfort will improve Outcome: Progressing

## 2022-06-17 NOTE — Progress Notes (Signed)
PROGRESS NOTE    [name]  YNW:[id number] DOB: Feb 26, 1935 DOA: 06/12/2022 PCP: Myrene Buddy, NP   Brief Narrative:  87 y.o. male with past medical history of CAD status post drug-eluting stent, hypertension, type 2 diabetes, chronic anemia, hyperlipidemia, TIA, BPH and recent admission for sepsis secondary to pneumonia presented hospital with shortness of breath while getting physical therapy with hypoxia.  He also had worsening lower extremity edema.  CTA was negative for PE but showed worsening bilateral lymphadenopathy pneumonia and effusion.  Patient was started on diuresis, also given 1 unit PRBC transfusion.  Initially required BiPAP due to hypercapnic and hypoxic respiratory failure.   Assessment & Plan:  Principal Problem:   Acute heart failure (HCC) Active Problems:   Coronary artery disease   Stage 3 chronic kidney disease (HCC)   Type 2 diabetes mellitus with peripheral neuropathy (HCC)   Chronic anemia     Assessment and Plan: Acute congestive heart failure with reduced ejection fraction, 40%.  Class IV Bilateral pleural effusions Signs of volume overload with reduced EF.  Patient responding well to diuresis, weaned off BiPAP.  Monitor and replete electrolytes as needed.  Cardiology team is following. Plan for GDMT-can start Aldactone/Farxiga.  Defer to cardiology Neg 9.2L. Weight 84 kg > 75kg.   Acute hypoxic and hypercapnic respiratory failure - Improved, currently on 5 L nasal cannula.  Will continue to wean this down Patient doesn't have PNA, will discontinue Levaquin. ProCal Neg.   Constipation - Has previous history of this.  Will check KUB, if needed will start him on aggressive bowel regimen  Coronary artery disease status post stents in RCA and LAD, 2018 Status post PCI in the past.  Continue aspirin and statin  Stage 3 chronic kidney disease (HCC) Creatinine stable around 1.2  Type 2 diabetes mellitus with peripheral neuropathy  (HCC) Home antiglycemic agents on hold.  On sliding scale and Accu-Chek Semglee 5 units at bedtime; will increase and add Premeal Novolog  Chronic anemia Monitor hemoglobin, stable around 8.5.  Received 1 unit PRBC earlier during this admission.  No obvious signs of active bleeding.  Essential hypertension - Home Norvasc, Toprol-XL on hold.  IV as needed   Physical therapy-recommends home health upon discharge    DVT prophylaxis: Lovenox Code Status: Full code Family Communication: Son at bedside Status is: Inpatient Continue hospital stay for diuresis. Anticipate hospital stay at least for next 2 days      Diet Orders (From admission, onward)     Start     Ordered   06/16/22 1522  Diet Heart Room service appropriate? Yes; Fluid consistency: Thin; Fluid restriction: 1500 mL Fluid  Diet effective now       Question Answer Comment  Room service appropriate? Yes   Fluid consistency: Thin   Fluid restriction: 1500 mL Fluid      06/16/22 1521            Subjective: Seen at bedside, tells me he continues to want have a bowel movement but unable to do so. Tells me his shortness of breath is significantly improved.  Son is present at bedside as well.  Tells me he has had issues with constipation in the past   Examination:  General exam: Appears calm and comfortable, 5 L nasal cannula Respiratory system: Mild B bibasilar crackles Cardiovascular system: S1 & S2 heard, RRR. No JVD, murmurs, rubs, gallops or clicks. No pedal edema. Gastrointestinal system: Abdomen is nondistended, soft and nontender. No organomegaly or  masses felt. Normal bowel sounds heard. Central nervous system: Alert and oriented. No focal neurological deficits. Extremities: Symmetric 5 x 5 power. Skin: No rashes, lesions or ulcers Psychiatry: Judgement and insight appear normal. Mood & affect appropriate.  Objective: Vitals:   06/16/22 2309 06/17/22 0305 06/17/22 0737 06/17/22 0819  BP: 131/62  (!) 151/66  (!) 148/62  Pulse: 66 69 71 69  Resp: 18 18 18 18   Temp: 99 F (37.2 C) 98.5 F (36.9 C)  97.8 F (36.6 C)  TempSrc:      SpO2: 100% 99% 98% 100%  Weight:  75.6 kg    Height:        Intake/Output Summary (Last 24 hours) at 06/17/2022 0827 Last data filed at 06/17/2022 0421 Gross per 24 hour  Intake 820 ml  Output 5300 ml  Net -4480 ml   Filed Weights   06/15/22 0655 06/16/22 0411 06/17/22 0305  Weight: 86.2 kg 86.4 kg 75.6 kg    Scheduled Meds:  aspirin EC  81 mg Oral Daily   enoxaparin (LOVENOX) injection  40 mg Subcutaneous Q24H   furosemide  60 mg Intravenous BID   gabapentin  100 mg Oral Daily   guaiFENesin  20 mL Oral Q4H   hydrocerin   Topical BID   insulin aspart  0-15 Units Subcutaneous TID WC   insulin glargine-yfgn  10 Units Subcutaneous QHS   ipratropium-albuterol  3 mL Nebulization TID   pantoprazole  40 mg Oral Daily   rosuvastatin  5 mg Oral QODAY   sodium chloride flush  3 mL Intravenous Q12H   tamsulosin  0.4 mg Oral Daily   Continuous Infusions:  sodium chloride     levofloxacin (LEVAQUIN) IV Stopped (06/15/22 1718)    Nutritional status     Body mass index is 25.34 kg/m.  Data Reviewed:   CBC: Recent Labs  Lab 06/13/22 0405 06/14/22 0311 06/15/22 0307 06/16/22 0511 06/17/22 0554  WBC 8.7 6.2 5.7 6.8 8.0  NEUTROABS 7.2  --   --   --   --   HGB 8.8* 7.8* 7.5* 8.5* 10.4*  HCT 28.6* 25.6* 25.2* 27.7* 33.9*  MCV 94.1 93.8 93.3 92.6 92.9  PLT 393 287 274 262 337   Basic Metabolic Panel: Recent Labs  Lab 06/13/22 0405 06/14/22 0311 06/15/22 0307 06/16/22 0511 06/17/22 0554  NA 141 142 137 140 141  K 4.6 4.0 3.9 3.6 3.8  CL 109 110 106 103 98  CO2 21* 25 26 27  33*  GLUCOSE 117* 155* 366* 222* 186*  BUN 19 21 24* 20 21  CREATININE 1.35* 1.59* 1.76* 1.31* 1.17  CALCIUM 8.4* 8.3* 8.1* 8.2* 8.7*  MG 1.9 2.1 2.0 1.9 2.0   GFR: Estimated Creatinine Clearance: 45.5 mL/min (by C-G formula based on SCr of 1.17  mg/dL). Liver Function Tests: Recent Labs  Lab 06/15/22 0307 06/16/22 0511  AST 20 27  ALT 20 19  ALKPHOS 54 54  BILITOT 0.6 0.8  PROT 6.2* 6.1*  ALBUMIN 2.8* 2.7*   No results for input(s): "LIPASE", "AMYLASE" in the last 168 hours. No results for input(s): "AMMONIA" in the last 168 hours. Coagulation Profile: No results for input(s): "INR", "PROTIME" in the last 168 hours. Cardiac Enzymes: No results for input(s): "CKTOTAL", "CKMB", "CKMBINDEX", "TROPONINI" in the last 168 hours. BNP (last 3 results) No results for input(s): "PROBNP" in the last 8760 hours. HbA1C: No results for input(s): "HGBA1C" in the last 72 hours. CBG: Recent Labs  Lab 06/15/22 2156  06/16/22 0829 06/16/22 1752 06/16/22 2039 06/17/22 0821  GLUCAP 228* 221* 307* 249* 201*   Lipid Profile: No results for input(s): "CHOL", "HDL", "LDLCALC", "TRIG", "CHOLHDL", "LDLDIRECT" in the last 72 hours. Thyroid Function Tests: No results for input(s): "TSH", "T4TOTAL", "FREET4", "T3FREE", "THYROIDAB" in the last 72 hours. Anemia Panel: No results for input(s): "VITAMINB12", "FOLATE", "FERRITIN", "TIBC", "IRON", "RETICCTPCT" in the last 72 hours. Sepsis Labs: Recent Labs  Lab 06/12/22 1622 06/12/22 1659  PROCALCITON  --  <0.10  LATICACIDVEN 1.8  --     Recent Results (from the past 240 hour(s))  Resp Panel by RT-PCR (Flu A&B, Covid) Anterior Nasal Swab     Status: None   Collection Time: 06/12/22  3:01 PM   Specimen: Anterior Nasal Swab  Result Value Ref Range Status   SARS Coronavirus 2 by RT PCR NEGATIVE NEGATIVE Final    Comment: (NOTE) SARS-CoV-2 target nucleic acids are NOT DETECTED.  The SARS-CoV-2 RNA is generally detectable in upper respiratory specimens during the acute phase of infection. The lowest concentration of SARS-CoV-2 viral copies this assay can detect is 138 copies/mL. A negative result does not preclude SARS-Cov-2 infection and should not be used as the sole basis for treatment  or other patient management decisions. A negative result may occur with  improper specimen collection/handling, submission of specimen other than nasopharyngeal swab, presence of viral mutation(s) within the areas targeted by this assay, and inadequate number of viral copies(<138 copies/mL). A negative result must be combined with clinical observations, patient history, and epidemiological information. The expected result is Negative.  Fact Sheet for Patients:  BloggerCourse.com  Fact Sheet for Healthcare Providers:  SeriousBroker.it  This test is no t yet approved or cleared by the [place] FDA and  has been authorized for detection and/or diagnosis of SARS-CoV-2 by FDA under an Emergency Use Authorization (EUA). This EUA will remain  in effect (meaning this test can be used) for the duration of the COVID-19 declaration under Section 564(b)(1) of the Act, 21 U.S.C.section 360bbb-3(b)(1), unless the authorization is terminated  or revoked sooner.       Influenza A by PCR NEGATIVE NEGATIVE Final   Influenza B by PCR NEGATIVE NEGATIVE Final    Comment: (NOTE) The Xpert Xpress SARS-CoV-2/FLU/RSV plus assay is intended as an aid in the diagnosis of influenza from Nasopharyngeal swab specimens and should not be used as a sole basis for treatment. Nasal washings and aspirates are unacceptable for Xpert Xpress SARS-CoV-2/FLU/RSV testing.  Fact Sheet for Patients: BloggerCourse.com  Fact Sheet for Healthcare Providers: SeriousBroker.it  This test is not yet approved or cleared by the [place] FDA and has been authorized for detection and/or diagnosis of SARS-CoV-2 by FDA under an Emergency Use Authorization (EUA). This EUA will remain in effect (meaning this test can be used) for the duration of the COVID-19 declaration under Section 564(b)(1) of the Act, 21 U.S.C. section  360bbb-3(b)(1), unless the authorization is terminated or revoked.  Performed at [facility], [address] [place], [place] [address]   Blood culture (single)     Status: None   Collection Time: 06/12/22  3:01 PM   Specimen: BLOOD  Result Value Ref Range Status   Specimen Description BLOOD BLOOD LEFT HAND  Final   Special Requests   Final    BOTTLES DRAWN AEROBIC AND ANAEROBIC Blood Culture adequate volume   Culture   Final    NO GROWTH 5 DAYS Performed at [facility], [address] [place], [place] [address]    Report Status 06/17/2022 FINAL  Final  Culture, blood (single)     Status: None   Collection Time: 06/12/22  3:10 PM   Specimen: BLOOD  Result Value Ref Range Status   Specimen Description BLOOD LEFT ANTECUBITAL  Final   Special Requests BOTTLES DRAWN AEROBIC AND ANAEROBIC BCLV  Final   Culture   Final    NO GROWTH 5 DAYS Performed at [facility], [address], [place], [place] [address]    Report Status 06/17/2022 FINAL  Final         Radiology Studies: ECHOCARDIOGRAM COMPLETE  Result Date: 06/16/2022    ECHOCARDIOGRAM REPORT   Patient Name:   Gregory Tran [facility] Date of Exam: 06/16/2022 Medical Rec #:  [id number]          Height:       68.0 in Accession #:    [id number]         Weight:       190.5 lb Date of Birth:  Jan 24, 1936          BSA:          2.002 m Patient Age:    86 years           BP:           150/75 mmHg Patient Gender: M                  HR:           69 bpm. Exam Location:  [facility] Procedure: 2D Echo, Cardiac Doppler, Color Doppler and Intracardiac            Opacification Agent Indications:     CHF  History:         Patient has no prior history of Echocardiogram examinations.                  CHF, CAD; Risk Factors:Hypertension, Diabetes and Dyslipidemia.  Sonographer:     Mikki Harbor Referring Phys:  [id number] Verdene Lennert Diagnosing Phys: Lorine Bears MD  Sonographer Comments: Suboptimal parasternal window and  Technically difficult study due to poor echo windows. Image acquisition challenging due to respiratory motion. IMPRESSIONS  1. Left ventricular ejection fraction, by estimation, is 40 to 45%. The left ventricle has mildly decreased function. The left ventricle demonstrates regional wall motion abnormalities (see scoring diagram/findings for description). Left ventricular diastolic parameters are indeterminate. There is severe hypokinesis of the left ventricular, mid-apical anterior wall and apical segment.  2. Right ventricular systolic function is normal. The right ventricular size is normal.  3. Left atrial size was mildly dilated.  4. The mitral valve is normal in structure. No evidence of mitral valve regurgitation. No evidence of mitral stenosis.  5. The aortic valve is normal in structure. Aortic valve regurgitation is not visualized. No aortic stenosis is present.  6. The inferior vena cava is normal in size with <50% respiratory variability, suggesting right atrial pressure of 8 mmHg. FINDINGS  Left Ventricle: Left ventricular ejection fraction, by estimation, is 40 to 45%. The left ventricle has mildly decreased function. The left ventricle demonstrates regional wall motion abnormalities. Severe hypokinesis of the left ventricular, mid-apical  anterior wall and apical segment. Definity contrast agent was given IV to delineate the left ventricular endocardial borders. The left ventricular internal cavity size was normal in size. There is no left ventricular hypertrophy. Left ventricular diastolic parameters are indeterminate. Right Ventricle: The right ventricular size  is normal. No increase in right ventricular wall thickness. Right ventricular systolic function is normal. Left Atrium: Left atrial size was mildly dilated. Right Atrium: Right atrial size was normal in size. Pericardium: There is no evidence of pericardial effusion. Mitral Valve: The mitral valve is normal in structure. No evidence of mitral  valve regurgitation. No evidence of mitral valve stenosis. MV peak gradient, 5.6 mmHg. The mean mitral valve gradient is 2.0 mmHg. Tricuspid Valve: The tricuspid valve is normal in structure. Tricuspid valve regurgitation is not demonstrated. No evidence of tricuspid stenosis. Aortic Valve: The aortic valve is normal in structure. Aortic valve regurgitation is not visualized. No aortic stenosis is present. Aortic valve mean gradient measures 4.0 mmHg. Aortic valve peak gradient measures 7.0 mmHg. Aortic valve area, by VTI measures 2.50 cm. Pulmonic Valve: The pulmonic valve was normal in structure. Pulmonic valve regurgitation is not visualized. No evidence of pulmonic stenosis. Aorta: The aortic root is normal in size and structure. Venous: The inferior vena cava is normal in size with less than 50% respiratory variability, suggesting right atrial pressure of 8 mmHg. IAS/Shunts: No atrial level shunt detected by color flow Doppler.  LEFT VENTRICLE PLAX 2D LVIDd:         5.60 cm   Diastology LVIDs:         3.70 cm   LV e' medial:    6.42 cm/s LV PW:         1.20 cm   LV E/e' medial:  16.2 LV IVS:        1.10 cm   LV e' lateral:   9.68 cm/s LVOT diam:     2.00 cm   LV E/e' lateral: 10.7 LV SV:         85 LV SV Index:   42 LVOT Area:     3.14 cm  RIGHT VENTRICLE RV Basal diam:  3.90 cm RV Mid diam:    3.40 cm LEFT ATRIUM             Index        RIGHT ATRIUM           Index LA diam:        4.10 cm 2.05 cm/m   RA Area:     19.40 cm LA Vol (A2C):   73.6 ml 36.77 ml/m  RA Volume:   50.00 ml  24.98 ml/m LA Vol (A4C):   71.7 ml 35.82 ml/m LA Biplane Vol: 77.8 ml 38.86 ml/m  AORTIC VALVE AV Area (Vmax):    2.57 cm AV Area (Vmean):   2.32 cm AV Area (VTI):     2.50 cm AV Vmax:           132.00 cm/s AV Vmean:          95.300 cm/s AV VTI:            0.338 m AV Peak Grad:      7.0 mmHg AV Mean Grad:      4.0 mmHg LVOT Vmax:         108.00 cm/s LVOT Vmean:        70.300 cm/s LVOT VTI:          0.269 m LVOT/AV VTI  ratio: 0.80  AORTA Ao Root diam: 3.70 cm MITRAL VALVE MV Area (PHT): 4.74 cm     SHUNTS MV Area VTI:   2.39 cm     Systemic VTI:  0.27 m MV Peak grad:  5.6 mmHg  Systemic Diam: 2.00 cm MV Mean grad:  2.0 mmHg MV Vmax:       1.18 m/s MV Vmean:      61.4 cm/s MV Decel Time: 160 msec MV E velocity: 104.00 cm/s MV A velocity: 100.00 cm/s MV E/A ratio:  1.04 Lorine Bears MD Electronically signed by Lorine Bears MD Signature Date/Time: 06/16/2022/5:43:40 PM    Final    DG Chest Port 1 View  Result Date: 06/15/2022 CLINICAL DATA:  Increased shortness of breath. EXAM: PORTABLE CHEST 1 VIEW COMPARISON:  Radiograph 05/21/2022. Chest CT 06/12/2022 FINDINGS: The heart is enlarged. Bilateral pleural effusions, right greater than left. Developing right left perihilar opacity. Worsening pulmonary edema with increasing septal thickening and diffuse pulmonary opacity. No pneumothorax. IMPRESSION: 1. CHF with cardiomegaly, increasing pulmonary edema, and bilateral pleural effusions. 2. Developing right perihilar opacity, may be asymmetric pulmonary edema or pneumonia. Electronically Signed   By: Narda Rutherford M.D.   On: 06/15/2022 13:46           LOS: 5 days   Time spent= 35 mins    Itay Mella Joline Maxcy, MD [facility]  If 7PM-7AM, please contact night-coverage  06/17/2022, 8:27 AM

## 2022-06-17 NOTE — Consult Note (Addendum)
PHARMACY CONSULT NOTE  Pharmacy Consult for Electrolyte Monitoring and Replacement   Recent Labs: Potassium (mmol/L)  Date Value  06/17/2022 3.8  06/20/2011 4.3   Magnesium (mg/dL)  Date Value  16/11/9602 2.0   Calcium (mg/dL)  Date Value  54/10/8117 8.7 (L)   Calcium, Total (mg/dL)  Date Value  14/78/2956 9.2   Albumin (g/dL)  Date Value  21/30/8657 2.7 (L)   Sodium (mmol/L)  Date Value  06/17/2022 141  06/20/2011 138   Assessment: Patient is an 87 y/o F with medical history including CAD s/p DES, HTN, DM, chronic anemia, HLD, TIA, BPH, recent admission secondary to pneumonia admitted with acute heart failure / respiratory failure. Pharmacy consulted to assist with electrolyte monitoring and replacement as indicated.  Diuresis: IV Lasix 60 mg BID  Goal of Therapy:  Electrolytes within normal limits  Plan:  --K 3.6 >> 3.8, patient was started on high dose IV Lasix yesterday and received Kcl 40 mEq PO x 2 doses and potassium essentially the same. Will go ahead and given Kcl 40 mEq PO x 2 doses again today --Follow-up electrolytes with AM labs tomorrow as ordered  Tressie Ellis 06/17/2022 8:44 AM

## 2022-06-17 NOTE — Plan of Care (Signed)

## 2022-06-18 ENCOUNTER — Telehealth (HOSPITAL_COMMUNITY): Payer: Self-pay | Admitting: Pharmacy Technician

## 2022-06-18 ENCOUNTER — Other Ambulatory Visit (HOSPITAL_COMMUNITY): Payer: Self-pay

## 2022-06-18 ENCOUNTER — Encounter: Payer: Self-pay | Admitting: Hematology and Oncology

## 2022-06-18 DIAGNOSIS — I5021 Acute systolic (congestive) heart failure: Secondary | ICD-10-CM | POA: Diagnosis not present

## 2022-06-18 DIAGNOSIS — Z794 Long term (current) use of insulin: Secondary | ICD-10-CM

## 2022-06-18 DIAGNOSIS — I5023 Acute on chronic systolic (congestive) heart failure: Secondary | ICD-10-CM | POA: Insufficient documentation

## 2022-06-18 DIAGNOSIS — J9601 Acute respiratory failure with hypoxia: Secondary | ICD-10-CM | POA: Insufficient documentation

## 2022-06-18 DIAGNOSIS — E1165 Type 2 diabetes mellitus with hyperglycemia: Secondary | ICD-10-CM

## 2022-06-18 LAB — BASIC METABOLIC PANEL
Anion gap: 8 (ref 5–15)
BUN: 31 mg/dL — ABNORMAL HIGH (ref 8–23)
CO2: 32 mmol/L (ref 22–32)
Calcium: 8.5 mg/dL — ABNORMAL LOW (ref 8.9–10.3)
Chloride: 99 mmol/L (ref 98–111)
Creatinine, Ser: 1.32 mg/dL — ABNORMAL HIGH (ref 0.61–1.24)
GFR, Estimated: 53 mL/min — ABNORMAL LOW (ref >60)
Glucose, Bld: 248 mg/dL — ABNORMAL HIGH (ref 70–99)
Potassium: 4.3 mmol/L (ref 3.5–5.1)
Sodium: 139 mmol/L (ref 135–145)

## 2022-06-18 LAB — FERRITIN: Ferritin: 16 ng/mL — ABNORMAL LOW (ref 24–336)

## 2022-06-18 LAB — CBC
HCT: 30.6 % — ABNORMAL LOW (ref 39.0–52.0)
Hemoglobin: 9.3 g/dL — ABNORMAL LOW (ref 13.0–17.0)
MCH: 28.1 pg (ref 26.0–34.0)
MCHC: 30.4 g/dL (ref 30.0–36.0)
MCV: 92.4 fL (ref 80.0–100.0)
Platelets: 284 10*3/uL (ref 150–400)
RBC: 3.31 MIL/uL — ABNORMAL LOW (ref 4.22–5.81)
RDW: 15.7 % — ABNORMAL HIGH (ref 11.5–15.5)
WBC: 6.5 10*3/uL (ref 4.0–10.5)
nRBC: 0 % (ref 0.0–0.2)

## 2022-06-18 LAB — GLUCOSE, CAPILLARY
Glucose-Capillary: 224 mg/dL — ABNORMAL HIGH (ref 70–99)
Glucose-Capillary: 253 mg/dL — ABNORMAL HIGH (ref 70–99)
Glucose-Capillary: 251 mg/dL — ABNORMAL HIGH (ref 70–99)
Glucose-Capillary: 321 mg/dL — ABNORMAL HIGH (ref 70–99)

## 2022-06-18 LAB — MAGNESIUM: Magnesium: 1.8 mg/dL (ref 1.7–2.4)

## 2022-06-18 LAB — IRON AND TIBC
Iron: 100 ug/dL (ref 45–182)
Saturation Ratios: 34 % (ref 17.9–39.5)
TIBC: 291 ug/dL (ref 250–450)
UIBC: 191 ug/dL

## 2022-06-18 LAB — VITAMIN B12: Vitamin B-12: 222 pg/mL (ref 180–914)

## 2022-06-18 MED ORDER — DAPAGLIFLOZIN PROPANEDIOL 10 MG PO TABS
10.0000 mg | ORAL_TABLET | Freq: Every day | ORAL | Status: DC
  Administered 2022-06-18 – 2022-06-19 (×2): 10 mg via ORAL
  Filled 2022-06-18 (×2): qty 1

## 2022-06-18 MED ORDER — TORSEMIDE 20 MG PO TABS
20.0000 mg | ORAL_TABLET | Freq: Every day | ORAL | Status: DC
  Administered 2022-06-18: 20 mg via ORAL
  Filled 2022-06-18: qty 1

## 2022-06-18 MED ORDER — INSULIN GLARGINE-YFGN 100 UNIT/ML ~~LOC~~ SOLN
18.0000 [IU] | Freq: Every day | SUBCUTANEOUS | Status: DC
  Administered 2022-06-18: 18 [IU] via SUBCUTANEOUS
  Filled 2022-06-18 (×2): qty 0.18

## 2022-06-18 MED ORDER — MAGNESIUM SULFATE 2 GM/50ML IV SOLN
2.0000 g | Freq: Once | INTRAVENOUS | Status: AC
  Administered 2022-06-18: 2 g via INTRAVENOUS
  Filled 2022-06-18: qty 50

## 2022-06-18 MED ORDER — INSULIN ASPART 100 UNIT/ML IJ SOLN
5.0000 [IU] | Freq: Three times a day (TID) | INTRAMUSCULAR | Status: DC
  Administered 2022-06-18 – 2022-06-19 (×2): 5 [IU] via SUBCUTANEOUS
  Filled 2022-06-18 (×2): qty 1

## 2022-06-18 NOTE — Progress Notes (Signed)
  Progress Note   Patient: Gregory Tran ZOX:[id number] DOB: 01/12/1936 DOA: 06/12/2022     6 DOS: the patient was seen and examined on 06/18/2022   Brief hospital course: 86 y.o. male with past medical history of CAD status post drug-eluting stent, hypertension, type 2 diabetes, chronic anemia, hyperlipidemia, TIA, BPH and recent admission for sepsis secondary to pneumonia presented hospital with shortness of breath while getting physical therapy with hypoxia.  He also had worsening lower extremity edema.  CTA was negative for PE but showed worsening bilateral lymphadenopathy pneumonia and effusion.  Patient was started on diuresis, also given 1 unit PRBC transfusion.  Initially required BiPAP due to hypercapnic and hypoxic respiratory failure.    Principal Problem:   Acute heart failure (HCC) Active Problems:   Coronary artery disease   CKD stage 3a, GFR 45-59 ml/min (HCC)   Type 2 diabetes mellitus with peripheral neuropathy (HCC)   Chronic anemia   Assessment and Plan:  Acute congestive heart failure with reduced ejection fraction, 40%.  Class IV Bilateral pleural effusions Acute hypoxic and hypercapnic respiratory failure Patient condition continue improved, currently on 3 L oxygen.  Renal function slightly worsened today, diuretics changed to oral per cardiology. Plan GDMT per cardiology. Patient still required BiPAP last night, will check overnight oximetry to see if patient needs BiPAP at home.     Constipation Resolved after large bowel movement yesterday.   Coronary artery disease status post stents in RCA and LAD, 2018 Status post PCI in the past.  Continue aspirin and statin   Stage 3 chronic kidney disease (HCC) Creatinine stable around 1.2   Uncontrolled type 2 diabetes with hyperglycemia. Increase insulin glargine dose, also increase the dose for scheduled NovoLog.   Chronic anemia Monitor hemoglobin, stable around 8.5.  Received 1 unit PRBC earlier during  this admission.  No obvious signs of active bleeding.   Essential hypertension      Subjective:  Patient feels better today, short of breath better.  No cough.  Physical Exam: Vitals:   06/18/22 0749 06/18/22 0806 06/18/22 1124 06/18/22 1423  BP: (!) 129/57  (!) 126/47   Pulse: 66 65 65   Resp: 20 18 18    Temp: 97.7 F (36.5 C)  98.2 F (36.8 C)   TempSrc: Oral  Oral   SpO2: 98% 98% 94% 95%  Weight:      Height:       General exam: Appears calm and comfortable  Respiratory system: Clear to auscultation. Respiratory effort normal. Cardiovascular system: S1 & S2 heard, RRR. No JVD, murmurs, rubs, gallops or clicks. No pedal edema. Gastrointestinal system: Abdomen is nondistended, soft and nontender. No organomegaly or masses felt. Normal bowel sounds heard. Central nervous system: Alert and oriented. No focal neurological deficits. Extremities: Symmetric 5 x 5 power. Skin: No rashes, lesions or ulcers Psychiatry: Judgement and insight appear normal. Mood & affect appropriate.    Data Reviewed:  Lab results reviewed.  Family Communication: Son updated at bedside.  Disposition: Status is: Inpatient Remains inpatient appropriate because: Severity of disease,     Time spent: 35 minutes  Author: Marrion Coy, MD 06/18/2022 2:32 PM  For on call review [email or web address].

## 2022-06-18 NOTE — Consult Note (Signed)
PHARMACY CONSULT NOTE  Pharmacy Consult for Electrolyte Monitoring and Replacement   Recent Labs: Potassium (mmol/L)  Date Value  06/18/2022 4.3  06/20/2011 4.3   Magnesium (mg/dL)  Date Value  16/11/9602 1.8   Calcium (mg/dL)  Date Value  54/10/8117 8.5 (L)   Calcium, Total (mg/dL)  Date Value  14/78/2956 9.2   Albumin (g/dL)  Date Value  21/30/8657 2.7 (L)   Sodium (mmol/L)  Date Value  06/18/2022 139  06/20/2011 138   Assessment: Patient is an 87 y/o F with medical history including CAD s/p DES, HTN, DM, chronic anemia, HLD, TIA, BPH, recent admission secondary to pneumonia admitted with acute heart failure / respiratory failure. Pharmacy consulted to assist with electrolyte monitoring and replacement as indicated.  Diuresis: IV Lasix 60 mg BID  Goal of Therapy:  Electrolytes within normal limits  Plan:  Mg 2 g IV x 1  F/u with AM labs.   Ronnald Ramp, PharmD, BCPS 06/18/2022 6:13 PM

## 2022-06-18 NOTE — Plan of Care (Signed)
?  Problem: Health Behavior/Discharge Planning: ?Goal: Ability to manage health-related needs will improve ?Outcome: Progressing ?  ?Problem: Clinical Measurements: ?Goal: Will remain free from infection ?Outcome: Progressing ?  ?Problem: Clinical Measurements: ?Goal: Respiratory complications will improve ?Outcome: Progressing ?  ?Problem: Clinical Measurements: ?Goal: Cardiovascular complication will be avoided ?Outcome: Progressing ?  ?Problem: Pain Managment: ?Goal: General experience of comfort will improve ?Outcome: Progressing ?  ?Problem: Safety: ?Goal: Ability to remain free from injury will improve ?Outcome: Progressing ?  ?

## 2022-06-18 NOTE — Progress Notes (Signed)
[facility] CARDIOLOGY CONSULT NOTE       Patient ID: Gregory Tran MRN: [id number] DOB/AGE: 85-Apr-1937 85 y.o.  Admit date: 06/12/2022 Referring Physician Dr. Joycelyn Das  Primary Physician Lenon Oms, NP  Primary Cardiologist previous Dr. Lady Gary  Reason for Consultation new HF  HPI: Gregory Tran is an 77yoM with a PMH of CAD s/p RCA and LAD stents (patent by [facility] 2018), DM2, HTN, chronic anemia, hx TIA with recent admission for sepsis secondary to PNA who presented to [facility] ED 06/12/2022 with shortness of breath while working with physical therapy.  There was initial concern for pneumonia based on CTA chest findings, but also had significant peripheral edema with concern for heart failure contributing to his symptoms.  Cardiology is consulted for hospital day 4 for assistance with his CHF. Echo resulted with reduced EF 40-45%, severe HK of LV mid apical anterior wall segment and apical segment   Interval History: - laying at low incline in bed, sleeping soundly - no shortness of breath, chest pain, peripheral edema improved. Less coughing than in previous days - diuresing well    Review of systems complete and found to be negative unless listed above     Past Medical History:  Diagnosis Date   Arthritis    BPH (benign prostatic hyperplasia)    Cancer (HCC)    Basal Cell   Chronic kidney disease    Coronary artery disease    Diabetes mellitus (HCC)    Ejaculatory disorder    Erectile dysfunction    Frequency    GERD (gastroesophageal reflux disease)    Gross hematuria    Heart disease    Hematuria    HTN (hypertension)    Hyperlipidemia    Incomplete bladder emptying    Microscopic hematuria    Myocardial infarction (HCC)    Neuropathy    Nocturia    Rotator cuff tear left   TIA (transient ischemic attack)    Tick bite of multiple sites 5 days ago   chest, and groin area   Vertigo    1-2x/yr    Past Surgical History:  Procedure Laterality Date    cardiac stents     CATARACT EXTRACTION W/PHACO Left 08/02/2018   Procedure: CATARACT EXTRACTION PHACO AND INTRAOCULAR LENS PLACEMENT (IOC)  LEFT DIABETIC;  Surgeon: Nevada Crane, MD;  Location: [facility];  Service: Ophthalmology;  Laterality: Left;  diabetes - insulin and oral meds   CATARACT EXTRACTION W/PHACO Right 10/11/2018   Procedure: CATARACT EXTRACTION PHACO AND INTRAOCULAR LENS PLACEMENT (IOC) RIGHT DIABETES;  Surgeon: Nevada Crane, MD;  Location: [facility];  Service: Ophthalmology;  Laterality: Right;  Diabetic - insulin and oral meds   CORONARY ANGIOPLASTY  2001, 2008   [facility]   DUPUYTREN CONTRACTURE RELEASE Right 04/30/2016   Procedure: DUPUYTREN CONTRACTURE RELEASE;  Surgeon: Erin Sons, MD;  Location: [facility] OR[facility];  Service: Orthopedics;  Laterality: Right;   KYPHOPLASTY N/A 01/26/2018   Procedure: Pearletha Furl;  Surgeon: Kennedy Bucker, MD;  Location: [facility] OR[facility];  Service: Orthopedics;  Laterality: N/A;   ROTATOR CUFF REPAIR Bilateral    TRANSURETHRAL RESECTION OF PROSTATE N/A 07/09/2015   Procedure: TRANSURETHRAL RESECTION OF THE PROSTATE (TURP);  Surgeon: Vanna Scotland, MD;  Location: [facility] OR[facility];  Service: Urology;  Laterality: N/A;    Medications Prior to Admission  Medication Sig Dispense Refill Last Dose   amLODipine (NORVASC) 10 MG tablet Take 5 mg by mouth daily.   06/12/2022   ammonium lactate (AMLACTIN)  12 % cream Apply 1 Application topically 2 (two) times daily. (Apply to back)   Past Week   aspirin EC 81 MG tablet Take 81 mg by mouth daily.    06/12/2022   diphenhydrAMINE (BENADRYL) 25 mg capsule Take 1 capsule (25 mg total) by mouth every 6 (six) hours as needed for itching, sleep or allergies. 30 capsule 0 unknown   gabapentin (NEURONTIN) 100 MG capsule Take 100 mg by mouth daily.   06/12/2022   insulin aspart (NOVOLOG) 100 UNIT/ML FlexPen Inject 3 Units into the skin 3 (three) times daily with meals. 15 mL 3 06/12/2022   insulin degludec  (TRESIBA) 100 UNIT/ML FlexTouch Pen Inject 10 Units into the skin daily.   06/12/2022   metFORMIN (GLUCOPHAGE) 1000 MG tablet Take 1,000 mg by mouth daily.   06/12/2022   metoprolol succinate (TOPROL-XL) 25 MG 24 hr tablet Take 12.5 mg by mouth daily.   06/12/2022   pantoprazole (PROTONIX) 40 MG tablet Take 40 mg by mouth daily.    06/12/2022   rosuvastatin (CRESTOR) 5 MG tablet Take 5 mg by mouth every other day.   1 Past Week   tamsulosin (FLOMAX) 0.4 MG CAPS capsule TAKE 1 CAPSULE(0.4 MG) BY MOUTH DAILY (Patient taking differently: Take 0.4 mg by mouth daily.) 30 capsule 3    Social History   Socioeconomic History   Marital status: Single    Spouse name: Not on file   Number of children: Not on file   Years of education: Not on file   Highest education level: Not on file  Occupational History   Not on file  Tobacco Use   Smoking status: Former    Packs/day: 1.00    Years: 50.00    Additional pack years: 0.00    Total pack years: 50.00    Types: Cigarettes    Quit date: 06/26/2017    Years since quitting: 4.9   Smokeless tobacco: Never  Vaping Use   Vaping Use: Never used  Substance and Sexual Activity   Alcohol use: No    Alcohol/week: 0.0 standard drinks of alcohol   Drug use: No   Sexual activity: Not on file  Other Topics Concern   Not on file  Social History Narrative   Not on file   Social Determinants of Health   Financial Resource Strain: Not on file  Food Insecurity: No Food Insecurity (05/22/2022)   Hunger Vital Sign    Worried About Running Out of Food in the Last Year: Never true    Ran Out of Food in the Last Year: Never true  Transportation Needs: No Transportation Needs (05/22/2022)   PRAPARE - Administrator, Civil Service (Medical): No    Lack of Transportation (Non-Medical): No  Physical Activity: Not on file  Stress: Not on file  Social Connections: Not on file  Intimate Partner Violence: Not At Risk (05/22/2022)   Humiliation, Afraid, Rape,  and Kick questionnaire    Fear of Current or Ex-Partner: No    Emotionally Abused: No    Physically Abused: No    Sexually Abused: No    Family History  Problem Relation Age of Onset   Ovarian cancer Sister    Kidney disease Brother        born one kidney   Bladder Cancer Neg Hx    Prostate cancer Neg Hx       Intake/Output Summary (Last 24 hours) at 06/18/2022 1191 Last data filed at 06/17/2022 2200 Gross  per 24 hour  Intake 720 ml  Output 1450 ml  Net -730 ml     Vitals:   06/17/22 2348 06/18/22 0357 06/18/22 0749 06/18/22 0806  BP: 126/62 127/60 (!) 129/57   Pulse: 68 62 66 65  Resp: 18 16 20 18   Temp: 98.5 F (36.9 C) 97.7 F (36.5 C) 97.7 F (36.5 C)   TempSrc: Oral  Oral   SpO2: 100% 100% 98% 98%  Weight:  76.5 kg    Height:        PHYSICAL EXAM General: Elderly and frail-appearing Caucasian male, in no acute distress.  Sitting upright in bed without family present. Generally dry, flaky skin  HEENT:  Normocephalic and atraumatic. Neck:  No JVD.  Lungs: Normal respiratory effort on 4L [place]. Decreased breath sounds, with trace expiratory wheezing on the right  Heart: HRRR . Normal S1 and S2 without gallops or murmurs.  Abdomen: Non-distended appearing.  Msk: Normal strength and tone for age. Extremities: Warm and well perfused. No clubbing, cyanosis.  Much improved bilateral lower extremity edema with skin dimpling and wrinkling.  Neuro: Awake and alert Psych:  Answers questions appropriately, but is a limited historian   Labs: Basic Metabolic Panel: Recent Labs    06/17/22 0554 06/18/22 0343  NA 141 139  K 3.8 4.3  CL 98 99  CO2 33* 32  GLUCOSE 186* 248*  BUN 21 31*  CREATININE 1.17 1.32*  CALCIUM 8.7* 8.5*  MG 2.0 1.8    Liver Function Tests: Recent Labs    06/16/22 0511  AST 27  ALT 19  ALKPHOS 54  BILITOT 0.8  PROT 6.1*  ALBUMIN 2.7*    No results for input(s): "LIPASE", "AMYLASE" in the last 72 hours. CBC: Recent Labs     06/17/22 0554 06/18/22 0343  WBC 8.0 6.5  HGB 10.4* 9.3*  HCT 33.9* 30.6*  MCV 92.9 92.4  PLT 337 284    Cardiac Enzymes: No results for input(s): "CKTOTAL", "CKMB", "CKMBINDEX", "TROPONINIHS" in the last 72 hours. BNP: Recent Labs    06/17/22 0554  BNP 1,369.4*   D-Dimer: No results for input(s): "DDIMER" in the last 72 hours. Hemoglobin A1C: No results for input(s): "HGBA1C" in the last 72 hours. Fasting Lipid Panel: No results for input(s): "CHOL", "HDL", "LDLCALC", "TRIG", "CHOLHDL", "LDLDIRECT" in the last 72 hours. Thyroid Function Tests: No results for input(s): "TSH", "T4TOTAL", "T3FREE", "THYROIDAB" in the last 72 hours.  Invalid input(s): "FREET3" Anemia Panel: No results for input(s): "VITAMINB12", "FOLATE", "FERRITIN", "TIBC", "IRON", "RETICCTPCT" in the last 72 hours.   Radiology: DG Abd 1 View  Result Date: 06/17/2022 CLINICAL DATA:  Abdominal distention. EXAM: ABDOMEN - 1 VIEW COMPARISON:  Chest x-ray dated June 15, 2022. CT abdomen pelvis dated May 21, 2022. FINDINGS: The bowel gas pattern is normal. No radio-opaque calculi or other significant radiographic abnormality are seen. No acute osseous abnormality. Prior L4 and L5 cement augmentation. Pulmonary edema seems improved. Unchanged small bilateral pleural effusions. IMPRESSION: 1. No acute findings. Electronically Signed   By: Obie Dredge M.D.   On: 06/17/2022 14:59   ECHOCARDIOGRAM COMPLETE  Result Date: 06/16/2022    ECHOCARDIOGRAM REPORT   Patient Name:   Gregory Tran [facility] Date of Exam: 06/16/2022 Medical Rec #:  [id number]          Height:       68.0 in Accession #:    [id number]         Weight:  190.5 lb Date of Birth:  [birth year]          BSA:          2.002 m Patient Age:    86 years           BP:           150/75 mmHg Patient Gender: M                  HR:           69 bpm. Exam Location:  [facility] Procedure: 2D Echo, Cardiac Doppler, Color Doppler and Intracardiac            Opacification Agent  Indications:     CHF  History:         Patient has no prior history of Echocardiogram examinations.                  CHF, CAD; Risk Factors:Hypertension, Diabetes and Dyslipidemia.  Sonographer:     Mikki Harbor Referring Phys:  [id number] Verdene Lennert Diagnosing Phys: Lorine Bears MD  Sonographer Comments: Suboptimal parasternal window and Technically difficult study due to poor echo windows. Image acquisition challenging due to respiratory motion. IMPRESSIONS  1. Left ventricular ejection fraction, by estimation, is 40 to 45%. The left ventricle has mildly decreased function. The left ventricle demonstrates regional wall motion abnormalities (see scoring diagram/findings for description). Left ventricular diastolic parameters are indeterminate. There is severe hypokinesis of the left ventricular, mid-apical anterior wall and apical segment.  2. Right ventricular systolic function is normal. The right ventricular size is normal.  3. Left atrial size was mildly dilated.  4. The mitral valve is normal in structure. No evidence of mitral valve regurgitation. No evidence of mitral stenosis.  5. The aortic valve is normal in structure. Aortic valve regurgitation is not visualized. No aortic stenosis is present.  6. The inferior vena cava is normal in size with <50% respiratory variability, suggesting right atrial pressure of 8 mmHg. FINDINGS  Left Ventricle: Left ventricular ejection fraction, by estimation, is 40 to 45%. The left ventricle has mildly decreased function. The left ventricle demonstrates regional wall motion abnormalities. Severe hypokinesis of the left ventricular, mid-apical  anterior wall and apical segment. Definity contrast agent was given IV to delineate the left ventricular endocardial borders. The left ventricular internal cavity size was normal in size. There is no left ventricular hypertrophy. Left ventricular diastolic parameters are indeterminate. Right Ventricle: The right ventricular  size is normal. No increase in right ventricular wall thickness. Right ventricular systolic function is normal. Left Atrium: Left atrial size was mildly dilated. Right Atrium: Right atrial size was normal in size. Pericardium: There is no evidence of pericardial effusion. Mitral Valve: The mitral valve is normal in structure. No evidence of mitral valve regurgitation. No evidence of mitral valve stenosis. MV peak gradient, 5.6 mmHg. The mean mitral valve gradient is 2.0 mmHg. Tricuspid Valve: The tricuspid valve is normal in structure. Tricuspid valve regurgitation is not demonstrated. No evidence of tricuspid stenosis. Aortic Valve: The aortic valve is normal in structure. Aortic valve regurgitation is not visualized. No aortic stenosis is present. Aortic valve mean gradient measures 4.0 mmHg. Aortic valve peak gradient measures 7.0 mmHg. Aortic valve area, by VTI measures 2.50 cm. Pulmonic Valve: The pulmonic valve was normal in structure. Pulmonic valve regurgitation is not visualized. No evidence of pulmonic stenosis. Aorta: The aortic root is normal in size and structure. Venous: The inferior vena cava is normal  in size with less than 50% respiratory variability, suggesting right atrial pressure of 8 mmHg. IAS/Shunts: No atrial level shunt detected by color flow Doppler.  LEFT VENTRICLE PLAX 2D LVIDd:         5.60 cm   Diastology LVIDs:         3.70 cm   LV e' medial:    6.42 cm/s LV PW:         1.20 cm   LV E/e' medial:  16.2 LV IVS:        1.10 cm   LV e' lateral:   9.68 cm/s LVOT diam:     2.00 cm   LV E/e' lateral: 10.7 LV SV:         85 LV SV Index:   42 LVOT Area:     3.14 cm  RIGHT VENTRICLE RV Basal diam:  3.90 cm RV Mid diam:    3.40 cm LEFT ATRIUM             Index        RIGHT ATRIUM           Index LA diam:        4.10 cm 2.05 cm/m   RA Area:     19.40 cm LA Vol (A2C):   73.6 ml 36.77 ml/m  RA Volume:   50.00 ml  24.98 ml/m LA Vol (A4C):   71.7 ml 35.82 ml/m LA Biplane Vol: 77.8 ml 38.86  ml/m  AORTIC VALVE AV Area (Vmax):    2.57 cm AV Area (Vmean):   2.32 cm AV Area (VTI):     2.50 cm AV Vmax:           132.00 cm/s AV Vmean:          95.300 cm/s AV VTI:            0.338 m AV Peak Grad:      7.0 mmHg AV Mean Grad:      4.0 mmHg LVOT Vmax:         108.00 cm/s LVOT Vmean:        70.300 cm/s LVOT VTI:          0.269 m LVOT/AV VTI ratio: 0.80  AORTA Ao Root diam: 3.70 cm MITRAL VALVE MV Area (PHT): 4.74 cm     SHUNTS MV Area VTI:   2.39 cm     Systemic VTI:  0.27 m MV Peak grad:  5.6 mmHg     Systemic Diam: 2.00 cm MV Mean grad:  2.0 mmHg MV Vmax:       1.18 m/s MV Vmean:      61.4 cm/s MV Decel Time: 160 msec MV E velocity: 104.00 cm/s MV A velocity: 100.00 cm/s MV E/A ratio:  1.04 Lorine Bears MD Electronically signed by Lorine Bears MD Signature Date/Time: 06/16/2022/5:43:40 PM    Final    DG Chest Port 1 View  Result Date: 06/15/2022 CLINICAL DATA:  Increased shortness of breath. EXAM: PORTABLE CHEST 1 VIEW COMPARISON:  Radiograph 05/21/2022. Chest CT 06/12/2022 FINDINGS: The heart is enlarged. Bilateral pleural effusions, right greater than left. Developing right left perihilar opacity. Worsening pulmonary edema with increasing septal thickening and diffuse pulmonary opacity. No pneumothorax. IMPRESSION: 1. CHF with cardiomegaly, increasing pulmonary edema, and bilateral pleural effusions. 2. Developing right perihilar opacity, may be asymmetric pulmonary edema or pneumonia. Electronically Signed   By: Narda Rutherford M.D.   On: 06/15/2022 13:46   CT Angio Chest PE W  and/or Wo Contrast  Result Date: 06/12/2022 CLINICAL DATA:  Short of breath, productive cough EXAM: CT ANGIOGRAPHY CHEST WITH CONTRAST TECHNIQUE: Multidetector CT imaging of the chest was performed using the standard protocol during bolus administration of intravenous contrast. Multiplanar CT image reconstructions and MIPs were obtained to evaluate the vascular anatomy. RADIATION DOSE REDUCTION: This exam was  performed according to the departmental dose-optimization program which includes automated exposure control, adjustment of the mA and/or kV according to patient size and/or use of iterative reconstruction technique. CONTRAST:  75mL OMNIPAQUE IOHEXOL 350 MG/ML SOLN COMPARISON:  05/21/2022 FINDINGS: Cardiovascular: This is a technically adequate evaluation of the pulmonary vasculature. No filling defects or pulmonary emboli. The heart is unremarkable without pericardial effusion. There is dense atherosclerosis of the coronary vasculature greatest in the LAD and right coronary distribution. No evidence of thoracic aortic aneurysm or dissection. Stable atherosclerosis of the aorta. Mediastinum/Nodes: Stable appearance of the thyroid. Mucoid material layers dependently within the bilateral mainstem bronchi. The esophagus is unremarkable. Mild bilateral axillary lymphadenopathy has progressed since prior study. Index lymph node adjacent to the right subclavian vein reference image 39/4 measures 11 mm, previously measuring 8 mm. Multiple subcentimeter mediastinal and hilar lymph nodes are again noted. Lungs/Pleura: Interval development of bilateral pleural effusions, volume estimated less than 1 L each. Multifocal bilateral ground-glass airspace disease has progressed, right greater than left. There is bilateral bronchial wall thickening. No pneumothorax. Upper Abdomen: No acute abnormality. Musculoskeletal: No acute or destructive bony lesions. Reconstructed images demonstrate no additional findings. Review of the MIP images confirms the above findings. IMPRESSION: 1. No evidence of pulmonary embolus. 2. Progressive multifocal bilateral airspace disease and bronchial wall thickening, consistent with worsening pneumonia. 3. Progressive mediastinal, hilar, and axillary lymphadenopathy, likely reactive. 4. Bilateral pleural effusions, new since prior exam, volume estimated less than 1 L each. 5. Aortic Atherosclerosis  (ICD10-I70.0). Coronary artery atherosclerosis. Electronically Signed   By: Sharlet Salina M.D.   On: 06/12/2022 16:09   CT CHEST ABDOMEN PELVIS WO CONTRAST  Result Date: 05/21/2022 CLINICAL DATA:  Sepsis, fever, hyperglycemia EXAM: CT CHEST, ABDOMEN AND PELVIS WITHOUT CONTRAST TECHNIQUE: Multidetector CT imaging of the chest, abdomen and pelvis was performed following the standard protocol without IV contrast. RADIATION DOSE REDUCTION: This exam was performed according to the departmental dose-optimization program which includes automated exposure control, adjustment of the mA and/or kV according to patient size and/or use of iterative reconstruction technique. COMPARISON:  None Available. FINDINGS: CT CHEST FINDINGS Cardiovascular: Extensive multi-vessel coronary artery calcification. Cardiac size within normal limits. No pericardial effusion. Central pulmonary arteries are of normal caliber. Extensive atherosclerotic calcification within the thoracic aorta. Mediastinum/Nodes: The visualized thyroid is unremarkable. No pathologic thoracic adenopathy. The distal esophagus appears mildly circumferentially thick walled small amount of fluid is seen layering within the mid esophagus which may reflect changes of esophagitis in the setting of gastroesophageal reflux or esophageal dysmotility. This is not well assessed on this examination. Lungs/Pleura: Reticulonodular infiltrates have progressed within the lung bases bilaterally in keeping with atypical infection or aspiration. Trace superimposed interstitial pulmonary edema and bilateral pleural effusions are present. No pneumothorax. No central obstructing lesion. Bronchial wall thickening in keeping with airway inflammation noted. Musculoskeletal: No acute bone abnormality. No lytic or blastic bone lesion. CT ABDOMEN PELVIS FINDINGS Hepatobiliary: No focal liver abnormality is seen. No gallstones, gallbladder wall thickening, or biliary dilatation. Pancreas:  Unremarkable Spleen: Unremarkable Adrenals/Urinary Tract: The adrenal glands are unremarkable. The kidneys are normal in size and position. Vascular calcifications noted  within the renal hila bilaterally. No definite urinary renal or ureteral calculi. The bladder is distended but is otherwise unremarkable. Stomach/Bowel: Moderate colonic stool burden without evidence of obstruction. Stomach, small bowel, and large bowel are otherwise unremarkable. Appendix normal. No free intraperitoneal gas or fluid. Vascular/Lymphatic: Extensive aortoiliac atherosclerotic calcification. Particularly prominent atherosclerotic calcification noted at the origin of the superior mesenteric artery, however, the degree of stenosis is not well assessed on this noncontrast examination. Extensive calcification also noted within the lower extremity arterial inflow bilaterally. No aortic aneurysm. No pathologic adenopathy within the abdomen and pelvis. Reproductive: Moderate prostatic hypertrophy. Other: No abdominal wall hernia. Musculoskeletal: L4 and L5 vertebral augmentation has been performed. Osseous structures are otherwise age-appropriate. No acute bone abnormality. No lytic or blastic bone lesion. IMPRESSION: 1. Progressive reticulonodular infiltrates within the lung bases bilaterally in keeping with atypical infection or aspiration. 2. Trace superimposed interstitial pulmonary edema and bilateral pleural effusions. 3. Extensive multi-vessel coronary artery calcification. 4. Mild circumferentially thick walled distal esophagus which may reflect changes of esophagitis in the setting of gastroesophageal reflux or esophageal dysmotility. Correlation with endoscopy may be helpful for further evaluation. 5. Moderate colonic stool burden without evidence of obstruction. 6. Extensive aortoiliac atherosclerotic calcification. Particularly prominent atherosclerotic calcification at the origin of the superior mesenteric artery, however, the  degree of stenosis is not well assessed on this noncontrast examination. If there is clinical evidence of chronic mesenteric ischemia, CT arteriography may be helpful for further evaluation. 7. Moderate prostatic hypertrophy. Aortic Atherosclerosis (ICD10-I70.0). Electronically Signed   By: Helyn Numbers M.D.   On: 05/21/2022 23:00   CT HEAD WO CONTRAST ( )  Result Date: 05/21/2022 CLINICAL DATA:  Headache. EXAM: CT HEAD WITHOUT CONTRAST TECHNIQUE: Contiguous axial images were obtained from the base of the skull through the vertex without intravenous contrast. RADIATION DOSE REDUCTION: This exam was performed according to the departmental dose-optimization program which includes automated exposure control, adjustment of the mA and/or kV according to patient size and/or use of iterative reconstruction technique. COMPARISON:  None Available. FINDINGS: Brain: There is periventricular white matter decreased attenuation consistent with small vessel ischemic changes. Ventricles, sulci and cisterns are prominent consistent with age related involutional changes. No acute intracranial hemorrhage, mass effect or shift. No hydrocephalus. Vascular: No hyperdense vessel or unexpected calcification. Skull: Normal. Negative for fracture or focal lesion. Sinuses/Orbits: No acute finding. IMPRESSION: Atrophy and chronic small vessel ischemic changes. No acute intracranial process identified. Electronically Signed   By: Layla Maw M.D.   On: 05/21/2022 22:08   DG Chest Port 1 View  Result Date: 05/21/2022 CLINICAL DATA:  Sepsis, hyperglycemia, febrile EXAM: PORTABLE CHEST 1 VIEW COMPARISON:  04/03/2022 FINDINGS: Single frontal view of the chest demonstrates an unremarkable cardiac silhouette. There is diffuse increased interstitial prominence, with patchy bibasilar airspace disease. No effusion or pneumothorax. No acute bony abnormalities. IMPRESSION: 1. Diffuse interstitial prominence with patchy bibasilar airspace  disease, which may reflect multifocal pneumonia or edema. Electronically Signed   By: Sharlet Salina M.D.   On: 05/21/2022 21:40    ECHO EF >55% 2018   [facility] 01/2017 FINDINGS   Coronary Angiography  Dominance: Right   Left Main:  The left main coronary artery (LMCA) is a large-caliber vessel  that originates from the left coronary sinus. It bifurcates into the left  anterior descending (LAD) and left circumflex (LCx) arteries. There is a  20% stenosis in the mid LMCA.   LAD:  The LAD is a large-caliber vessel that  gives off 2 diagonal (D)  branches before it wraps around the apex. D1 is a moderate-to-large  caliber vessel without significant disease. D2 is a small-caliber vessel.  There is patent stent in the mid LAD with mild to moderate in-stent  restenosis most notably in the proximal portion.  There is a 60% stenosis  distal to LAD stent   Left Circumflex:  The LCx is a large-caliber vessel that gives off 2  obtuse marginal (OM) branches and then continues as a small vessel in the  AV groove. OM1 is a small-caliber vessel with a 50-60% ostial stenosis.  OM2 is a large-caliber branching vessel with diffuse luminal  irregularities. There are diffuse luminal irregularities throughout the  LCx.   Right Coronary:  The right coronary artery (RCA) is a large-caliber vessel  originating from the right coronary sinus. It bifurcates distally into the  posterior descending artery (PDA) and 4 posterolateral (PL) branches  consistent with a right dominant system. The PDA is a moderate caliber  vesse. PL1 and PL4 are both moderate caliber vessells. PL2 and PL3 are  small vessels. There a long diffuse segment of disease in the proximal to  mid RCA with stenosis up to 60% with patient stent in the mid to distal  RCA. Mild in-stent restenosis is noted.   TELEMETRY reviewed by me (LT) 06/18/2022 : Sinus rhythm with PACs, rate 60s-70s with motion artifact  EKG reviewed by me: NSR rate 94  bpm, artifact  Data reviewed by me (LT) 06/18/2022: hospitalist progress note, last 24h vitals tele labs imaging I/O   Principal Problem:   Acute heart failure (HCC) Active Problems:   Coronary artery disease   Chronic anemia   Stage 3 chronic kidney disease (HCC)   Type 2 diabetes mellitus with peripheral neuropathy (HCC)    ASSESSMENT AND PLAN:  [place]. Baumgardner is an 67yoM with a PMH of CAD s/p RCA and LAD stents (patent by [facility] 2018), DM2, HTN, chronic anemia, hx TIA with recent admission for sepsis secondary to PNA who presented to [facility] ED 06/12/2022 with shortness of breath while working with physical therapy.  There was initial concern for pneumonia based on CTA chest findings, but also had significant peripheral edema with concern for heart failure contributing to his symptoms.  Cardiology is consulted for hospital day 4 for assistance with his CHF.  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  Initial concern for recurrent pneumonia, but presentation was felt to be more c/w a heart failure exacerbation (WBCs wnl, neg procal, no fever. BNP uptrending from 747 on admission to 1300 4/30 despite brisk diuresis. Clinical improvement in volume status after diuresis. Net IO Since Admission: -10,000.33 mL [06/18/22 0814]  -Wean oxygen as tolerated, currently on 4L today  -hold further IV diuretics today. Start torsemide 20mg  daily -continue GDMT with metoprolol XL 12.5mg  daily and losartan 25mg  daily -start farxiga 10mg  daily -consider MRA as BP and renal function allow  -EF is reduced compared to most recent study 6 years ago - suspect gradual decline with known underlying CAD.  -defer additional cardiac diagnostics at this time, will arrange for follow up with Dr. Darrold Junker at discharge  - need to mobilize / OOB as tolerated, he  was reluctant to work with PT yesterday   # AKI on CKD 3 Renal function ok. Current BUN/CR 31/1.31 GFR 53.  -  Monitor and replenish electrolytes for a goal K >4  # CAD s/p RCA and LAD stents  Patent stents by [facility] 2018. Chest pain free.  Continue aspirin 81 mg daily, and Crestor 5 mg every other day  This patient's plan of care was discussed and created with Dr. Juliann Pares and he is in agreement.  Signed: Rebeca Allegra , PA-C 06/18/2022, 8:14 AM [facility]

## 2022-06-18 NOTE — Telephone Encounter (Signed)
Pharmacy Patient Advocate Encounter  Insurance verification completed.    The patient is insured through [employer] Part D   The patient is currently admitted and ran test claims for the following: Farxiga.  Copays and coinsurance results were relayed to Inpatient clinical team.

## 2022-06-18 NOTE — Inpatient Diabetes Management (Signed)
Inpatient Diabetes Program Recommendations  AACE/ADA: New Consensus Statement on Inpatient Glycemic Control (2015)  Target Ranges:  Prepandial:   less than 140 mg/dL      Peak postprandial:   less than 180 mg/dL (1-2 hours)      Critically ill patients:  140 - 180 mg/dL   Lab Results  Component Value Date   GLUCAP 224 (H) 06/18/2022   HGBA1C 5.9 (H) 05/22/2022    Latest Reference Range & Units 06/15/22 21:56 06/16/22 08:29 06/16/22 17:52 06/16/22 20:39 06/17/22 08:21 06/17/22 12:33 06/17/22 15:27 06/17/22 21:25 06/18/22 07:48  Glucose-Capillary 70 - 99 mg/dL 914 (H) 782 (H) 956 (H) 249 (H) 201 (H) 269 (H) 354 (H) 265 (H) 224 (H)  (H): Data is abnormally high  Diabetes history: DM2 Outpatient Diabetes medications: Tresiba 10, Nov 3 tid, Met 1 gm qd.  Current orders for Inpatient glycemic control: Semglee 10, Nov 3 tid, 0-15 tid   Inpatient Diabetes Program Recommendations:   Please consider: -Increase Semglee to 12 units qd -Increase Novolog correction to 4 units tid if eats 50%  Thank you, Darel Hong E. Crayton Savarese, RN, MSN, CDE  Diabetes Coordinator Inpatient Glycemic Control Team Team Pager [phone] (8am-5pm) 06/18/2022 10:01 AM

## 2022-06-18 NOTE — Plan of Care (Signed)

## 2022-06-18 NOTE — Progress Notes (Signed)
Occupational Therapy Treatment Patient Details Name: Gregory Tran MRN: [id number] DOB: [birth year] Today's Date: 06/18/2022   History of present illness Pt is an 87 y.o. male presenting to hospital 06/12/22 with c/o SOB/respiratory distress when laying flat (working with physical therapy). Recent admission for PNA and sepsis; receiving HHPT  Pt admitted with acute heart failure.  PMH includes DM, htn, CAD s/p DES, chronic anemia, HLD, TIA, BPH, CKD, MI, neuropathy, RC tear, vertigo, R dupuytren contracture release, L4-5 kyphoplasty.   OT comments  Upon entering the room, pt reclined in recliner and sleeping soundly. Pt alerts to therapist calling his name after multiple attempts. Pt reports feeling very tired and having sat up in recliner chair for some time and requesting to return to bed. Pt stands with min A from recliner chair and min guard stand pivot transfer to EOB. Pt endorses very dry and itchy skin being painful for him. OT provided lotion and pt applied to UEs, thighs, and knees with min guard for sitting balance and increased time. OT assisted with applying to B LEs and back. Sit >supine with mod A for B LEs and guidance of trunk for safety. Bed alarm activated and all needs within reach at end of session.    Recommendations for follow up therapy are one component of a multi-disciplinary discharge planning process, led by the attending physician.  Recommendations may be updated based on patient status, additional functional criteria and insurance authorization.    Assistance Recommended at Discharge Frequent or constant Supervision/Assistance  Patient can return home with the following  A little help with walking and/or transfers;A little help with bathing/dressing/bathroom;Assistance with cooking/housework;Direct supervision/assist for medications management;Direct supervision/assist for financial management;Assist for transportation;Help with stairs or ramp for entrance   Equipment  Recommendations  Other (comment) (drop arm commode chair)       Precautions / Restrictions Precautions Precautions: Fall Restrictions Weight Bearing Restrictions: No       Mobility Bed Mobility Overal bed mobility: Needs Assistance Bed Mobility: Sit to Supine       Sit to supine: Mod assist   General bed mobility comments: assistance for B LEs and trunk guidance    Transfers Overall transfer level: Needs assistance Equipment used: Rolling walker (2 wheels) Transfers: Sit to/from Stand Sit to Stand: Min assist                 Balance Overall balance assessment: Modified Independent Sitting-balance support: No upper extremity supported, Feet supported Sitting balance-Leahy Scale: Good     Standing balance support: Bilateral upper extremity supported, During functional activity Standing balance-Leahy Scale: Fair                             ADL either performed or assessed with clinical judgement   ADL Overall ADL's : Needs assistance/impaired                                       General ADL Comments: Pt sitting EOB and applying lotion to UEs, thighs, and knees with min guard for sitting balance.    Extremity/Trunk Assessment Upper Extremity Assessment Upper Extremity Assessment: Generalized weakness   Lower Extremity Assessment Lower Extremity Assessment: Generalized weakness        Vision Patient Visual Report: No change from baseline            Cognition Arousal/Alertness: Lethargic,  Awake/alert Behavior During Therapy: WFL for tasks assessed/performed Overall Cognitive Status: Within Functional Limits for tasks assessed                                                     Pertinent Vitals/ Pain       Pain Assessment Pain Assessment: No/denies pain         Frequency  Min 1X/week        Progress Toward Goals  OT Goals(current goals can now be found in the care plan section)   Progress towards OT goals: Progressing toward goals     Plan Discharge plan remains appropriate;Frequency remains appropriate       AM-PAC OT "6 Clicks" Daily Activity     Outcome Measure   Help from another person eating meals?: None Help from another person taking care of personal grooming?: A Little Help from another person toileting, which includes using toliet, bedpan, or urinal?: A Little Help from another person bathing (including washing, rinsing, drying)?: A Little Help from another person to put on and taking off regular upper body clothing?: None Help from another person to put on and taking off regular lower body clothing?: A Little 6 Click Score: 20    End of Session Equipment Utilized During Treatment: Rolling walker (2 wheels)  OT Visit Diagnosis: History of falling (Z91.81);Repeated falls (R29.6);Muscle weakness (generalized) (M62.81);Unsteadiness on feet (R26.81)   Activity Tolerance Patient limited by fatigue   Patient Left in bed;with call bell/phone within reach;with bed alarm set   Nurse Communication Mobility status        Time: 1610-9604 OT Time Calculation (min): 15 min  Charges: OT General Charges $OT Visit: 1 Visit OT Treatments $Self Care/Home Management : 8-22 mins  Jackquline Denmark, MS, OTR/L , CBIS ascom [phone]  06/18/22, 3:46 PM

## 2022-06-18 NOTE — Progress Notes (Signed)
Physical Therapy Treatment Patient Details Name: Gregory Tran MRN: [id number] DOB: 1935/04/22 Today's Date: 06/18/2022   History of Present Illness Pt is an 87 y.o. male presenting to hospital 06/12/22 with c/o SOB/respiratory distress when laying flat (working with physical therapy). Recent admission for PNA and sepsis; receiving HHPT  Pt admitted with acute heart failure.  PMH includes DM, htn, CAD s/p DES, chronic anemia, HLD, TIA, BPH, CKD, MI, neuropathy, RC tear, vertigo, R dupuytren contracture release, L4-5 kyphoplasty.    PT Comments    Chart reviewed, RN consulted. Pt has been up to chair for a while. He is asleep on entry, 3L donned, VSS. Pt remains fatigued, asks for a pass from PT, but Gregory Tran is like 'you have skipped up the last 2 days, Gregory Tran- we gotta do something. Anything is better than nothing!' Gregory Tran is agreeable. Author facilitates reposition in chair. Pt partakes in seated ROM exercises and core strength, he gradually alerts throughout. We finish with 5 sequential STS transfers, tolerated well but from a vitals standpoint as well as pt report. Pt assisted back to recliner, dry linen placed, legs elevated. Noted Rt peri gluteal fold wound open, ~49mm diameter circular- RN aware of this. Son in briefly, for updates then out again. Will continue to follow.    Recommendations for follow up therapy are one component of a multi-disciplinary discharge planning process, led by the attending physician.  Recommendations may be updated based on patient status, additional functional criteria and insurance authorization.  Follow Up Recommendations       Assistance Recommended at Discharge Frequent or constant Supervision/Assistance  Patient can return home with the following A little help with walking and/or transfers;A little help with bathing/dressing/bathroom;Assistance with cooking/housework;Direct supervision/assist for medications management;Assist for transportation;Help with  stairs or ramp for entrance   Equipment Recommendations  None recommended by PT    Recommendations for Other Services       Precautions / Restrictions Precautions Precautions: Fall Restrictions Weight Bearing Restrictions: No     Mobility  Bed Mobility                    Transfers Overall transfer level: Needs assistance Equipment used: Rolling walker (2 wheels) Transfers: Sit to/from Stand Sit to Stand: Supervision           General transfer comment: no cues given, able to problem solve hand placement based on level of need for UE use    Ambulation/Gait Ambulation/Gait assistance:  (deferred, pt remains fatigued and sleepy, but is agreeable to limited session)                 Stairs             Wheelchair Mobility    Modified Rankin (Stroke Patients Only)       Balance Overall balance assessment: Modified Independent                                          Cognition Arousal/Alertness: Awake/alert Behavior During Therapy: WFL for tasks assessed/performed Overall Cognitive Status: Within Functional Limits for tasks assessed                                          Exercises Total Joint Exercises Towel Squeeze: AROM, Both, Seated, 10 reps,  Limitations Towel Squeeze Limitations: Not following cues for rep and hold times; author ends up counting for patient as an expression of partnership General Exercises - Lower Extremity Long Arc Quad: AROM, Both, 10 reps Hip Flexion/Marching: AROM, AAROM, Both, 10 reps, Seated, Limitations Hip Flexion/Marching Limitations: AA/ROM required on Right side for full available range Heel Raises: AROM, Both, 15 reps, Seated Other Exercises Other Exercises: STS from recliner to RW: 5x10-15sec (no LOB, normal sway, excellent tolerance) sats WNL on 3L /min    General Comments        Pertinent Vitals/Pain Pain Assessment Pain Assessment: No/denies pain    Home  Living                          Prior Function            PT Goals (current goals can now be found in the care plan section) Acute Rehab PT Goals Patient Stated Goal: to improve breathing and strength PT Goal Formulation: With patient Time For Goal Achievement: 06/27/22 Potential to Achieve Goals: Good Progress towards PT goals: Progressing toward goals    Frequency    Min 3X/week      PT Plan Current plan remains appropriate    Co-evaluation              AM-PAC PT "6 Clicks" Mobility   Outcome Measure  Help needed turning from your back to your side while in a flat bed without using bedrails?: None Help needed moving from lying on your back to sitting on the side of a flat bed without using bedrails?: None Help needed moving to and from a bed to a chair (including a wheelchair)?: A Little Help needed standing up from a chair using your arms (e.g., wheelchair or bedside chair)?: A Little Help needed to walk in hospital room?: A Little Help needed climbing 3-5 steps with a railing? : A Little 6 Click Score: 20    End of Session Equipment Utilized During Treatment: Oxygen Activity Tolerance: Patient limited by fatigue;Patient tolerated treatment well Patient left: in chair;with call bell/phone within reach;with chair alarm set;with family/visitor present   PT Visit Diagnosis: Other abnormalities of gait and mobility (R26.89);Muscle weakness (generalized) (M62.81);History of falling (Z91.81)     Time: 1610-9604 PT Time Calculation (min) (ACUTE ONLY): 26 min  Charges:  $Therapeutic Exercise: 23-37 mins                    2:34 PM, 06/18/22 Gregory Tran, PT, DPT Physical Therapist - [facility]  [phone] (ASCOM)    Gregory Tran C 06/18/2022, 2:30 PM

## 2022-06-18 NOTE — TOC Benefit Eligibility Note (Signed)
Patient Product/process development scientist completed.    The patient is currently admitted and upon discharge could be taking Farxiga 10 mg.  The current 30 day co-pay is $234.37 due to a deductible.   The patient is insured through [employer] Part D   This test claim was processed through [facility]- copay amounts may vary at other pharmacies due to pharmacy/plan contracts, or as the patient moves through the different stages of their insurance plan.  Roland Earl, CPHT Pharmacy Patient Advocate Specialist [facility] Patient Advocate Team Direct Number: [phone]  Fax: [phone]

## 2022-06-19 DIAGNOSIS — Z794 Long term (current) use of insulin: Secondary | ICD-10-CM | POA: Diagnosis not present

## 2022-06-19 DIAGNOSIS — E1165 Type 2 diabetes mellitus with hyperglycemia: Secondary | ICD-10-CM | POA: Diagnosis not present

## 2022-06-19 DIAGNOSIS — I5023 Acute on chronic systolic (congestive) heart failure: Secondary | ICD-10-CM | POA: Diagnosis not present

## 2022-06-19 DIAGNOSIS — J9601 Acute respiratory failure with hypoxia: Secondary | ICD-10-CM

## 2022-06-19 LAB — GLUCOSE, CAPILLARY: Glucose-Capillary: 233 mg/dL — ABNORMAL HIGH (ref 70–99)

## 2022-06-19 LAB — CBC
HCT: 32.5 % — ABNORMAL LOW (ref 39.0–52.0)
Hemoglobin: 10 g/dL — ABNORMAL LOW (ref 13.0–17.0)
MCH: 28.4 pg (ref 26.0–34.0)
MCHC: 30.8 g/dL (ref 30.0–36.0)
MCV: 92.3 fL (ref 80.0–100.0)
Platelets: 304 10*3/uL (ref 150–400)
RBC: 3.52 MIL/uL — ABNORMAL LOW (ref 4.22–5.81)
RDW: 15.9 % — ABNORMAL HIGH (ref 11.5–15.5)
WBC: 6.2 10*3/uL (ref 4.0–10.5)
nRBC: 0 % (ref 0.0–0.2)

## 2022-06-19 LAB — BASIC METABOLIC PANEL
Anion gap: 11 (ref 5–15)
BUN: 37 mg/dL — ABNORMAL HIGH (ref 8–23)
CO2: 32 mmol/L (ref 22–32)
Calcium: 8.8 mg/dL — ABNORMAL LOW (ref 8.9–10.3)
Chloride: 99 mmol/L (ref 98–111)
Creatinine, Ser: 1.5 mg/dL — ABNORMAL HIGH (ref 0.61–1.24)
GFR, Estimated: 46 mL/min — ABNORMAL LOW (ref >60)
Glucose, Bld: 211 mg/dL — ABNORMAL HIGH (ref 70–99)
Potassium: 4.1 mmol/L (ref 3.5–5.1)
Sodium: 142 mmol/L (ref 135–145)

## 2022-06-19 LAB — BLOOD GAS, ARTERIAL
Bicarbonate: 30.9 mmol/L — ABNORMAL HIGH (ref 20.0–28.0)
O2 Content: 4 L/min
O2 Saturation: 95.8 %
Patient temperature: 37
pCO2 arterial: 51 mmHg — ABNORMAL HIGH (ref 32–48)
pH, Arterial: 7.39 (ref 7.35–7.45)
pO2, Arterial: 69 mmHg — ABNORMAL LOW (ref 83–108)

## 2022-06-19 LAB — MAGNESIUM: Magnesium: 2.6 mg/dL — ABNORMAL HIGH (ref 1.7–2.4)

## 2022-06-19 MED ORDER — MEDIHONEY WOUND/BURN DRESSING EX PSTE
1.0000 | PASTE | Freq: Every day | CUTANEOUS | Status: DC
  Administered 2022-06-19: 1 via TOPICAL
  Filled 2022-06-19: qty 44

## 2022-06-19 MED ORDER — INSULIN DEGLUDEC 100 UNIT/ML ~~LOC~~ SOPN: 18.0000 [IU] | PEN_INJECTOR | Freq: Every day | SUBCUTANEOUS | Status: DC

## 2022-06-19 MED ORDER — TORSEMIDE 40 MG PO TABS: 40.0000 mg | ORAL_TABLET | Freq: Every day | ORAL | 0 refills | Status: DC

## 2022-06-19 MED ORDER — VITAMIN B-12 1000 MCG PO TABS
1000.0000 ug | ORAL_TABLET | Freq: Every day | ORAL | Status: DC
  Administered 2022-06-19: 1000 ug via ORAL
  Filled 2022-06-19: qty 1

## 2022-06-19 MED ORDER — CYANOCOBALAMIN 1000 MCG PO TABS: 1000.0000 ug | ORAL_TABLET | Freq: Every day | ORAL | 0 refills | Status: DC

## 2022-06-19 MED ORDER — LOSARTAN POTASSIUM 25 MG PO TABS: 25.0000 mg | ORAL_TABLET | Freq: Every day | ORAL | 0 refills | Status: DC

## 2022-06-19 MED ORDER — COMBIVENT RESPIMAT 20-100 MCG/ACT IN AERS: 1.0000 | INHALATION_SPRAY | Freq: Four times a day (QID) | RESPIRATORY_TRACT | 0 refills | Status: DC

## 2022-06-19 MED ORDER — DAPAGLIFLOZIN PROPANEDIOL 10 MG PO TABS: 10.0000 mg | ORAL_TABLET | Freq: Every day | ORAL | 0 refills | Status: DC

## 2022-06-19 NOTE — Discharge Instructions (Signed)
Prescribed Lasix, hold for 2 days before start. Follow-up with PCP 1 week Follow-up with cardiology as scheduled. Low-salt diet. Daily weight, call PCP if weight gain of 2 pounds.

## 2022-06-19 NOTE — Consult Note (Signed)
PHARMACY CONSULT NOTE  Pharmacy Consult for Electrolyte Monitoring and Replacement   Recent Labs: Potassium (mmol/L)  Date Value  06/19/2022 4.1  06/20/2011 4.3   Magnesium (mg/dL)  Date Value  29/56/2130 2.6 (H)   Calcium (mg/dL)  Date Value  86/57/8469 8.8 (L)   Calcium, Total (mg/dL)  Date Value  62/95/2841 9.2   Albumin (g/dL)  Date Value  32/44/0102 2.7 (L)   Sodium (mmol/L)  Date Value  06/19/2022 142  06/20/2011 138   Assessment: Patient is an 87 y/o F with medical history including CAD s/p DES, HTN, DM, chronic anemia, HLD, TIA, BPH, recent admission secondary to pneumonia admitted with acute heart failure / respiratory failure. Pharmacy consulted to assist with electrolyte monitoring and replacement as indicated.  Diuresis: IV Lasix 60 mg BID - stopped. 5/1. Scr bumped 1.17 > 1.32 > 1.5. patient transitioned to torsemide 20 mg daily. Patient's baseline 1.3 to 1.4.   Goal of Therapy:  Electrolytes within normal limits  Plan:  No replacement needed.  F/u with AM labs.   Ronnald Ramp, PharmD, BCPS 06/19/2022 7:30 AM

## 2022-06-19 NOTE — Progress Notes (Signed)
SATURATION QUALIFICATIONS: (This note is used to comply with regulatory documentation for home oxygen)  Patient Saturations on Room Air at Rest =96%  Patient Saturations on Room Air while Ambulating = 93%  Patient Saturations on 0 Liters of oxygen while Ambulating = 93%  Please briefly explain why patient needs home oxygen: 

## 2022-06-19 NOTE — TOC Transition Note (Signed)
Transition of Care [facility]) - CM/SW Discharge Note   Patient Details  Name: Gregory Tran MRN: [id number] Date of Birth: 1935/07/03  Transition of Care [facility]) CM/SW Contact:  Truddie Hidden, RN Phone Number: 06/19/2022, 12:21 PM   Clinical Narrative:    Discharge orders received. Spoke with patient's POA, Phill Myron will transport patient home He was advised [facility] would contact him to schedule patient's appointment for resumption of care.  Barbara Cower from [place] notified of discharge.  TOC signing off.          Patient Goals and CMS Choice      Discharge Placement                         Discharge Plan and Services Additional resources added to the After Visit Summary for                                       Social Determinants of Health (SDOH) Interventions SDOH Screenings   Food Insecurity: No Food Insecurity (05/22/2022)  Housing: Medium Risk (05/22/2022)  Transportation Needs: No Transportation Needs (05/22/2022)  Utilities: Not At Risk (05/22/2022)  Tobacco Use: Medium Risk (06/13/2022)     Readmission Risk Interventions    05/23/2022    3:47 PM  Readmission Risk Prevention Plan  Transportation Screening Complete  HRI or Home Care Consult Complete  Palliative Care Screening Not Applicable  Medication Review (RN Care Manager) Complete

## 2022-06-19 NOTE — Consult Note (Addendum)
WOC Nurse Consult Note: Reason for Consult: R buttock ulcer  Wound type: Unstageable Pressure Injury  Pressure Injury POA: No  Measurement: 3 cms x 3 cms x 0.1 cm  Wound bed: 100% yellow slough  Drainage (amount, consistency, odor) minimal serosanguinous  Periwound: some maceration noted  Dressing procedure/placement/frequency: Clean R buttock PI with NS, apply Medihoney to wound bed daily, cover with dry gauze and silicone foam.  May lift silicone foam daily to replace Medihoney.  Change foam dressing q3 days and prn soiling.    Patient noted to have approximately 8 cm x 8 cm area of partial thickness skin loss to mid back, has allergic appearing component to it.  Patient is ordered Hydrocortisone cream to back, legs and arms as needed for itching.  This area is covered by a foam dressing which would be appropriate treatment at this time.   Also noted severe dry peeling skin to bilateral lower legs and feet. Heels with no pressure injury however peeling.  This RN did wash patients legs with soap and water, dry and apply Eucerin cream to bilateral legs and feet which is already ordered and at bedside.   Low air loss mattress ordered for pressure redistribution and moisture management.  POC discussed with patient and bedside nurse.    This Pressure Injury was not noted on admission.  WOC team will follow patient weekly to monitor wound status and adjust wound therapy as appropriate.    Thank you,    Priscella Mann MSN, RN-BC, [employer] [phone]

## 2022-06-19 NOTE — Discharge Summary (Signed)
Physician Discharge Summary   Patient: Gregory Tran MRN: [id number] DOB: [birth year]  Admit date:     06/12/2022  Discharge date: 06/19/22  Discharge Physician: Marrion Coy   PCP: Myrene Buddy, NP   Recommendations at discharge:   Follow-up with PCP in 1 week. Follow-up with cardiology in 1 week. Check a BMP at next office visit. Follow-up on homocystine level.  Discharge Diagnoses: Principal Problem:   Acute heart failure (HCC) Active Problems:   Coronary artery disease   CKD stage 3a, GFR 45-59 ml/min (HCC)   Type 2 diabetes mellitus with peripheral neuropathy (HCC)   Chronic anemia   Uncontrolled type 2 diabetes mellitus with hyperglycemia, with long-term current use of insulin (HCC)   Acute hypoxemic respiratory failure (HCC)   Acute on chronic systolic CHF (congestive heart failure) (HCC)  Resolved Problems:   * No resolved hospital problems. *  Hospital Course: 87 y.o. male with past medical history of CAD status post drug-eluting stent, hypertension, type 2 diabetes, chronic anemia, hyperlipidemia, TIA, BPH and recent admission for sepsis secondary to pneumonia presented hospital with shortness of breath while getting physical therapy with hypoxia.  He also had worsening lower extremity edema.  CTA was negative for PE but showed worsening bilateral lymphadenopathy pneumonia and effusion.  Patient was started on diuresis, also given 1 unit PRBC transfusion.  Initially required BiPAP due to hypercapnic and hypoxic respiratory failure.   Assessment and Plan: Acute congestive heart failure with reduced ejection fraction, 40%.  Class IV Bilateral pleural effusions Acute hypoxic and hypercapnic respiratory failure Patient oxygenation gradually improving, renal function getting worse, discussed with cardiology, Lasix will be on hold for the next 2 days.  Cardiology has cleared patient for discharge.  Patient initially required BiPAP in the hospital.  Spoke with the  respite therapist, patient has been refusing BiPAP for the last 2 nights, he could not tolerate the BiPAP.  At this point, we will not be able to set up home BiPAP. Patient is adamant that he will go home today.  Will set up home PT/OT, home oxygen evaluation.     Constipation Resolved after large bowel movement yesterday.   Coronary artery disease status post stents in RCA and LAD, 2018 Status post PCI in the past.  Continue aspirin and statin   Stage 3 chronic kidney disease (HCC) Creatinine worsened to 1.5 today, hold off diuretics for 2 days.   Uncontrolled type 2 diabetes with hyperglycemia. Insulin dose increased, metformin restarted.   Chronic anemia Monitor hemoglobin, stable around 8.5.  Received 1 unit PRBC earlier during this admission.  No obvious signs of active bleeding. Level level borderline low, homocystine level is pending.  Prescribed B12 orally.   Essential hypertension       Consultants: Cardiology Procedures performed: None  Disposition: Home health Diet recommendation:  Discharge Diet Orders (From admission, onward)     Start     Ordered   06/19/22 0000  Diet - low sodium heart healthy        06/19/22 1058           Cardiac diet DISCHARGE MEDICATION: Allergies as of 06/19/2022       Reactions   Atorvastatin Other (See Comments)   B Complex-folic Acid Hives        Medication List     STOP taking these medications    amLODipine 10 MG tablet Commonly known as: NORVASC       TAKE these medications    ammonium  lactate 12 % cream Commonly known as: AMLACTIN Apply 1 Application topically 2 (two) times daily. (Apply to back)   aspirin EC 81 MG tablet Take 81 mg by mouth daily.   Combivent Respimat 20-100 MCG/ACT Aers respimat Generic drug: Ipratropium-Albuterol Inhale 1 puff into the lungs every 6 (six) hours.   cyanocobalamin 1000 MCG tablet Take 1 tablet (1,000 mcg total) by mouth daily. Start taking on: Jun 20, 2022    dapagliflozin propanediol 10 MG Tabs tablet Commonly known as: FARXIGA Take 1 tablet (10 mg total) by mouth daily. Start taking on: Jun 20, 2022   diphenhydrAMINE 25 mg capsule Commonly known as: BENADRYL Take 1 capsule (25 mg total) by mouth every 6 (six) hours as needed for itching, sleep or allergies.   gabapentin 100 MG capsule Commonly known as: NEURONTIN Take 100 mg by mouth daily.   insulin aspart 100 UNIT/ML FlexPen Commonly known as: NOVOLOG Inject 3 Units into the skin 3 (three) times daily with meals.   insulin degludec 100 UNIT/ML FlexTouch Pen Commonly known as: TRESIBA Inject 18 Units into the skin daily. What changed: how much to take   losartan 25 MG tablet Commonly known as: COZAAR Take 1 tablet (25 mg total) by mouth daily. Start taking on: Jun 20, 2022   metFORMIN 1000 MG tablet Commonly known as: GLUCOPHAGE Take 1,000 mg by mouth daily.   metoprolol succinate 25 MG 24 hr tablet Commonly known as: TOPROL-XL Take 12.5 mg by mouth daily.   pantoprazole 40 MG tablet Commonly known as: PROTONIX Take 40 mg by mouth daily.   rosuvastatin 5 MG tablet Commonly known as: CRESTOR Take 5 mg by mouth every other day.   tamsulosin 0.4 MG Caps capsule Commonly known as: FLOMAX TAKE 1 CAPSULE(0.4 MG) BY MOUTH DAILY What changed: See the new instructions.   Torsemide 40 MG Tabs Take 40 mg by mouth daily. Start taking on: Jun 22, 2022               Discharge Care Instructions  (From admission, onward)           Start     Ordered   06/19/22 0000  Discharge wound care:       Comments: Clean R buttock Pressure Injury  with NS, apply Medihoney to wound bed daily, cover with dry gauze and silicone foam.  May lift silicone foam daily to replace Medihoney.  Change foam dressing q3 days and prn soiling   06/19/22 1058            Follow-up Information     Paraschos, Alexander, MD. Go in 1 week(s).   Specialty: Cardiology Contact  information: [address] [facility] [place] [address] [phone]         Myrene Buddy, NP Follow up in 1 week(s).   Specialty: Internal Medicine Contact information: [address] [facility] [place] [address] [phone]                Discharge Exam: Filed Weights   06/17/22 0305 06/18/22 0357 06/19/22 0611  Weight: 75.6 kg 76.5 kg 76.3 kg   General exam: Appears calm and comfortable  Respiratory system: Some rhonchi in the base.Marland Kitchen Respiratory effort normal. Cardiovascular system: S1 & S2 heard, RRR. No JVD, murmurs, rubs, gallops or clicks. No pedal edema. Gastrointestinal system: Abdomen is nondistended, soft and nontender. No organomegaly or masses felt. Normal bowel sounds heard. Central nervous system: Alert and oriented  x3. No focal neurological deficits. Extremities: Symmetric 5 x 5 power. Skin: No rashes, lesions or ulcers Psychiatry: Judgement and insight appear normal. Mood & affect appropriate.    Condition at discharge: fair  The results of significant diagnostics from this hospitalization (including imaging, microbiology, ancillary and laboratory) are listed below for reference.   Imaging Studies: DG Abd 1 View  Result Date: 06/17/2022 CLINICAL DATA:  Abdominal distention. EXAM: ABDOMEN - 1 VIEW COMPARISON:  Chest x-ray dated June 15, 2022. CT abdomen pelvis dated May 21, 2022. FINDINGS: The bowel gas pattern is normal. No radio-opaque calculi or other significant radiographic abnormality are seen. No acute osseous abnormality. Prior L4 and L5 cement augmentation. Pulmonary edema seems improved. Unchanged small bilateral pleural effusions. IMPRESSION: 1. No acute findings. Electronically Signed   By: Obie Dredge M.D.   On: 06/17/2022 14:59   ECHOCARDIOGRAM COMPLETE  Result Date: 06/16/2022    ECHOCARDIOGRAM REPORT   Patient Name:   DUSAN LIPFORD [facility] Date of Exam:  06/16/2022 Medical Rec #:  [id number]          Height:       68.0 in Accession #:    [id number]         Weight:       190.5 lb Date of Birth:  10-26-1935          BSA:          2.002 m Patient Age:    86 years           BP:           150/75 mmHg Patient Gender: M                  HR:           69 bpm. Exam Location:  [facility] Procedure: 2D Echo, Cardiac Doppler, Color Doppler and Intracardiac            Opacification Agent Indications:     CHF  History:         Patient has no prior history of Echocardiogram examinations.                  CHF, CAD; Risk Factors:Hypertension, Diabetes and Dyslipidemia.  Sonographer:     Mikki Harbor Referring Phys:  [id number] Verdene Lennert Diagnosing Phys: Lorine Bears MD  Sonographer Comments: Suboptimal parasternal window and Technically difficult study due to poor echo windows. Image acquisition challenging due to respiratory motion. IMPRESSIONS  1. Left ventricular ejection fraction, by estimation, is 40 to 45%. The left ventricle has mildly decreased function. The left ventricle demonstrates regional wall motion abnormalities (see scoring diagram/findings for description). Left ventricular diastolic parameters are indeterminate. There is severe hypokinesis of the left ventricular, mid-apical anterior wall and apical segment.  2. Right ventricular systolic function is normal. The right ventricular size is normal.  3. Left atrial size was mildly dilated.  4. The mitral valve is normal in structure. No evidence of mitral valve regurgitation. No evidence of mitral stenosis.  5. The aortic valve is normal in structure. Aortic valve regurgitation is not visualized. No aortic stenosis is present.  6. The inferior vena cava is normal in size with <50% respiratory variability, suggesting right atrial pressure of 8 mmHg. FINDINGS  Left Ventricle: Left ventricular ejection fraction, by estimation, is 40 to 45%. The left ventricle has mildly decreased function. The left ventricle demonstrates  regional wall motion abnormalities. Severe hypokinesis of the left ventricular, mid-apical  anterior wall and  apical segment. Definity contrast agent was given IV to delineate the left ventricular endocardial borders. The left ventricular internal cavity size was normal in size. There is no left ventricular hypertrophy. Left ventricular diastolic parameters are indeterminate. Right Ventricle: The right ventricular size is normal. No increase in right ventricular wall thickness. Right ventricular systolic function is normal. Left Atrium: Left atrial size was mildly dilated. Right Atrium: Right atrial size was normal in size. Pericardium: There is no evidence of pericardial effusion. Mitral Valve: The mitral valve is normal in structure. No evidence of mitral valve regurgitation. No evidence of mitral valve stenosis. MV peak gradient, 5.6 mmHg. The mean mitral valve gradient is 2.0 mmHg. Tricuspid Valve: The tricuspid valve is normal in structure. Tricuspid valve regurgitation is not demonstrated. No evidence of tricuspid stenosis. Aortic Valve: The aortic valve is normal in structure. Aortic valve regurgitation is not visualized. No aortic stenosis is present. Aortic valve mean gradient measures 4.0 mmHg. Aortic valve peak gradient measures 7.0 mmHg. Aortic valve area, by VTI measures 2.50 cm. Pulmonic Valve: The pulmonic valve was normal in structure. Pulmonic valve regurgitation is not visualized. No evidence of pulmonic stenosis. Aorta: The aortic root is normal in size and structure. Venous: The inferior vena cava is normal in size with less than 50% respiratory variability, suggesting right atrial pressure of 8 mmHg. IAS/Shunts: No atrial level shunt detected by color flow Doppler.  LEFT VENTRICLE PLAX 2D LVIDd:         5.60 cm   Diastology LVIDs:         3.70 cm   LV e' medial:    6.42 cm/s LV PW:         1.20 cm   LV E/e' medial:  16.2 LV IVS:        1.10 cm   LV e' lateral:   9.68 cm/s LVOT diam:     2.00 cm    LV E/e' lateral: 10.7 LV SV:         85 LV SV Index:   42 LVOT Area:     3.14 cm  RIGHT VENTRICLE RV Basal diam:  3.90 cm RV Mid diam:    3.40 cm LEFT ATRIUM             Index        RIGHT ATRIUM           Index LA diam:        4.10 cm 2.05 cm/m   RA Area:     19.40 cm LA Vol (A2C):   73.6 ml 36.77 ml/m  RA Volume:   50.00 ml  24.98 ml/m LA Vol (A4C):   71.7 ml 35.82 ml/m LA Biplane Vol: 77.8 ml 38.86 ml/m  AORTIC VALVE AV Area (Vmax):    2.57 cm AV Area (Vmean):   2.32 cm AV Area (VTI):     2.50 cm AV Vmax:           132.00 cm/s AV Vmean:          95.300 cm/s AV VTI:            0.338 m AV Peak Grad:      7.0 mmHg AV Mean Grad:      4.0 mmHg LVOT Vmax:         108.00 cm/s LVOT Vmean:        70.300 cm/s LVOT VTI:          0.269 m LVOT/AV VTI ratio: 0.80  AORTA Ao Root diam: 3.70 cm MITRAL VALVE MV Area (PHT): 4.74 cm     SHUNTS MV Area VTI:   2.39 cm     Systemic VTI:  0.27 m MV Peak grad:  5.6 mmHg     Systemic Diam: 2.00 cm MV Mean grad:  2.0 mmHg MV Vmax:       1.18 m/s MV Vmean:      61.4 cm/s MV Decel Time: 160 msec MV E velocity: 104.00 cm/s MV A velocity: 100.00 cm/s MV E/A ratio:  1.04 Lorine Bears MD Electronically signed by Lorine Bears MD Signature Date/Time: 06/16/2022/5:43:40 PM    Final    DG Chest Port 1 View  Result Date: 06/15/2022 CLINICAL DATA:  Increased shortness of breath. EXAM: PORTABLE CHEST 1 VIEW COMPARISON:  Radiograph 05/21/2022. Chest CT 06/12/2022 FINDINGS: The heart is enlarged. Bilateral pleural effusions, right greater than left. Developing right left perihilar opacity. Worsening pulmonary edema with increasing septal thickening and diffuse pulmonary opacity. No pneumothorax. IMPRESSION: 1. CHF with cardiomegaly, increasing pulmonary edema, and bilateral pleural effusions. 2. Developing right perihilar opacity, may be asymmetric pulmonary edema or pneumonia. Electronically Signed   By: Narda Rutherford M.D.   On: 06/15/2022 13:46   CT Angio Chest PE W and/or Wo  Contrast  Result Date: 06/12/2022 CLINICAL DATA:  Short of breath, productive cough EXAM: CT ANGIOGRAPHY CHEST WITH CONTRAST TECHNIQUE: Multidetector CT imaging of the chest was performed using the standard protocol during bolus administration of intravenous contrast. Multiplanar CT image reconstructions and MIPs were obtained to evaluate the vascular anatomy. RADIATION DOSE REDUCTION: This exam was performed according to the departmental dose-optimization program which includes automated exposure control, adjustment of the mA and/or kV according to patient size and/or use of iterative reconstruction technique. CONTRAST:  75mL OMNIPAQUE IOHEXOL 350 MG/ML SOLN COMPARISON:  05/21/2022 FINDINGS: Cardiovascular: This is a technically adequate evaluation of the pulmonary vasculature. No filling defects or pulmonary emboli. The heart is unremarkable without pericardial effusion. There is dense atherosclerosis of the coronary vasculature greatest in the LAD and right coronary distribution. No evidence of thoracic aortic aneurysm or dissection. Stable atherosclerosis of the aorta. Mediastinum/Nodes: Stable appearance of the thyroid. Mucoid material layers dependently within the bilateral mainstem bronchi. The esophagus is unremarkable. Mild bilateral axillary lymphadenopathy has progressed since prior study. Index lymph node adjacent to the right subclavian vein reference image 39/4 measures 11 mm, previously measuring 8 mm. Multiple subcentimeter mediastinal and hilar lymph nodes are again noted. Lungs/Pleura: Interval development of bilateral pleural effusions, volume estimated less than 1 L each. Multifocal bilateral ground-glass airspace disease has progressed, right greater than left. There is bilateral bronchial wall thickening. No pneumothorax. Upper Abdomen: No acute abnormality. Musculoskeletal: No acute or destructive bony lesions. Reconstructed images demonstrate no additional findings. Review of the MIP images  confirms the above findings. IMPRESSION: 1. No evidence of pulmonary embolus. 2. Progressive multifocal bilateral airspace disease and bronchial wall thickening, consistent with worsening pneumonia. 3. Progressive mediastinal, hilar, and axillary lymphadenopathy, likely reactive. 4. Bilateral pleural effusions, new since prior exam, volume estimated less than 1 L each. 5. Aortic Atherosclerosis (ICD10-I70.0). Coronary artery atherosclerosis. Electronically Signed   By: Sharlet Salina M.D.   On: 06/12/2022 16:09   CT CHEST ABDOMEN PELVIS WO CONTRAST  Result Date: 05/21/2022 CLINICAL DATA:  Sepsis, fever, hyperglycemia EXAM: CT CHEST, ABDOMEN AND PELVIS WITHOUT CONTRAST TECHNIQUE: Multidetector CT imaging of the chest, abdomen and pelvis was performed following the standard protocol without IV contrast. RADIATION DOSE  REDUCTION: This exam was performed according to the departmental dose-optimization program which includes automated exposure control, adjustment of the mA and/or kV according to patient size and/or use of iterative reconstruction technique. COMPARISON:  None Available. FINDINGS: CT CHEST FINDINGS Cardiovascular: Extensive multi-vessel coronary artery calcification. Cardiac size within normal limits. No pericardial effusion. Central pulmonary arteries are of normal caliber. Extensive atherosclerotic calcification within the thoracic aorta. Mediastinum/Nodes: The visualized thyroid is unremarkable. No pathologic thoracic adenopathy. The distal esophagus appears mildly circumferentially thick walled small amount of fluid is seen layering within the mid esophagus which may reflect changes of esophagitis in the setting of gastroesophageal reflux or esophageal dysmotility. This is not well assessed on this examination. Lungs/Pleura: Reticulonodular infiltrates have progressed within the lung bases bilaterally in keeping with atypical infection or aspiration. Trace superimposed interstitial pulmonary edema  and bilateral pleural effusions are present. No pneumothorax. No central obstructing lesion. Bronchial wall thickening in keeping with airway inflammation noted. Musculoskeletal: No acute bone abnormality. No lytic or blastic bone lesion. CT ABDOMEN PELVIS FINDINGS Hepatobiliary: No focal liver abnormality is seen. No gallstones, gallbladder wall thickening, or biliary dilatation. Pancreas: Unremarkable Spleen: Unremarkable Adrenals/Urinary Tract: The adrenal glands are unremarkable. The kidneys are normal in size and position. Vascular calcifications noted within the renal hila bilaterally. No definite urinary renal or ureteral calculi. The bladder is distended but is otherwise unremarkable. Stomach/Bowel: Moderate colonic stool burden without evidence of obstruction. Stomach, small bowel, and large bowel are otherwise unremarkable. Appendix normal. No free intraperitoneal gas or fluid. Vascular/Lymphatic: Extensive aortoiliac atherosclerotic calcification. Particularly prominent atherosclerotic calcification noted at the origin of the superior mesenteric artery, however, the degree of stenosis is not well assessed on this noncontrast examination. Extensive calcification also noted within the lower extremity arterial inflow bilaterally. No aortic aneurysm. No pathologic adenopathy within the abdomen and pelvis. Reproductive: Moderate prostatic hypertrophy. Other: No abdominal wall hernia. Musculoskeletal: L4 and L5 vertebral augmentation has been performed. Osseous structures are otherwise age-appropriate. No acute bone abnormality. No lytic or blastic bone lesion. IMPRESSION: 1. Progressive reticulonodular infiltrates within the lung bases bilaterally in keeping with atypical infection or aspiration. 2. Trace superimposed interstitial pulmonary edema and bilateral pleural effusions. 3. Extensive multi-vessel coronary artery calcification. 4. Mild circumferentially thick walled distal esophagus which may reflect  changes of esophagitis in the setting of gastroesophageal reflux or esophageal dysmotility. Correlation with endoscopy may be helpful for further evaluation. 5. Moderate colonic stool burden without evidence of obstruction. 6. Extensive aortoiliac atherosclerotic calcification. Particularly prominent atherosclerotic calcification at the origin of the superior mesenteric artery, however, the degree of stenosis is not well assessed on this noncontrast examination. If there is clinical evidence of chronic mesenteric ischemia, CT arteriography may be helpful for further evaluation. 7. Moderate prostatic hypertrophy. Aortic Atherosclerosis (ICD10-I70.0). Electronically Signed   By: Helyn Numbers M.D.   On: 05/21/2022 23:00   CT HEAD WO CONTRAST ( )  Result Date: 05/21/2022 CLINICAL DATA:  Headache. EXAM: CT HEAD WITHOUT CONTRAST TECHNIQUE: Contiguous axial images were obtained from the base of the skull through the vertex without intravenous contrast. RADIATION DOSE REDUCTION: This exam was performed according to the departmental dose-optimization program which includes automated exposure control, adjustment of the mA and/or kV according to patient size and/or use of iterative reconstruction technique. COMPARISON:  None Available. FINDINGS: Brain: There is periventricular white matter decreased attenuation consistent with small vessel ischemic changes. Ventricles, sulci and cisterns are prominent consistent with age related involutional changes. No acute intracranial hemorrhage, mass effect or  shift. No hydrocephalus. Vascular: No hyperdense vessel or unexpected calcification. Skull: Normal. Negative for fracture or focal lesion. Sinuses/Orbits: No acute finding. IMPRESSION: Atrophy and chronic small vessel ischemic changes. No acute intracranial process identified. Electronically Signed   By: Layla Maw M.D.   On: 05/21/2022 22:08   DG Chest Port 1 View  Result Date: 05/21/2022 CLINICAL DATA:  Sepsis,  hyperglycemia, febrile EXAM: PORTABLE CHEST 1 VIEW COMPARISON:  04/03/2022 FINDINGS: Single frontal view of the chest demonstrates an unremarkable cardiac silhouette. There is diffuse increased interstitial prominence, with patchy bibasilar airspace disease. No effusion or pneumothorax. No acute bony abnormalities. IMPRESSION: 1. Diffuse interstitial prominence with patchy bibasilar airspace disease, which may reflect multifocal pneumonia or edema. Electronically Signed   By: Sharlet Salina M.D.   On: 05/21/2022 21:40    Microbiology: Results for orders placed or performed during the hospital encounter of 06/12/22  Resp Panel by RT-PCR (Flu A&B, Covid) Anterior Nasal Swab     Status: None   Collection Time: 06/12/22  3:01 PM   Specimen: Anterior Nasal Swab  Result Value Ref Range Status   SARS Coronavirus 2 by RT PCR NEGATIVE NEGATIVE Final    Comment: (NOTE) SARS-CoV-2 target nucleic acids are NOT DETECTED.  The SARS-CoV-2 RNA is generally detectable in upper respiratory specimens during the acute phase of infection. The lowest concentration of SARS-CoV-2 viral copies this assay can detect is 138 copies/mL. A negative result does not preclude SARS-Cov-2 infection and should not be used as the sole basis for treatment or other patient management decisions. A negative result may occur with  improper specimen collection/handling, submission of specimen other than nasopharyngeal swab, presence of viral mutation(s) within the areas targeted by this assay, and inadequate number of viral copies(<138 copies/mL). A negative result must be combined with clinical observations, patient history, and epidemiological information. The expected result is Negative.  Fact Sheet for Patients:  BloggerCourse.com  Fact Sheet for Healthcare Providers:  SeriousBroker.it  This test is no t yet approved or cleared by the [place] FDA and  has been  authorized for detection and/or diagnosis of SARS-CoV-2 by FDA under an Emergency Use Authorization (EUA). This EUA will remain  in effect (meaning this test can be used) for the duration of the COVID-19 declaration under Section 564(b)(1) of the Act, 21 U.S.C.section 360bbb-3(b)(1), unless the authorization is terminated  or revoked sooner.       Influenza A by PCR NEGATIVE NEGATIVE Final   Influenza B by PCR NEGATIVE NEGATIVE Final    Comment: (NOTE) The Xpert Xpress SARS-CoV-2/FLU/RSV plus assay is intended as an aid in the diagnosis of influenza from Nasopharyngeal swab specimens and should not be used as a sole basis for treatment. Nasal washings and aspirates are unacceptable for Xpert Xpress SARS-CoV-2/FLU/RSV testing.  Fact Sheet for Patients: BloggerCourse.com  Fact Sheet for Healthcare Providers: SeriousBroker.it  This test is not yet approved or cleared by the [place] FDA and has been authorized for detection and/or diagnosis of SARS-CoV-2 by FDA under an Emergency Use Authorization (EUA). This EUA will remain in effect (meaning this test can be used) for the duration of the COVID-19 declaration under Section 564(b)(1) of the Act, 21 U.S.C. section 360bbb-3(b)(1), unless the authorization is terminated or revoked.  Performed at [facility], [address] [place], [place] [address]   Blood culture (single)     Status: None   Collection Time: 06/12/22  3:01 PM   Specimen: BLOOD  Result Value Ref  Range Status   Specimen Description BLOOD BLOOD LEFT HAND  Final   Special Requests   Final    BOTTLES DRAWN AEROBIC AND ANAEROBIC Blood Culture adequate volume   Culture   Final    NO GROWTH 5 DAYS Performed at [facility], [address] [place], [place] [address]    Report Status 06/17/2022 FINAL  Final  Culture, blood (single)     Status: None   Collection Time: 06/12/22  3:10 PM    Specimen: BLOOD  Result Value Ref Range Status   Specimen Description BLOOD LEFT ANTECUBITAL  Final   Special Requests BOTTLES DRAWN AEROBIC AND ANAEROBIC BCLV  Final   Culture   Final    NO GROWTH 5 DAYS Performed at [facility], [address] [place], [place] [address]    Report Status 06/17/2022 FINAL  Final    Labs: CBC: Recent Labs  Lab 06/13/22 0405 06/14/22 0311 06/15/22 0307 06/16/22 0511 06/17/22 0554 06/18/22 0343 06/19/22 0542  WBC 8.7   < > 5.7 6.8 8.0 6.5 6.2  NEUTROABS 7.2  --   --   --   --   --   --   HGB 8.8*   < > 7.5* 8.5* 10.4* 9.3* 10.0*  HCT 28.6*   < > 25.2* 27.7* 33.9* 30.6* 32.5*  MCV 94.1   < > 93.3 92.6 92.9 92.4 92.3  PLT 393   < > 274 262 337 284 304   < > = values in this interval not displayed.   Basic Metabolic Panel: Recent Labs  Lab 06/15/22 0307 06/16/22 0511 06/17/22 0554 06/18/22 0343 06/19/22 0542  NA 137 140 141 139 142  K 3.9 3.6 3.8 4.3 4.1  CL 106 103 98 99 99  CO2 26 27 33* 32 32  GLUCOSE 366* 222* 186* 248* 211*  BUN 24* 20 21 31* 37*  CREATININE 1.76* 1.31* 1.17 1.32* 1.50*  CALCIUM 8.1* 8.2* 8.7* 8.5* 8.8*  MG 2.0 1.9 2.0 1.8 2.6*   Liver Function Tests: Recent Labs  Lab 06/15/22 0307 06/16/22 0511  AST 20 27  ALT 20 19  ALKPHOS 54 54  BILITOT 0.6 0.8  PROT 6.2* 6.1*  ALBUMIN 2.8* 2.7*   CBG: Recent Labs  Lab 06/18/22 0748 06/18/22 1120 06/18/22 1608 06/18/22 2048 06/19/22 0817  GLUCAP 224* 253* 251* 321* 233*    Discharge time spent: greater than 30 minutes.  Signed: Marrion Coy, MD [facility] 06/19/2022

## 2022-06-19 NOTE — Progress Notes (Addendum)
[facility] CLINIC CARDIOLOGY CONSULT NOTE       Patient ID: Gregory Tran MRN: [id number] DOB/AGE: June 02, 1935 87 y.o.  Admit date: 06/12/2022 Referring Physician Dr. Joycelyn Das  Primary Physician Lenon Oms, NP  Primary Cardiologist previous Dr. Lady Gary  Reason for Consultation new HF  HPI: [name]. Every is an 74yoM with a PMH of CAD s/p RCA and LAD stents (patent by [facility] 2018), DM2, HTN, chronic anemia, hx TIA with recent admission for sepsis secondary to PNA who presented to [facility] ED 06/12/2022 with shortness of breath while working with physical therapy.  There was initial concern for pneumonia based on CTA chest findings, but also had significant peripheral edema with concern for heart failure contributing to his symptoms.  Cardiology is consulted for hospital day 4 for assistance with his CHF. Echo resulted with reduced EF 40-45%, severe HK of LV mid apical anterior wall segment and apical segment   Interval History: - seen with son at bedside this AM  - patient feels ok, fatigued. No chest pain, shortness of breath, or cough  - diuresed well. Cr slightly up from yesterday and baseline.   Review of systems complete and found to be negative unless listed above     Past Medical History:  Diagnosis Date   Arthritis    BPH (benign prostatic hyperplasia)    Cancer (HCC)    Basal Cell   Chronic kidney disease    Coronary artery disease    Diabetes mellitus (HCC)    Ejaculatory disorder    Erectile dysfunction    Frequency    GERD (gastroesophageal reflux disease)    Gross hematuria    Heart disease    Hematuria    HTN (hypertension)    Hyperlipidemia    Incomplete bladder emptying    Microscopic hematuria    Myocardial infarction (HCC)    Neuropathy    Nocturia    Rotator cuff tear left   TIA (transient ischemic attack)    Tick bite of multiple sites 5 days ago   chest, and groin area   Vertigo    1-2x/yr    Past Surgical History:  Procedure Laterality  Date   cardiac stents     CATARACT EXTRACTION W/PHACO Left 08/02/2018   Procedure: CATARACT EXTRACTION PHACO AND INTRAOCULAR LENS PLACEMENT (IOC)  LEFT DIABETIC;  Surgeon: Nevada Crane, MD;  Location: [facility];  Service: Ophthalmology;  Laterality: Left;  diabetes - insulin and oral meds   CATARACT EXTRACTION W/PHACO Right 10/11/2018   Procedure: CATARACT EXTRACTION PHACO AND INTRAOCULAR LENS PLACEMENT (IOC) RIGHT DIABETES;  Surgeon: Nevada Crane, MD;  Location: [facility];  Service: Ophthalmology;  Laterality: Right;  Diabetic - insulin and oral meds   CORONARY ANGIOPLASTY  2001, 2008   [facility]   DUPUYTREN CONTRACTURE RELEASE Right 04/30/2016   Procedure: DUPUYTREN CONTRACTURE RELEASE;  Surgeon: Erin Sons, MD;  Location: [facility] ORS;  Service: Orthopedics;  Laterality: Right;   KYPHOPLASTY N/A 01/26/2018   Procedure: Pearletha Furl;  Surgeon: Kennedy Bucker, MD;  Location: [facility] OR[facility];  Service: Orthopedics;  Laterality: N/A;   ROTATOR CUFF REPAIR Bilateral    TRANSURETHRAL RESECTION OF PROSTATE N/A 07/09/2015   Procedure: TRANSURETHRAL RESECTION OF THE PROSTATE (TURP);  Surgeon: Vanna Scotland, MD;  Location: [facility] OR[facility];  Service: Urology;  Laterality: N/A;    Medications Prior to Admission  Medication Sig Dispense Refill Last Dose   amLODipine (NORVASC) 10 MG tablet Take 5 mg by mouth daily.   06/12/2022  ammonium lactate (AMLACTIN) 12 % cream Apply 1 Application topically 2 (two) times daily. (Apply to back)   Past Week   aspirin EC 81 MG tablet Take 81 mg by mouth daily.    06/12/2022   diphenhydrAMINE (BENADRYL) 25 mg capsule Take 1 capsule (25 mg total) by mouth every 6 (six) hours as needed for itching, sleep or allergies. 30 capsule 0 unknown   gabapentin (NEURONTIN) 100 MG capsule Take 100 mg by mouth daily.   06/12/2022   insulin aspart (NOVOLOG) 100 UNIT/ML FlexPen Inject 3 Units into the skin 3 (three) times daily with meals. 15 mL 3 06/12/2022   insulin  degludec (TRESIBA) 100 UNIT/ML FlexTouch Pen Inject 10 Units into the skin daily.   06/12/2022   metFORMIN (GLUCOPHAGE) 1000 MG tablet Take 1,000 mg by mouth daily.   06/12/2022   metoprolol succinate (TOPROL-XL) 25 MG 24 hr tablet Take 12.5 mg by mouth daily.   06/12/2022   pantoprazole (PROTONIX) 40 MG tablet Take 40 mg by mouth daily.    06/12/2022   rosuvastatin (CRESTOR) 5 MG tablet Take 5 mg by mouth every other day.   1 Past Week   tamsulosin (FLOMAX) 0.4 MG CAPS capsule TAKE 1 CAPSULE(0.4 MG) BY MOUTH DAILY (Patient taking differently: Take 0.4 mg by mouth daily.) 30 capsule 3    Social History   Socioeconomic History   Marital status: Single    Spouse name: Not on file   Number of children: Not on file   Years of education: Not on file   Highest education level: Not on file  Occupational History   Not on file  Tobacco Use   Smoking status: Former    Packs/day: 1.00    Years: 50.00    Additional pack years: 0.00    Total pack years: 50.00    Types: Cigarettes    Quit date: 06/26/2017    Years since quitting: 4.9   Smokeless tobacco: Never  Vaping Use   Vaping Use: Never used  Substance and Sexual Activity   Alcohol use: No    Alcohol/week: 0.0 standard drinks of alcohol   Drug use: No   Sexual activity: Not on file  Other Topics Concern   Not on file  Social History Narrative   Not on file   Social Determinants of Health   Financial Resource Strain: Not on file  Food Insecurity: No Food Insecurity (05/22/2022)   Hunger Vital Sign    Worried About Running Out of Food in the Last Year: Never true    Ran Out of Food in the Last Year: Never true  Transportation Needs: No Transportation Needs (05/22/2022)   PRAPARE - Administrator, Civil Service (Medical): No    Lack of Transportation (Non-Medical): No  Physical Activity: Not on file  Stress: Not on file  Social Connections: Not on file  Intimate Partner Violence: Not At Risk (05/22/2022)   Humiliation,  Afraid, Rape, and Kick questionnaire    Fear of Current or Ex-Partner: No    Emotionally Abused: No    Physically Abused: No    Sexually Abused: No    Family History  Problem Relation Age of Onset   Ovarian cancer Sister    Kidney disease Brother        born one kidney   Bladder Cancer Neg Hx    Prostate cancer Neg Hx       Intake/Output Summary (Last 24 hours) at 06/19/2022 1610 Last data filed at  06/19/2022 0612 Gross per 24 hour  Intake 720 ml  Output 2100 ml  Net -1380 ml     Vitals:   06/19/22 0000 06/19/22 0611 06/19/22 0611 06/19/22 0816  BP: (!) 128/51  (!) 145/58 (!) 145/55  Pulse: 63  71 61  Resp: 16  20 16   Temp: 98.6 F (37 C)  98.1 F (36.7 C) 98.2 F (36.8 C)  TempSrc:      SpO2: 100%  97% 93%  Weight:  76.3 kg    Height:        PHYSICAL EXAM General: Elderly and frail-appearing Caucasian male, in no acute distress.  Laying at low incline in bed with son present HEENT:  Normocephalic and atraumatic. Neck:  No JVD.  Lungs: Normal respiratory effort on 3L [place]. Decreased breath sounds, no wheezing or crack;es Heart: HRRR . Normal S1 and S2 without gallops or murmurs.  Abdomen: Non-distended appearing.  Msk: Normal strength and tone for age. Extremities: Warm and well perfused. No clubbing, cyanosis.  Much improved bilateral lower extremity edema with skin dimpling and wrinkling.  Neuro: somnolent, but awakens to voice briefly Psych:  Answers questions appropriately, but is a limited historian   Labs: Basic Metabolic Panel: Recent Labs    06/18/22 0343 06/19/22 0542  NA 139 142  K 4.3 4.1  CL 99 99  CO2 32 32  GLUCOSE 248* 211*  BUN 31* 37*  CREATININE 1.32* 1.50*  CALCIUM 8.5* 8.8*  MG 1.8 2.6*    Liver Function Tests: No results for input(s): "AST", "ALT", "ALKPHOS", "BILITOT", "PROT", "ALBUMIN" in the last 72 hours.  No results for input(s): "LIPASE", "AMYLASE" in the last 72 hours. CBC: Recent Labs    06/18/22 0343 06/19/22 0542   WBC 6.5 6.2  HGB 9.3* 10.0*  HCT 30.6* 32.5*  MCV 92.4 92.3  PLT 284 304    Cardiac Enzymes: No results for input(s): "CKTOTAL", "CKMB", "CKMBINDEX", "TROPONINIHS" in the last 72 hours. BNP: Recent Labs    06/17/22 0554  BNP 1,369.4*    D-Dimer: No results for input(s): "DDIMER" in the last 72 hours. Hemoglobin A1C: No results for input(s): "HGBA1C" in the last 72 hours. Fasting Lipid Panel: No results for input(s): "CHOL", "HDL", "LDLCALC", "TRIG", "CHOLHDL", "LDLDIRECT" in the last 72 hours. Thyroid Function Tests: No results for input(s): "TSH", "T4TOTAL", "T3FREE", "THYROIDAB" in the last 72 hours.  Invalid input(s): "FREET3" Anemia Panel: Recent Labs    06/18/22 0902 06/18/22 0903  VITAMINB12  --  222  FERRITIN 16*  --   TIBC 291  --   IRON 100  --      Radiology: DG Abd 1 View  Result Date: 06/17/2022 CLINICAL DATA:  Abdominal distention. EXAM: ABDOMEN - 1 VIEW COMPARISON:  Chest x-ray dated June 15, 2022. CT abdomen pelvis dated May 21, 2022. FINDINGS: The bowel gas pattern is normal. No radio-opaque calculi or other significant radiographic abnormality are seen. No acute osseous abnormality. Prior L4 and L5 cement augmentation. Pulmonary edema seems improved. Unchanged small bilateral pleural effusions. IMPRESSION: 1. No acute findings. Electronically Signed   By: Obie Dredge M.D.   On: 06/17/2022 14:59   ECHOCARDIOGRAM COMPLETE  Result Date: 06/16/2022    ECHOCARDIOGRAM REPORT   Patient Name:   Gregory Tran [facility] Date of Exam: 06/16/2022 Medical Rec #:  [id number]          Height:       68.0 in Accession #:    [id number]  Weight:       190.5 lb Date of Birth:  Jul 14, 1935          BSA:          2.002 m Patient Age:    86 years           BP:           150/75 mmHg Patient Gender: M                  HR:           69 bpm. Exam Location:  [facility] Procedure: 2D Echo, Cardiac Doppler, Color Doppler and Intracardiac            Opacification Agent Indications:      CHF  History:         Patient has no prior history of Echocardiogram examinations.                  CHF, CAD; Risk Factors:Hypertension, Diabetes and Dyslipidemia.  Sonographer:     Mikki Harbor Referring Phys:  [id number] Verdene Lennert Diagnosing Phys: Lorine Bears MD  Sonographer Comments: Suboptimal parasternal window and Technically difficult study due to poor echo windows. Image acquisition challenging due to respiratory motion. IMPRESSIONS  1. Left ventricular ejection fraction, by estimation, is 40 to 45%. The left ventricle has mildly decreased function. The left ventricle demonstrates regional wall motion abnormalities (see scoring diagram/findings for description). Left ventricular diastolic parameters are indeterminate. There is severe hypokinesis of the left ventricular, mid-apical anterior wall and apical segment.  2. Right ventricular systolic function is normal. The right ventricular size is normal.  3. Left atrial size was mildly dilated.  4. The mitral valve is normal in structure. No evidence of mitral valve regurgitation. No evidence of mitral stenosis.  5. The aortic valve is normal in structure. Aortic valve regurgitation is not visualized. No aortic stenosis is present.  6. The inferior vena cava is normal in size with <50% respiratory variability, suggesting right atrial pressure of 8 mmHg. FINDINGS  Left Ventricle: Left ventricular ejection fraction, by estimation, is 40 to 45%. The left ventricle has mildly decreased function. The left ventricle demonstrates regional wall motion abnormalities. Severe hypokinesis of the left ventricular, mid-apical  anterior wall and apical segment. Definity contrast agent was given IV to delineate the left ventricular endocardial borders. The left ventricular internal cavity size was normal in size. There is no left ventricular hypertrophy. Left ventricular diastolic parameters are indeterminate. Right Ventricle: The right ventricular size is normal. No  increase in right ventricular wall thickness. Right ventricular systolic function is normal. Left Atrium: Left atrial size was mildly dilated. Right Atrium: Right atrial size was normal in size. Pericardium: There is no evidence of pericardial effusion. Mitral Valve: The mitral valve is normal in structure. No evidence of mitral valve regurgitation. No evidence of mitral valve stenosis. MV peak gradient, 5.6 mmHg. The mean mitral valve gradient is 2.0 mmHg. Tricuspid Valve: The tricuspid valve is normal in structure. Tricuspid valve regurgitation is not demonstrated. No evidence of tricuspid stenosis. Aortic Valve: The aortic valve is normal in structure. Aortic valve regurgitation is not visualized. No aortic stenosis is present. Aortic valve mean gradient measures 4.0 mmHg. Aortic valve peak gradient measures 7.0 mmHg. Aortic valve area, by VTI measures 2.50 cm. Pulmonic Valve: The pulmonic valve was normal in structure. Pulmonic valve regurgitation is not visualized. No evidence of pulmonic stenosis. Aorta: The aortic root is normal in size and structure.  Venous: The inferior vena cava is normal in size with less than 50% respiratory variability, suggesting right atrial pressure of 8 mmHg. IAS/Shunts: No atrial level shunt detected by color flow Doppler.  LEFT VENTRICLE PLAX 2D LVIDd:         5.60 cm   Diastology LVIDs:         3.70 cm   LV e' medial:    6.42 cm/s LV PW:         1.20 cm   LV E/e' medial:  16.2 LV IVS:        1.10 cm   LV e' lateral:   9.68 cm/s LVOT diam:     2.00 cm   LV E/e' lateral: 10.7 LV SV:         85 LV SV Index:   42 LVOT Area:     3.14 cm  RIGHT VENTRICLE RV Basal diam:  3.90 cm RV Mid diam:    3.40 cm LEFT ATRIUM             Index        RIGHT ATRIUM           Index LA diam:        4.10 cm 2.05 cm/m   RA Area:     19.40 cm LA Vol (A2C):   73.6 ml 36.77 ml/m  RA Volume:   50.00 ml  24.98 ml/m LA Vol (A4C):   71.7 ml 35.82 ml/m LA Biplane Vol: 77.8 ml 38.86 ml/m  AORTIC VALVE AV  Area (Vmax):    2.57 cm AV Area (Vmean):   2.32 cm AV Area (VTI):     2.50 cm AV Vmax:           132.00 cm/s AV Vmean:          95.300 cm/s AV VTI:            0.338 m AV Peak Grad:      7.0 mmHg AV Mean Grad:      4.0 mmHg LVOT Vmax:         108.00 cm/s LVOT Vmean:        70.300 cm/s LVOT VTI:          0.269 m LVOT/AV VTI ratio: 0.80  AORTA Ao Root diam: 3.70 cm MITRAL VALVE MV Area (PHT): 4.74 cm     SHUNTS MV Area VTI:   2.39 cm     Systemic VTI:  0.27 m MV Peak grad:  5.6 mmHg     Systemic Diam: 2.00 cm MV Mean grad:  2.0 mmHg MV Vmax:       1.18 m/s MV Vmean:      61.4 cm/s MV Decel Time: 160 msec MV E velocity: 104.00 cm/s MV A velocity: 100.00 cm/s MV E/A ratio:  1.04 Lorine Bears MD Electronically signed by Lorine Bears MD Signature Date/Time: 06/16/2022/5:43:40 PM    Final    DG Chest Port 1 View  Result Date: 06/15/2022 CLINICAL DATA:  Increased shortness of breath. EXAM: PORTABLE CHEST 1 VIEW COMPARISON:  Radiograph 05/21/2022. Chest CT 06/12/2022 FINDINGS: The heart is enlarged. Bilateral pleural effusions, right greater than left. Developing right left perihilar opacity. Worsening pulmonary edema with increasing septal thickening and diffuse pulmonary opacity. No pneumothorax. IMPRESSION: 1. CHF with cardiomegaly, increasing pulmonary edema, and bilateral pleural effusions. 2. Developing right perihilar opacity, may be asymmetric pulmonary edema or pneumonia. Electronically Signed   By: Narda Rutherford M.D.   On: 06/15/2022 13:46  CT Angio Chest PE W and/or Wo Contrast  Result Date: 06/12/2022 CLINICAL DATA:  Short of breath, productive cough EXAM: CT ANGIOGRAPHY CHEST WITH CONTRAST TECHNIQUE: Multidetector CT imaging of the chest was performed using the standard protocol during bolus administration of intravenous contrast. Multiplanar CT image reconstructions and MIPs were obtained to evaluate the vascular anatomy. RADIATION DOSE REDUCTION: This exam was performed according to the  departmental dose-optimization program which includes automated exposure control, adjustment of the mA and/or kV according to patient size and/or use of iterative reconstruction technique. CONTRAST:  75mL OMNIPAQUE IOHEXOL 350 MG/ML SOLN COMPARISON:  05/21/2022 FINDINGS: Cardiovascular: This is a technically adequate evaluation of the pulmonary vasculature. No filling defects or pulmonary emboli. The heart is unremarkable without pericardial effusion. There is dense atherosclerosis of the coronary vasculature greatest in the LAD and right coronary distribution. No evidence of thoracic aortic aneurysm or dissection. Stable atherosclerosis of the aorta. Mediastinum/Nodes: Stable appearance of the thyroid. Mucoid material layers dependently within the bilateral mainstem bronchi. The esophagus is unremarkable. Mild bilateral axillary lymphadenopathy has progressed since prior study. Index lymph node adjacent to the right subclavian vein reference image 39/4 measures 11 mm, previously measuring 8 mm. Multiple subcentimeter mediastinal and hilar lymph nodes are again noted. Lungs/Pleura: Interval development of bilateral pleural effusions, volume estimated less than 1 L each. Multifocal bilateral ground-glass airspace disease has progressed, right greater than left. There is bilateral bronchial wall thickening. No pneumothorax. Upper Abdomen: No acute abnormality. Musculoskeletal: No acute or destructive bony lesions. Reconstructed images demonstrate no additional findings. Review of the MIP images confirms the above findings. IMPRESSION: 1. No evidence of pulmonary embolus. 2. Progressive multifocal bilateral airspace disease and bronchial wall thickening, consistent with worsening pneumonia. 3. Progressive mediastinal, hilar, and axillary lymphadenopathy, likely reactive. 4. Bilateral pleural effusions, new since prior exam, volume estimated less than 1 L each. 5. Aortic Atherosclerosis (ICD10-I70.0). Coronary artery  atherosclerosis. Electronically Signed   By: Sharlet Salina M.D.   On: 06/12/2022 16:09   CT CHEST ABDOMEN PELVIS WO CONTRAST  Result Date: 05/21/2022 CLINICAL DATA:  Sepsis, fever, hyperglycemia EXAM: CT CHEST, ABDOMEN AND PELVIS WITHOUT CONTRAST TECHNIQUE: Multidetector CT imaging of the chest, abdomen and pelvis was performed following the standard protocol without IV contrast. RADIATION DOSE REDUCTION: This exam was performed according to the departmental dose-optimization program which includes automated exposure control, adjustment of the mA and/or kV according to patient size and/or use of iterative reconstruction technique. COMPARISON:  None Available. FINDINGS: CT CHEST FINDINGS Cardiovascular: Extensive multi-vessel coronary artery calcification. Cardiac size within normal limits. No pericardial effusion. Central pulmonary arteries are of normal caliber. Extensive atherosclerotic calcification within the thoracic aorta. Mediastinum/Nodes: The visualized thyroid is unremarkable. No pathologic thoracic adenopathy. The distal esophagus appears mildly circumferentially thick walled small amount of fluid is seen layering within the mid esophagus which may reflect changes of esophagitis in the setting of gastroesophageal reflux or esophageal dysmotility. This is not well assessed on this examination. Lungs/Pleura: Reticulonodular infiltrates have progressed within the lung bases bilaterally in keeping with atypical infection or aspiration. Trace superimposed interstitial pulmonary edema and bilateral pleural effusions are present. No pneumothorax. No central obstructing lesion. Bronchial wall thickening in keeping with airway inflammation noted. Musculoskeletal: No acute bone abnormality. No lytic or blastic bone lesion. CT ABDOMEN PELVIS FINDINGS Hepatobiliary: No focal liver abnormality is seen. No gallstones, gallbladder wall thickening, or biliary dilatation. Pancreas: Unremarkable Spleen: Unremarkable  Adrenals/Urinary Tract: The adrenal glands are unremarkable. The kidneys are normal in size  and position. Vascular calcifications noted within the renal hila bilaterally. No definite urinary renal or ureteral calculi. The bladder is distended but is otherwise unremarkable. Stomach/Bowel: Moderate colonic stool burden without evidence of obstruction. Stomach, small bowel, and large bowel are otherwise unremarkable. Appendix normal. No free intraperitoneal gas or fluid. Vascular/Lymphatic: Extensive aortoiliac atherosclerotic calcification. Particularly prominent atherosclerotic calcification noted at the origin of the superior mesenteric artery, however, the degree of stenosis is not well assessed on this noncontrast examination. Extensive calcification also noted within the lower extremity arterial inflow bilaterally. No aortic aneurysm. No pathologic adenopathy within the abdomen and pelvis. Reproductive: Moderate prostatic hypertrophy. Other: No abdominal wall hernia. Musculoskeletal: L4 and L5 vertebral augmentation has been performed. Osseous structures are otherwise age-appropriate. No acute bone abnormality. No lytic or blastic bone lesion. IMPRESSION: 1. Progressive reticulonodular infiltrates within the lung bases bilaterally in keeping with atypical infection or aspiration. 2. Trace superimposed interstitial pulmonary edema and bilateral pleural effusions. 3. Extensive multi-vessel coronary artery calcification. 4. Mild circumferentially thick walled distal esophagus which may reflect changes of esophagitis in the setting of gastroesophageal reflux or esophageal dysmotility. Correlation with endoscopy may be helpful for further evaluation. 5. Moderate colonic stool burden without evidence of obstruction. 6. Extensive aortoiliac atherosclerotic calcification. Particularly prominent atherosclerotic calcification at the origin of the superior mesenteric artery, however, the degree of stenosis is not well  assessed on this noncontrast examination. If there is clinical evidence of chronic mesenteric ischemia, CT arteriography may be helpful for further evaluation. 7. Moderate prostatic hypertrophy. Aortic Atherosclerosis (ICD10-I70.0). Electronically Signed   By: Helyn Numbers M.D.   On: 05/21/2022 23:00   CT HEAD WO CONTRAST ( )  Result Date: 05/21/2022 CLINICAL DATA:  Headache. EXAM: CT HEAD WITHOUT CONTRAST TECHNIQUE: Contiguous axial images were obtained from the base of the skull through the vertex without intravenous contrast. RADIATION DOSE REDUCTION: This exam was performed according to the departmental dose-optimization program which includes automated exposure control, adjustment of the mA and/or kV according to patient size and/or use of iterative reconstruction technique. COMPARISON:  None Available. FINDINGS: Brain: There is periventricular white matter decreased attenuation consistent with small vessel ischemic changes. Ventricles, sulci and cisterns are prominent consistent with age related involutional changes. No acute intracranial hemorrhage, mass effect or shift. No hydrocephalus. Vascular: No hyperdense vessel or unexpected calcification. Skull: Normal. Negative for fracture or focal lesion. Sinuses/Orbits: No acute finding. IMPRESSION: Atrophy and chronic small vessel ischemic changes. No acute intracranial process identified. Electronically Signed   By: Layla Maw M.D.   On: 05/21/2022 22:08   DG Chest Port 1 View  Result Date: 05/21/2022 CLINICAL DATA:  Sepsis, hyperglycemia, febrile EXAM: PORTABLE CHEST 1 VIEW COMPARISON:  04/03/2022 FINDINGS: Single frontal view of the chest demonstrates an unremarkable cardiac silhouette. There is diffuse increased interstitial prominence, with patchy bibasilar airspace disease. No effusion or pneumothorax. No acute bony abnormalities. IMPRESSION: 1. Diffuse interstitial prominence with patchy bibasilar airspace disease, which may reflect  multifocal pneumonia or edema. Electronically Signed   By: Sharlet Salina M.D.   On: 05/21/2022 21:40    ECHO EF >55% 2018   [facility] 01/2017 FINDINGS   Coronary Angiography  Dominance: Right   Left Main:  The left main coronary artery (LMCA) is a large-caliber vessel  that originates from the left coronary sinus. It bifurcates into the left  anterior descending (LAD) and left circumflex (LCx) arteries. There is a  20% stenosis in the mid LMCA.   LAD:  The LAD  is a large-caliber vessel that gives off 2 diagonal (D)  branches before it wraps around the apex. D1 is a moderate-to-large  caliber vessel without significant disease. D2 is a small-caliber vessel.  There is patent stent in the mid LAD with mild to moderate in-stent  restenosis most notably in the proximal portion.  There is a 60% stenosis  distal to LAD stent   Left Circumflex:  The LCx is a large-caliber vessel that gives off 2  obtuse marginal (OM) branches and then continues as a small vessel in the  AV groove. OM1 is a small-caliber vessel with a 50-60% ostial stenosis.  OM2 is a large-caliber branching vessel with diffuse luminal  irregularities. There are diffuse luminal irregularities throughout the  LCx.   Right Coronary:  The right coronary artery (RCA) is a large-caliber vessel  originating from the right coronary sinus. It bifurcates distally into the  posterior descending artery (PDA) and 4 posterolateral (PL) branches  consistent with a right dominant system. The PDA is a moderate caliber  vesse. PL1 and PL4 are both moderate caliber vessells. PL2 and PL3 are  small vessels. There a long diffuse segment of disease in the proximal to  mid RCA with stenosis up to 60% with patient stent in the mid to distal  RCA. Mild in-stent restenosis is noted.   TELEMETRY reviewed by me (LT) 06/19/2022 : Sinus rhythm with PACs, rate 60s-70s with motion artifact  EKG reviewed by me: NSR rate 94 bpm, artifact  Data  reviewed by me (LT) 06/19/2022: hospitalist progress note, last 24h vitals tele labs imaging I/O   Principal Problem:   Acute heart failure (HCC) Active Problems:   Coronary artery disease   Uncontrolled type 2 diabetes mellitus with hyperglycemia, with long-term current use of insulin (HCC)   Chronic anemia   CKD stage 3a, GFR 45-59 ml/min (HCC)   Type 2 diabetes mellitus with peripheral neuropathy (HCC)   Acute hypoxemic respiratory failure (HCC)   Acute on chronic systolic CHF (congestive heart failure) (HCC)    ASSESSMENT AND PLAN:  Gregory Tran is an 66yoM with a PMH of CAD s/p RCA and LAD stents (patent by [facility] 2018), DM2, HTN, chronic anemia, hx TIA with recent admission for sepsis secondary to PNA who presented to [facility] ED [facility]/12/2022 with shortness of breath while working with physical therapy.  There was initial concern for pneumonia based on CTA chest findings, but also had significant peripheral edema with concern for heart failure contributing to his symptoms.  Cardiology is consulted for hospital day 4 for assistance with his CHF.  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  Initial concern for recurrent pneumonia, but presentation was felt to be more c/w a heart failure exacerbation (WBCs wnl, neg procal, no fever. BNP uptrending from 747 on admission to 1300 4/30 despite brisk diuresis. Clinical improvement in volume status after diuresis. Net IO Since Admission: -11,380.33 mL [06/19/22 0834].  -Wean oxygen as tolerated, currently on 3L today  -s/p IV diuretics and started torsemide 20mg  daily yesterday AM. Cr with slight bump - hold torsemide for 2 days, then restart.  -continue GDMT with metoprolol XL 12.5mg  daily, losartan 25mg  daily and farxiga 10mg  daily -consider MRA as BP and renal function allow  -EF is  reduced compared to most recent study 6 years ago - suspect gradual decline with known  underlying CAD.  -defer additional cardiac diagnostics at this time - need to mobilize / OOB as tolerated, he was reluctant to attempts at mobility  - salt and fluid restriction - will arrange for follow up with Dr. Darrold Junker 1-2 weeks after discharge.   # AKI on CKD 3 Renal function overall ok, slight bump in Cr. Currently BUN/Cr 37/1.5 GFR 46  -Monitor and replenish electrolytes for a goal K >4  # CAD s/p RCA and LAD stents  Patent stents by [facility] 2018. Chest pain free.  Continue aspirin 81 mg daily, and Crestor 5 mg every other day  This patient's plan of care was discussed and created with Dr. Juliann Pares and he is in agreement.  Signed: Rebeca Allegra , PA-C 06/19/2022, 8:34 AM [facility]

## 2022-06-20 LAB — HOMOCYSTEINE: Homocysteine: 30.5 umol/L — ABNORMAL HIGH (ref 0.0–21.3)

## 2022-07-03 ENCOUNTER — Encounter: Payer: Self-pay | Admitting: Family

## 2022-07-03 ENCOUNTER — Ambulatory Visit: Payer: Medicare Other | Attending: Family | Admitting: Family

## 2022-07-03 ENCOUNTER — Other Ambulatory Visit (HOSPITAL_COMMUNITY): Payer: Self-pay

## 2022-07-03 ENCOUNTER — Telehealth (HOSPITAL_COMMUNITY): Payer: Self-pay

## 2022-07-03 VITALS — BP 109/55 | HR 65 | Wt 160.2 lb

## 2022-07-03 DIAGNOSIS — I2510 Atherosclerotic heart disease of native coronary artery without angina pectoris: Secondary | ICD-10-CM | POA: Insufficient documentation

## 2022-07-03 DIAGNOSIS — Z79899 Other long term (current) drug therapy: Secondary | ICD-10-CM | POA: Diagnosis not present

## 2022-07-03 DIAGNOSIS — Z8673 Personal history of transient ischemic attack (TIA), and cerebral infarction without residual deficits: Secondary | ICD-10-CM | POA: Insufficient documentation

## 2022-07-03 DIAGNOSIS — D649 Anemia, unspecified: Secondary | ICD-10-CM | POA: Diagnosis not present

## 2022-07-03 DIAGNOSIS — I10 Essential (primary) hypertension: Secondary | ICD-10-CM

## 2022-07-03 DIAGNOSIS — Z794 Long term (current) use of insulin: Secondary | ICD-10-CM | POA: Diagnosis not present

## 2022-07-03 DIAGNOSIS — I5022 Chronic systolic (congestive) heart failure: Secondary | ICD-10-CM | POA: Insufficient documentation

## 2022-07-03 DIAGNOSIS — K219 Gastro-esophageal reflux disease without esophagitis: Secondary | ICD-10-CM | POA: Insufficient documentation

## 2022-07-03 DIAGNOSIS — Z87891 Personal history of nicotine dependence: Secondary | ICD-10-CM | POA: Insufficient documentation

## 2022-07-03 DIAGNOSIS — E1140 Type 2 diabetes mellitus with diabetic neuropathy, unspecified: Secondary | ICD-10-CM | POA: Insufficient documentation

## 2022-07-03 DIAGNOSIS — E1122 Type 2 diabetes mellitus with diabetic chronic kidney disease: Secondary | ICD-10-CM | POA: Diagnosis not present

## 2022-07-03 DIAGNOSIS — M1990 Unspecified osteoarthritis, unspecified site: Secondary | ICD-10-CM | POA: Diagnosis not present

## 2022-07-03 DIAGNOSIS — E1142 Type 2 diabetes mellitus with diabetic polyneuropathy: Secondary | ICD-10-CM

## 2022-07-03 DIAGNOSIS — Z7984 Long term (current) use of oral hypoglycemic drugs: Secondary | ICD-10-CM | POA: Insufficient documentation

## 2022-07-03 DIAGNOSIS — I130 Hypertensive heart and chronic kidney disease with heart failure and stage 1 through stage 4 chronic kidney disease, or unspecified chronic kidney disease: Secondary | ICD-10-CM | POA: Insufficient documentation

## 2022-07-03 DIAGNOSIS — Z955 Presence of coronary angioplasty implant and graft: Secondary | ICD-10-CM | POA: Insufficient documentation

## 2022-07-03 DIAGNOSIS — N400 Enlarged prostate without lower urinary tract symptoms: Secondary | ICD-10-CM | POA: Diagnosis not present

## 2022-07-03 DIAGNOSIS — I252 Old myocardial infarction: Secondary | ICD-10-CM | POA: Diagnosis not present

## 2022-07-03 DIAGNOSIS — N189 Chronic kidney disease, unspecified: Secondary | ICD-10-CM | POA: Diagnosis not present

## 2022-07-03 NOTE — Progress Notes (Signed)
[facility] HEART FAILURE CLINIC - Pharmacist Note  Gregory Tran is a 87 y.o. male with HFmrEF (EF 41-49%) presenting to the Heart Failure Clinic to establish care. Patient presents today with his son. Son is Radio broadcast assistant of his medications. Son reports Gregory Tran copay >$200 for 90d supply. They were able to pick this up but they inquire about options to reduce copay in the future. Patient advocate notified. No other barriers reported. Patient reports no issues on current regimen and no signs or symptoms of volume overload. He does report some wounds on his feet and burning of feet. This is preventing him from being as active as he would like to be. Son reports that patient is adherent to fluid restriction.  Recent ED Visit (past 6 months):  Date: 06/12/2022, CC: SOB Date: 05/21/2022, CC: hyperglycemia and AMS Date: 05/05/2022, CC: coffee ground emesis Daet: 04/03/2022, CC: fall  Guideline-Directed Medical Therapy/Evidence Based Medicine ACE/ARB/ARNI: Losartan 25 mg daily Beta Blocker: Metoprolol succinate 25 mg daily Aldosterone Antagonist:  none Diuretic: Torsemide 40 mg daily SGLT2i: Dapagliflozin 10 mg daily  Adherence Assessment Do you ever forget to take your medication? [] Yes [x] No  Do you ever skip doses due to side effects? [] Yes [x] No  Do you have trouble affording your medicines? [] Yes [x] No  Are you ever unable to pick up your medication due to transportation difficulties? [] Yes [x] No  Do you ever stop taking your medications because you don't believe they are helping? [] Yes [x] No  Do you check your weight daily? [x] Yes [] No  Adherence strategy: son manages medications Barriers to obtaining medications: likely in donut hole  Diagnostics ECHO: Date 06/16/2022, EF 40-45%, RWMA  Vitals    07/03/2022    3:26 PM 06/19/2022    8:16 AM 06/19/2022    6:11 AM  Vitals with BMI  Weight 160 lbs 3 oz  168 lbs 3 oz  BMI 24.36  25.58  Systolic 109 145 409  Diastolic 55 55 58  Pulse  65 61 71     Recent Labs    Latest Ref Rng & Units 06/19/2022    5:42 AM 06/18/2022    3:43 AM 06/17/2022    5:54 AM  BMP  Glucose 70 - 99 mg/dL 811  914  782   BUN 8 - 23 mg/dL 37  31  21   Creatinine 0.61 - 1.24 mg/dL 9.56  2.13  0.86   Sodium 135 - 145 mmol/L 142  139  141   Potassium 3.5 - 5.1 mmol/L 4.1  4.3  3.8   Chloride 98 - 111 mmol/L 99  99  98   CO2 22 - 32 mmol/L 32  32  33   Calcium 8.9 - 10.3 mg/dL 8.8  8.5  8.7     Past Medical History Past Medical History:  Diagnosis Date   Arthritis    BPH (benign prostatic hyperplasia)    Cancer (HCC)    Basal Cell   CHF (congestive heart failure) (HCC)    Chronic kidney disease    Coronary artery disease    Diabetes mellitus (HCC)    Ejaculatory disorder    Erectile dysfunction    Frequency    GERD (gastroesophageal reflux disease)    Gross hematuria    Heart disease    Hematuria    HTN (hypertension)    Hyperlipidemia    Incomplete bladder emptying    Microscopic hematuria    Myocardial infarction (HCC)    Neuropathy    Nocturia  Rotator cuff tear left   TIA (transient ischemic attack)    Tick bite of multiple sites 5 days ago   chest, and groin area   Vertigo    1-2x/yr    Plan Continue regimen as directed by NP Follow up with patient advocate for patient assistance Consider addition of MRA at future visit as BP allows  Time spent: 15 minutes  Celene Squibb, PharmD PGY1 Pharmacy Resident 07/03/2022 4:07 PM

## 2022-07-03 NOTE — Telephone Encounter (Signed)
Advanced Heart Failure Patient Advocate Encounter   The patient was conditionally approved for a grant via [employer]. Patient will receive a conditional approval letter within 5-7 business days and contact me with any additional concerns.   BIN: [id number] PCN: [id number] ID: [id number]   Burnell Blanks, CPhT Rx Patient Advocate Phone: [phone]

## 2022-07-03 NOTE — Progress Notes (Signed)
Advanced Heart Failure Clinic Note    PCP: Gregory Buddy, NP (last seen 05/24) PCP-Cardiologist: Gregory Millard, MD (to be seen 07/08/22)  HPI:  [name] is a 87 y/o male with a history of CAD (MI), DM, DES, anemia, TIA, hyperlipidemia, HTN, CKD, arthritis, BPH, GERD, previous tobacco use and chronic heart failure.   Echo 06/13/22: EF 40-45% along with severe hypokinesis of LV & mild LAE.  LHC 01/2017 @ [facility]: Left Main:  The left main coronary artery (LMCA) is a large-caliber vessel  that originates from the left coronary sinus. It bifurcates into the left  anterior descending (LAD) and left circumflex (LCx) arteries. There is a  20% stenosis in the mid LMCA.  LAD:  The LAD is a large-caliber vessel that gives off 2 diagonal (D)  branches before it wraps around the apex. D1 is a moderate-to-large  caliber vessel without significant disease. D2 is a small-caliber vessel.  There is patent stent in the mid LAD with mild to moderate in-stent  restenosis most notably in the proximal portion.  There is a 60% stenosis  distal to LAD stent  Left Circumflex:  The LCx is a large-caliber vessel that gives off 2  obtuse marginal (OM) branches and then continues as a small vessel in the  AV groove. OM1 is a small-caliber vessel with a 50-60% ostial stenosis.  OM2 is a large-caliber branching vessel with diffuse luminal  irregularities. There are diffuse luminal irregularities throughout the  LCx.  Right Coronary:  The right coronary artery (RCA) is a large-caliber vessel  originating from the right coronary sinus. It bifurcates distally into the  posterior descending artery (PDA) and 4 posterolateral (PL) branches  consistent with a right dominant system. The PDA is a moderate caliber  vesse. PL1 and PL4 are both moderate caliber vessells. PL2 and PL3 are  small vessels. There a long diffuse segment of disease in the proximal to  mid RCA with stenosis up to 60% with patient  stent in the mid to distal  RCA. Mild in-stent restenosis is noted.    Admitted 06/12/22 due to SOB with hypoxia and worsening lower extremity edema. CTA negative for PE But showed worsening bilateral lymphadenopathy pneumonia and effusion. Given I unit PRBC's, diuresed and placed on bipap. Lasix subsequently held due to worsening renal function.   He presents today for his initial visit with a chief complaint of minimal fatigue with moderate exertion. Chronic in nature. Has associated dizziness, open wounds on the bottom of his feet and tingling/ burning in his feet along with this. Has had recent biopsy of skin on his feet by dermatology and they are waiting on the results from that. Has a phone call to his PCP office regarding possibly going up on his gabapentin.   Son that is present manages patient's medications, reads labels for sodium content and is keeping his daily fluid intake <2L. Working on a grant due to patient being close to the doughnut hole. Son says that the plan is to move patient to [place] to live with him.  Review of Systems: [y] = yes, [ ]  = no   General: Weight gain [ ] ; Weight loss [ ] ; Anorexia [ ] ; Fatigue [place]ienne ]; Fever [ ] ; Chills [ ] ; Weakness [ ]   Cardiac: Chest pain/pressure [ ] ; Resting SOB [ ] ; Exertional SOB [ ] ; Orthopnea [ ] ; Pedal Edema [ ] ; Palpitations [ ] ; Syncope [ ] ; Presyncope [ ] ; Paroxysmal nocturnal dyspnea[ ]   Pulmonary: Cough [ ] ;  Wheezing[ ] ; Hemoptysis[ ] ; Sputum [ ] ; Snoring [ ]   GI: Vomiting[ ] ; Dysphagia[ ] ; Melena[ ] ; Hematochezia [ ] ; Heartburn[ ] ; Abdominal pain [ ] ; Constipation [ ] ; Diarrhea [ ] ; BRBPR [ ]   GU: Hematuria[ ] ; Dysuria [ ] ; Nocturia[ ]   Vascular: Pain in legs with walking [ ] ; Pain in feet with lying flat [ ] ; Non-healing sores [ ] ; Stroke [ ] ; TIA [ ] ; Slurred speech [ ] ;  Neuro: Headaches[ ] ; Vertigo[ ] ; Seizures[ ] ; Paresthesias[ ] ;Blurred vision [ ] ; Diplopia [ ] ; Vision changes [ ]   Ortho/Skin: Arthritis [ ] ; Joint pain [ ] ;  Muscle pain [ ] ; Joint swelling [ ] ; Back Pain [ ] ; Rash [ ]   Psych: Depression[ ] ; Anxiety[ ]   Heme: Bleeding problems [ ] ; Clotting disorders [ ] ; Anemia [ ]   Endocrine: Diabetes [ y]; Thyroid dysfunction[ ]    Past Medical History:  Diagnosis Date   Arthritis    BPH (benign prostatic hyperplasia)    Cancer (HCC)    Basal Cell   Chronic kidney disease    Coronary artery disease    Diabetes mellitus (HCC)    Ejaculatory disorder    Erectile dysfunction    Frequency    GERD (gastroesophageal reflux disease)    Gross hematuria    Heart disease    Hematuria    HTN (hypertension)    Hyperlipidemia    Incomplete bladder emptying    Microscopic hematuria    Myocardial infarction (HCC)    Neuropathy    Nocturia    Rotator cuff tear left   TIA (transient ischemic attack)    Tick bite of multiple sites 5 days ago   chest, and groin area   Vertigo    1-2x/yr    Current Outpatient Medications  Medication Sig Dispense Refill   ammonium lactate (AMLACTIN) 12 % cream Apply 1 Application topically 2 (two) times daily. (Apply to back)     aspirin EC 81 MG tablet Take 81 mg by mouth daily.      cyanocobalamin 1000 MCG tablet Take 1 tablet (1,000 mcg total) by mouth daily. 30 tablet 0   dapagliflozin propanediol (FARXIGA) 10 MG TABS tablet Take 1 tablet (10 mg total) by mouth daily. 90 tablet 0   diphenhydrAMINE (BENADRYL) 25 mg capsule Take 1 capsule (25 mg total) by mouth every 6 (six) hours as needed for itching, sleep or allergies. 30 capsule 0   gabapentin (NEURONTIN) 100 MG capsule Take 100 mg by mouth daily.     insulin aspart (NOVOLOG) 100 UNIT/ML FlexPen Inject 3 Units into the skin 3 (three) times daily with meals. 15 mL 3   insulin degludec (TRESIBA) 100 UNIT/ML FlexTouch Pen Inject 18 Units into the skin daily.     Ipratropium-Albuterol (COMBIVENT RESPIMAT) 20-100 MCG/ACT AERS respimat Inhale 1 puff into the lungs every 6 (six) hours. 4 g 0   losartan (COZAAR) 25 MG tablet  Take 1 tablet (25 mg total) by mouth daily. 30 tablet 0   metFORMIN (GLUCOPHAGE) 1000 MG tablet Take 1,000 mg by mouth daily.     metoprolol succinate (TOPROL-XL) 25 MG 24 hr tablet Take 12.5 mg by mouth daily.     pantoprazole (PROTONIX) 40 MG tablet Take 40 mg by mouth daily.      rosuvastatin (CRESTOR) 5 MG tablet Take 5 mg by mouth every other day.   1   tamsulosin (FLOMAX) 0.4 MG CAPS capsule TAKE 1 CAPSULE(0.4 MG) BY MOUTH DAILY (Patient taking differently: Take 0.4  mg by mouth daily.) 30 capsule 3   Torsemide 40 MG TABS Take 40 mg by mouth daily. 30 tablet 0   No current facility-administered medications for this visit.    Allergies  Allergen Reactions   Atorvastatin Other (See Comments)   B Complex-Folic Acid Hives   Social History   Socioeconomic History   Marital status: Single    Spouse name: Not on file   Number of children: Not on file   Years of education: Not on file   Highest education level: Not on file  Occupational History   Not on file  Tobacco Use   Smoking status: Former    Packs/day: 1.00    Years: 50.00    Additional pack years: 0.00    Total pack years: 50.00    Types: Cigarettes    Quit date: 06/26/2017    Years since quitting: 5.0   Smokeless tobacco: Never  Vaping Use   Vaping Use: Never used  Substance and Sexual Activity   Alcohol use: No    Alcohol/week: 0.0 standard drinks of alcohol   Drug use: No   Sexual activity: Not on file  Other Topics Concern   Not on file  Social History Narrative   Not on file   Social Determinants of Health   Financial Resource Strain: Not on file  Food Insecurity: No Food Insecurity (05/22/2022)   Hunger Vital Sign    Worried About Running Out of Food in the Last Year: Never true    Ran Out of Food in the Last Year: Never true  Transportation Needs: No Transportation Needs (05/22/2022)   PRAPARE - Administrator, Civil Service (Medical): No    Lack of Transportation (Non-Medical): No   Physical Activity: Not on file  Stress: Not on file  Social Connections: Not on file  Intimate Partner Violence: Not At Risk (05/22/2022)   Humiliation, Afraid, Rape, and Kick questionnaire    Fear of Current or Ex-Partner: No    Emotionally Abused: No    Physically Abused: No    Sexually Abused: No     Family History  Problem Relation Age of Onset   Ovarian cancer Sister    Kidney disease Brother        born one kidney   Bladder Cancer Neg Hx    Prostate cancer Neg Hx    PHYSICAL EXAM: General:  Well appearing. No respiratory difficulty HEENT: normal Neck: supple. no JVD. No lymphadenopathy or thyromegaly appreciated. Cor: PMI nondisplaced. Regular rate & rhythm. No rubs, gallops or murmurs. Lungs: clear Abdomen: soft, nontender, nondistended. No hepatosplenomegaly. No bruits or masses.  Extremities: no cyanosis, clubbing, rash, edema. Self-reports open wounds on bottom of bilateral feet Neuro: alert & oriented x 3, cranial nerves grossly intact. moves all 4 extremities w/o difficulty. Affect pleasant.  ECG: not done  ASSESSMENT & PLAN:  1: Chronic ischemic heart failure with mildly reduced ejection fraction- - NYHA class II - euvolemic - weighing daily; reminded to call for an overnight weight gain of > 2 pounds or a weekly weight gain of > 5 pounds - Echo 06/13/22: EF 40-45% along with severe hypokinesis of LV & mild LAE. - continue farxiga 10mg  daily - continue losartan 25mg  daily - continue metoprolol succinate 25mg  daily - continue torsemide 40mg  daily - current BP will not allow for titration - not adding salt and son is reading food labels for sodium content so as to keep his daily sodium intake to 2000mg  -  BNP 06/17/22 was 1369.4 - PharmD reconciled meds w/ patient  2: HTN- - BP 109/55 - saw PCP (Gregory Tran) 05/24 - BMP 06/19/22 showed sodium 142, potassium 4.1, creatinine 1.5 & GFR 46  3: CAD- - s/p DES 2018 - to see cardiology (Gregory Tran) 07/08/22 - [facility]  01/2017 @ [facility]: Left Main:  The left main coronary artery (LMCA) is a large-caliber vessel  that originates from the left coronary sinus. It bifurcates into the left  anterior descending (LAD) and left circumflex (LCx) arteries. There is a  20% stenosis in the mid LMCA.  LAD:  The LAD is a large-caliber vessel that gives off 2 diagonal (D)  branches before it wraps around the apex. D1 is a moderate-to-large  caliber vessel without significant disease. D2 is a small-caliber vessel.  There is patent stent in the mid LAD with mild to moderate in-stent  restenosis most notably in the proximal portion.  There is a 60% stenosis  distal to LAD stent  Left Circumflex:  The LCx is a large-caliber vessel that gives off 2  obtuse marginal (OM) branches and then continues as a small vessel in the  AV groove. OM1 is a small-caliber vessel with a 50-60% ostial stenosis.  OM2 is a large-caliber branching vessel with diffuse luminal  irregularities. There are diffuse luminal irregularities throughout the  LCx.  Right Coronary:  The right coronary artery (RCA) is a large-caliber vessel  originating from the right coronary sinus. It bifurcates distally into the  posterior descending artery (PDA) and 4 posterolateral (PL) branches  consistent with a right dominant system. The PDA is a moderate caliber  vesse. PL1 and PL4 are both moderate caliber vessells. PL2 and PL3 are  small vessels. There a long diffuse segment of disease in the proximal to  mid RCA with stenosis up to 60% with patient stent in the mid to distal  RCA. Mild in-stent restenosis is noted.    4: DM- - managed by PCP - novolog and tresiba along with metformin 1000mg  daily - A1c 05/22/22 was 5.9%  Return in 1 month, sooner if needed.   Gregory Freeze, FNP 07/03/22

## 2022-08-11 ENCOUNTER — Encounter: Payer: Self-pay | Admitting: Family

## 2022-08-11 ENCOUNTER — Ambulatory Visit: Payer: Medicare Other | Attending: Family | Admitting: Family

## 2022-08-11 VITALS — BP 123/55 | HR 52 | Wt 162.2 lb

## 2022-08-11 DIAGNOSIS — I5022 Chronic systolic (congestive) heart failure: Secondary | ICD-10-CM | POA: Insufficient documentation

## 2022-08-11 DIAGNOSIS — Z87891 Personal history of nicotine dependence: Secondary | ICD-10-CM | POA: Insufficient documentation

## 2022-08-11 DIAGNOSIS — I10 Essential (primary) hypertension: Secondary | ICD-10-CM

## 2022-08-11 DIAGNOSIS — Z7984 Long term (current) use of oral hypoglycemic drugs: Secondary | ICD-10-CM | POA: Diagnosis not present

## 2022-08-11 DIAGNOSIS — N189 Chronic kidney disease, unspecified: Secondary | ICD-10-CM | POA: Diagnosis not present

## 2022-08-11 DIAGNOSIS — I252 Old myocardial infarction: Secondary | ICD-10-CM | POA: Insufficient documentation

## 2022-08-11 DIAGNOSIS — Z955 Presence of coronary angioplasty implant and graft: Secondary | ICD-10-CM | POA: Insufficient documentation

## 2022-08-11 DIAGNOSIS — E1142 Type 2 diabetes mellitus with diabetic polyneuropathy: Secondary | ICD-10-CM | POA: Diagnosis not present

## 2022-08-11 DIAGNOSIS — I130 Hypertensive heart and chronic kidney disease with heart failure and stage 1 through stage 4 chronic kidney disease, or unspecified chronic kidney disease: Secondary | ICD-10-CM | POA: Insufficient documentation

## 2022-08-11 DIAGNOSIS — Z8673 Personal history of transient ischemic attack (TIA), and cerebral infarction without residual deficits: Secondary | ICD-10-CM | POA: Diagnosis not present

## 2022-08-11 DIAGNOSIS — I2510 Atherosclerotic heart disease of native coronary artery without angina pectoris: Secondary | ICD-10-CM | POA: Insufficient documentation

## 2022-08-11 DIAGNOSIS — E785 Hyperlipidemia, unspecified: Secondary | ICD-10-CM | POA: Diagnosis not present

## 2022-08-11 DIAGNOSIS — E1122 Type 2 diabetes mellitus with diabetic chronic kidney disease: Secondary | ICD-10-CM | POA: Insufficient documentation

## 2022-08-11 DIAGNOSIS — Z79899 Other long term (current) drug therapy: Secondary | ICD-10-CM | POA: Insufficient documentation

## 2022-08-11 DIAGNOSIS — Z794 Long term (current) use of insulin: Secondary | ICD-10-CM | POA: Insufficient documentation

## 2022-08-11 NOTE — Progress Notes (Signed)
PCP: Myrene Buddy, NP (last seen 06/24) Primary Cardiologist: Marcina Millard, MD (last seen 05/24)  HPI:  Mr Kolbe is a 87 y/o male with a history of CAD (MI), DM, DES, anemia, TIA, hyperlipidemia, HTN, CKD, arthritis, BPH, GERD, previous tobacco use and chronic heart failure.   Echo 06/13/22: EF 40-45% along with severe hypokinesis of LV & mild LAE.  LHC 01/2017 @ [facility]: Left Main:  The left main coronary artery (LMCA) is a large-caliber vessel  that originates from the left coronary sinus. It bifurcates into the left  anterior descending (LAD) and left circumflex (LCx) arteries. There is a  20% stenosis in the mid LMCA.  LAD:  The LAD is a large-caliber vessel that gives off 2 diagonal (D)  branches before it wraps around the apex. D1 is a moderate-to-large  caliber vessel without significant disease. D2 is a small-caliber vessel.  There is patent stent in the mid LAD with mild to moderate in-stent  restenosis most notably in the proximal portion.  There is a 60% stenosis  distal to LAD stent  Left Circumflex:  The LCx is a large-caliber vessel that gives off 2  obtuse marginal (OM) branches and then continues as a small vessel in the  AV groove. OM1 is a small-caliber vessel with a 50-60% ostial stenosis.  OM2 is a large-caliber branching vessel with diffuse luminal  irregularities. There are diffuse luminal irregularities throughout the  LCx.  Right Coronary:  The right coronary artery (RCA) is a large-caliber vessel  originating from the right coronary sinus. It bifurcates distally into the  posterior descending artery (PDA) and 4 posterolateral (PL) branches  consistent with a right dominant system. The PDA is a moderate caliber  vesse. PL1 and PL4 are both moderate caliber vessells. PL2 and PL3 are  small vessels. There a long diffuse segment of disease in the proximal to  mid RCA with stenosis up to 60% with patient stent in the mid to distal  RCA. Mild  in-stent restenosis is noted.    Admitted 06/12/22 due to SOB with hypoxia and worsening lower extremity edema. CTA negative for PE but showed worsening bilateral lymphadenopathy pneumonia and effusion. Given I unit PRBC's, diuresed and placed on bipap. Lasix subsequently held due to worsening renal function.   He presents today for a HF f/u visit with a chief complaint of minimal SOB upon moderate exertion. Chronic in nature. Has associated cough (improving), intermittent palpitations and occasional dizziness especially with sudden position changes. Denies chest pain, abdominal distention, pedal edema or difficulty sleeping.   Less scratching with every other week allergy injection.   Son that is present manages patient's medications, reads labels for sodium content and is keeping his daily fluid intake <2L. Son says that the plan is to move patient to [place] at some point to live with him.  ROS: All systems negative except as listed in HPI, PMH and Problem List.  SH:  Social History   Socioeconomic History   Marital status: Single    Spouse name: Not on file   Number of children: Not on file   Years of education: Not on file   Highest education level: Not on file  Occupational History   Not on file  Tobacco Use   Smoking status: Former    Packs/day: 1.00    Years: 50.00    Additional pack years: 0.00    Total pack years: 50.00    Types: Cigarettes    Quit date: 06/26/2017  Years since quitting: 5.1   Smokeless tobacco: Never  Vaping Use   Vaping Use: Never used  Substance and Sexual Activity   Alcohol use: No    Alcohol/week: 0.0 standard drinks of alcohol   Drug use: No   Sexual activity: Not on file  Other Topics Concern   Not on file  Social History Narrative   Not on file   Social Determinants of Health   Financial Resource Strain: Not on file  Food Insecurity: No Food Insecurity (05/22/2022)   Hunger Vital Sign    Worried About Running Out of Food in the Last  Year: Never true    Ran Out of Food in the Last Year: Never true  Transportation Needs: No Transportation Needs (05/22/2022)   PRAPARE - Administrator, Civil Service (Medical): No    Lack of Transportation (Non-Medical): No  Physical Activity: Not on file  Stress: Not on file  Social Connections: Not on file  Intimate Partner Violence: Not At Risk (05/22/2022)   Humiliation, Afraid, Rape, and Kick questionnaire    Fear of Current or Ex-Partner: No    Emotionally Abused: No    Physically Abused: No    Sexually Abused: No    FH:  Family History  Problem Relation Age of Onset   Ovarian cancer Sister    Kidney disease Brother        born one kidney   Bladder Cancer Neg Hx    Prostate cancer Neg Hx     Past Medical History:  Diagnosis Date   Anemia    Arthritis    BPH (benign prostatic hyperplasia)    Cancer (HCC)    Basal Cell   CHF (congestive heart failure) (HCC)    Chronic kidney disease    Coronary artery disease    Diabetes mellitus (HCC)    Ejaculatory disorder    Erectile dysfunction    Frequency    GERD (gastroesophageal reflux disease)    Gross hematuria    Heart disease    Hematuria    HTN (hypertension)    Hyperlipidemia    Incomplete bladder emptying    Microscopic hematuria    Myocardial infarction (HCC)    Neuropathy    Nocturia    Rotator cuff tear left   TIA (transient ischemic attack)    Tick bite of multiple sites 5 days ago   chest, and groin area   Vertigo    1-2x/yr    Current Outpatient Medications  Medication Sig Dispense Refill   ammonium lactate (AMLACTIN) 12 % cream Apply 1 Application topically 2 (two) times daily. (Apply to back)     aspirin EC 81 MG tablet Take 81 mg by mouth daily.      cyanocobalamin 1000 MCG tablet Take 1 tablet (1,000 mcg total) by mouth daily. 30 tablet 0   dapagliflozin propanediol (FARXIGA) 10 MG TABS tablet Take 1 tablet (10 mg total) by mouth daily. 90 tablet 0   gabapentin (NEURONTIN) 100 MG  capsule Take 100 mg by mouth daily.     insulin aspart (NOVOLOG) 100 UNIT/ML FlexPen Inject 3 Units into the skin 3 (three) times daily with meals. (Patient taking differently: Inject 8 Units into the skin 3 (three) times daily with meals.) 15 mL 3   insulin degludec (TRESIBA) 100 UNIT/ML FlexTouch Pen Inject 18 Units into the skin daily. (Patient taking differently: Inject 22 Units into the skin daily.)     Ipratropium-Albuterol (COMBIVENT RESPIMAT) 20-100 MCG/ACT AERS respimat  Inhale 1 puff into the lungs every 6 (six) hours. 4 g 0   losartan (COZAAR) 25 MG tablet Take 1 tablet (25 mg total) by mouth daily. 30 tablet 0   metoprolol succinate (TOPROL-XL) 25 MG 24 hr tablet Take 25 mg by mouth daily.     pantoprazole (PROTONIX) 40 MG tablet Take 40 mg by mouth daily.      rosuvastatin (CRESTOR) 5 MG tablet Take 5 mg by mouth every other day.   1   tamsulosin (FLOMAX) 0.4 MG CAPS capsule TAKE 1 CAPSULE(0.4 MG) BY MOUTH DAILY (Patient taking differently: Take 0.4 mg by mouth daily.) 30 capsule 3   Torsemide 40 MG TABS Take 40 mg by mouth daily. 30 tablet 0   No current facility-administered medications for this visit.   Vitals:   08/11/22 1311  BP: (!) 123/55  Pulse: (!) 52  SpO2: 99%  Weight: 162 lb 3.2 oz (73.6 kg)   Wt Readings from Last 3 Encounters:  08/11/22 162 lb 3.2 oz (73.6 kg)  07/03/22 160 lb 3.2 oz (72.7 kg)  06/19/22 168 lb 3.4 oz (76.3 kg)   Lab Results  Component Value Date   CREATININE 1.50 (H) 06/19/2022   CREATININE 1.32 (H) 06/18/2022   CREATININE 1.17 06/17/2022    PHYSICAL EXAM:  General:  Well appearing. No resp difficulty HEENT: normal Neck: supple. JVP flat.  Cardiac: regular rhythm, bradycardic. No rubs, gallops or murmurs. Lungs: clear Abdomen: soft, nontender, nondistended. No hepatosplenomegaly. No bruits or masses. Good bowel sounds. Extremities: no cyanosis, clubbing, rash, edema Neuro: alert & orientedx3, cranial nerves grossly intact. Moves all  4 extremities w/o difficulty. Affect pleasant.   ECG: 06/12/22 showed NSR   ASSESSMENT & PLAN:  1: Chronic ischemic heart failure with mildly reduced ejection fraction- - NYHA class II - euvolemic - weighing daily; reminded to call for an overnight weight gain of > 2 pounds or a weekly weight gain of > 5 pounds - weight up 2 pounds from last visit here 1 month ago - Echo 06/13/22: EF 40-45% along with severe hypokinesis of LV & mild LAE. - continue farxiga 10mg  daily - continue losartan 25mg  daily - continue metoprolol succinate 25mg  daily - continue torsemide 40mg  daily - BP better today although did have a recent fall and already experiences dizziness w/ sudden position changes so will not titrate anything at this time - not adding salt and son is reading food labels for sodium content so as to keep his daily sodium intake to 2000mg  - BNP 06/17/22 was 1369.4  2: HTN- - BP 123/55 - saw PCP Sampson Goon) 06/24 - BMP 06/19/22 showed sodium 142, potassium 4.1, creatinine 1.5 & GFR 46  3: CAD- - s/p DES 2018 - saw cardiology (Paraschos) 05/24 - LHC 01/2017 @ [facility]: Left Main:  The left main coronary artery (LMCA) is a large-caliber vessel  that originates from the left coronary sinus. It bifurcates into the left  anterior descending (LAD) and left circumflex (LCx) arteries. There is a  20% stenosis in the mid LMCA.  LAD:  The LAD is a large-caliber vessel that gives off 2 diagonal (D)  branches before it wraps around the apex. D1 is a moderate-to-large  caliber vessel without significant disease. D2 is a small-caliber vessel.  There is patent stent in the mid LAD with mild to moderate in-stent  restenosis most notably in the proximal portion.  There is a 60% stenosis  distal to LAD stent  Left Circumflex:  The  LCx is a large-caliber vessel that gives off 2  obtuse marginal (OM) branches and then continues as a small vessel in the  AV groove. OM1 is a small-caliber vessel with a  50-60% ostial stenosis.  OM2 is a large-caliber branching vessel with diffuse luminal  irregularities. There are diffuse luminal irregularities throughout the  LCx.  Right Coronary:  The right coronary artery (RCA) is a large-caliber vessel  originating from the right coronary sinus. It bifurcates distally into the  posterior descending artery (PDA) and 4 posterolateral (PL) branches  consistent with a right dominant system. The PDA is a moderate caliber  vesse. PL1 and PL4 are both moderate caliber vessells. PL2 and PL3 are  small vessels. There a long diffuse segment of disease in the proximal to  mid RCA with stenosis up to 60% with patient stent in the mid to distal  RCA. Mild in-stent restenosis is noted.    4: DM- - managed by PCP - novolog and tresiba along with metformin 1000mg  daily - A1c 05/22/22 was 5.9%  Return to HF clinic PRN, otherwise, follow closely with cardiology and PCP. Son and patient were both comfortable with this plan.

## 2022-08-11 NOTE — Patient Instructions (Addendum)
It was good to see you today! Be careful with this hot weather.     Call us in the future if you need [place]

## 2023-12-19 DEATH — deceased
